# Patient Record
Sex: Female | Born: 1935 | Race: White | Hispanic: No | Marital: Married | Smoking: Never smoker
Health system: Southern US, Community
[De-identification: ages and names within clinical notes are randomized; demographics above are authoritative.]

## PROBLEM LIST (undated history)

## (undated) DIAGNOSIS — L439 Lichen planus, unspecified: Secondary | ICD-10-CM

## (undated) DIAGNOSIS — R0789 Other chest pain: Secondary | ICD-10-CM

## (undated) DIAGNOSIS — M810 Age-related osteoporosis without current pathological fracture: Secondary | ICD-10-CM

## (undated) DIAGNOSIS — C4492 Squamous cell carcinoma of skin, unspecified: Secondary | ICD-10-CM

## (undated) DIAGNOSIS — I341 Nonrheumatic mitral (valve) prolapse: Secondary | ICD-10-CM

## (undated) HISTORY — PX: NOSE SURGERY: SHX723

## (undated) HISTORY — DX: Age-related osteoporosis without current pathological fracture: M81.0

## (undated) HISTORY — DX: Nonrheumatic mitral (valve) prolapse: I34.1

## (undated) HISTORY — DX: Other chest pain: R07.89

## (undated) HISTORY — DX: Lichen planus, unspecified: L43.9

## (undated) HISTORY — DX: Squamous cell carcinoma of skin, unspecified: C44.92

---

## 1998-07-07 ENCOUNTER — Other Ambulatory Visit: Admission: RE | Admit: 1998-07-07 | Discharge: 1998-07-07 | Payer: Self-pay | Admitting: Internal Medicine

## 1999-08-10 ENCOUNTER — Other Ambulatory Visit: Admission: RE | Admit: 1999-08-10 | Discharge: 1999-08-10 | Payer: Self-pay | Admitting: Internal Medicine

## 2003-12-21 ENCOUNTER — Ambulatory Visit: Payer: Self-pay | Admitting: Internal Medicine

## 2004-06-20 ENCOUNTER — Ambulatory Visit: Payer: Self-pay | Admitting: Internal Medicine

## 2004-07-18 ENCOUNTER — Ambulatory Visit: Payer: Self-pay | Admitting: Internal Medicine

## 2004-08-29 ENCOUNTER — Ambulatory Visit: Payer: Self-pay | Admitting: Internal Medicine

## 2004-09-26 ENCOUNTER — Ambulatory Visit: Payer: Self-pay | Admitting: Internal Medicine

## 2004-10-11 ENCOUNTER — Ambulatory Visit: Payer: Self-pay | Admitting: Cardiology

## 2004-10-17 ENCOUNTER — Ambulatory Visit: Payer: Self-pay | Admitting: Cardiology

## 2004-11-22 ENCOUNTER — Ambulatory Visit: Payer: Self-pay | Admitting: Internal Medicine

## 2004-11-23 ENCOUNTER — Ambulatory Visit: Payer: Self-pay | Admitting: Internal Medicine

## 2004-11-27 ENCOUNTER — Ambulatory Visit: Payer: Self-pay | Admitting: Internal Medicine

## 2004-11-27 ENCOUNTER — Ambulatory Visit (HOSPITAL_COMMUNITY): Admission: RE | Admit: 2004-11-27 | Discharge: 2004-11-27 | Payer: Self-pay | Admitting: Internal Medicine

## 2004-12-16 ENCOUNTER — Ambulatory Visit: Payer: Self-pay | Admitting: Internal Medicine

## 2005-01-01 ENCOUNTER — Ambulatory Visit: Payer: Self-pay | Admitting: Internal Medicine

## 2005-02-14 ENCOUNTER — Ambulatory Visit: Payer: Self-pay | Admitting: Internal Medicine

## 2005-02-26 ENCOUNTER — Encounter: Admission: RE | Admit: 2005-02-26 | Discharge: 2005-02-26 | Payer: Self-pay | Admitting: Internal Medicine

## 2005-02-26 ENCOUNTER — Ambulatory Visit: Payer: Self-pay | Admitting: Internal Medicine

## 2005-03-09 ENCOUNTER — Ambulatory Visit: Payer: Self-pay | Admitting: Internal Medicine

## 2005-03-13 ENCOUNTER — Ambulatory Visit: Payer: Self-pay | Admitting: Internal Medicine

## 2005-03-16 ENCOUNTER — Ambulatory Visit: Payer: Self-pay | Admitting: Internal Medicine

## 2005-03-21 ENCOUNTER — Ambulatory Visit: Payer: Self-pay | Admitting: Internal Medicine

## 2005-04-25 ENCOUNTER — Ambulatory Visit: Payer: Self-pay | Admitting: Internal Medicine

## 2005-05-04 ENCOUNTER — Ambulatory Visit: Payer: Self-pay | Admitting: Internal Medicine

## 2005-07-04 ENCOUNTER — Ambulatory Visit: Payer: Self-pay | Admitting: Internal Medicine

## 2005-07-30 ENCOUNTER — Ambulatory Visit: Payer: Self-pay | Admitting: Emergency Medicine

## 2005-10-05 ENCOUNTER — Ambulatory Visit: Payer: Self-pay | Admitting: Internal Medicine

## 2006-06-20 ENCOUNTER — Encounter: Admission: RE | Admit: 2006-06-20 | Discharge: 2006-06-20 | Payer: Self-pay | Admitting: Internal Medicine

## 2006-10-29 DIAGNOSIS — I498 Other specified cardiac arrhythmias: Secondary | ICD-10-CM

## 2006-10-29 DIAGNOSIS — M899 Disorder of bone, unspecified: Secondary | ICD-10-CM | POA: Insufficient documentation

## 2006-10-29 DIAGNOSIS — M949 Disorder of cartilage, unspecified: Secondary | ICD-10-CM

## 2006-10-29 DIAGNOSIS — I10 Essential (primary) hypertension: Secondary | ICD-10-CM | POA: Insufficient documentation

## 2006-10-29 DIAGNOSIS — R350 Frequency of micturition: Secondary | ICD-10-CM | POA: Insufficient documentation

## 2006-10-29 DIAGNOSIS — G43909 Migraine, unspecified, not intractable, without status migrainosus: Secondary | ICD-10-CM | POA: Insufficient documentation

## 2006-10-29 DIAGNOSIS — G4700 Insomnia, unspecified: Secondary | ICD-10-CM

## 2006-11-05 ENCOUNTER — Ambulatory Visit: Payer: Self-pay

## 2007-11-20 ENCOUNTER — Ambulatory Visit: Payer: Self-pay | Admitting: Cardiovascular Disease

## 2007-12-01 ENCOUNTER — Ambulatory Visit: Payer: Self-pay

## 2007-12-01 ENCOUNTER — Encounter: Payer: Self-pay | Admitting: Cardiovascular Disease

## 2010-06-20 NOTE — Assessment & Plan Note (Signed)
[facility]                            CARDIOLOGY OFFICE NOTE   Alison Rivera, Alison Rivera                        MRN:          [id number]  DATE:11/20/2007                            DOB:          1931-12-19    HISTORY OF PRESENT ILLNESS:  Alison Rivera is a 77 year old patient  previous seen by Dr. Daleen Rivera in 2006.  She was referred for chest pain.  Pain is atypical.  She has had it for months to years.  She had a  previous workup by Dr. Daleen Rivera.  Her last Myoview in September 2008 was  normal without ischemia or infarction.  This was a direct referral from  Dr. Sherryll Rivera.  Her previous Myoview in 2006 was also normal as was one in  2004.   The patient has a history of mitral valve prolapse.  This was documented  by echo in 2005.  There was trace MR, and there was only mild anterior  leaflet prolapse.   Chest tightness, this central and it is nonexertional and it is a  squeezing-type pain.  There is no associated shortness of breath or  diaphoresis.  It really has not changed much in character over the  years.   The patient does seem a bit anxious.   She also complains in review of systems of a 6 to 8 pound weight loss.   However, reviewing my records, she weighed about the same at 114 and 117  pounds as far back as September 2006.  Her appetite has been good.  She  has had no nausea and vomiting.  There has been no history of reflux.   PAST MEDICAL HISTORY:  Otherwise, remarkable for tonsil and  adenoidectomy.  She is postmenopausal, borderline hypertension, history  of PACs, and mitral valve prolapse.  Previous history of chest pain with  negative Myoview x3.  History of anxiety and depression.   ALLERGIES:  The patient denies any allergies.   MEDICATIONS:  She is currently taking  1. Toprol 25 a day.  2. Ambien.  3. Nasacort.  4. Calcium.  5. Magnesium.  6. Aspirin.   FAMILY HISTORY:  Mother died at age 16 of Alzheimer's.  Father died at  age 16 of  heart failure.   PAST SURGICAL HISTORY:  Her only previous surgeries included  tonsillectomy in 1945 and nasal surgery in 1990.  She is retired.  She  is married.  Her husband lives with her.  She has 5 grandchildren and 2  older children.  She walks on a regular basis.   PHYSICAL EXAMINATION:  GENERAL:  Remarkable for a thin elderly, white  female, in no distress.  VITAL SIGNS:  Weight is 114, blood pressure 120/70, pulse 60 and  regular, respiratory rate 14, afebrile.  HEENT:  Unremarkable.  NECK:  Carotids are normal without bruit.  No lymphadenopathy,  thyromegaly, or JVP elevation.  LUNGS:  Clear.  Good diaphragmatic motion.  No wheezing.  S1 and S2  without a click.  PMI normal.  ABDOMEN:  Benign.  Bowel sounds positive.  No AAA, no tenderness, no  bruit, no hepatosplenomegaly, no hepatojugular reflux, no tenderness.  EXTREMITIES:  Distal pulses are intact.  No edema.  NEURO:  Nonfocal.  SKIN:  Warm and dry.  No muscular weakness.   EKG from Dr. Margaretmary Rivera office shows normal rhythm with a borderline LVH and  left axis deviation.   IMPRESSION:  1. Atypical chest pain.  Followup stress echocardiogram.  2. History of mitral valve prolapse.  No significant click.  Mitral      valve will be reassessed during stress echocardiogram.  3. History of palpitations, benign.  Continue low-dose beta-blocker  4. Anxiety and depression.  Continue p.r.n. Valium.  5. Overall, I think the patient's symptoms are atypical.  She has had      multiple normal stress tests in the past as long as her stress echo      is normal.  I will see her back in a year.     Alison Pick. Eden Emms, MD, [facility]  Electronically Signed    PCN/MedQ  DD: 11/20/2007  DT: 11/21/2007  Job #: [id number]

## 2011-05-09 ENCOUNTER — Other Ambulatory Visit: Payer: Self-pay | Admitting: Dermatology

## 2011-05-09 DIAGNOSIS — L570 Actinic keratosis: Secondary | ICD-10-CM | POA: Diagnosis not present

## 2011-05-09 DIAGNOSIS — C44721 Squamous cell carcinoma of skin of unspecified lower limb, including hip: Secondary | ICD-10-CM | POA: Diagnosis not present

## 2011-06-14 ENCOUNTER — Other Ambulatory Visit: Payer: Self-pay | Admitting: Dermatology

## 2011-06-14 DIAGNOSIS — L280 Lichen simplex chronicus: Secondary | ICD-10-CM | POA: Diagnosis not present

## 2011-06-14 DIAGNOSIS — C44721 Squamous cell carcinoma of skin of unspecified lower limb, including hip: Secondary | ICD-10-CM | POA: Diagnosis not present

## 2011-08-15 ENCOUNTER — Other Ambulatory Visit: Payer: Self-pay | Admitting: Dermatology

## 2011-08-15 DIAGNOSIS — C44721 Squamous cell carcinoma of skin of unspecified lower limb, including hip: Secondary | ICD-10-CM | POA: Diagnosis not present

## 2011-08-15 DIAGNOSIS — Z85828 Personal history of other malignant neoplasm of skin: Secondary | ICD-10-CM | POA: Diagnosis not present

## 2011-08-15 DIAGNOSIS — L280 Lichen simplex chronicus: Secondary | ICD-10-CM | POA: Diagnosis not present

## 2011-09-19 DIAGNOSIS — C4492 Squamous cell carcinoma of skin, unspecified: Secondary | ICD-10-CM | POA: Diagnosis not present

## 2012-03-03 DIAGNOSIS — I10 Essential (primary) hypertension: Secondary | ICD-10-CM | POA: Diagnosis not present

## 2012-03-03 DIAGNOSIS — M810 Age-related osteoporosis without current pathological fracture: Secondary | ICD-10-CM | POA: Diagnosis not present

## 2012-03-03 DIAGNOSIS — R82998 Other abnormal findings in urine: Secondary | ICD-10-CM | POA: Diagnosis not present

## 2012-03-10 DIAGNOSIS — Z23 Encounter for immunization: Secondary | ICD-10-CM | POA: Diagnosis not present

## 2012-03-10 DIAGNOSIS — I059 Rheumatic mitral valve disease, unspecified: Secondary | ICD-10-CM | POA: Diagnosis not present

## 2012-03-10 DIAGNOSIS — Z0000 Encounter for general adult medical examination without abnormal findings: Secondary | ICD-10-CM | POA: Diagnosis not present

## 2012-03-10 DIAGNOSIS — Z1331 Encounter for screening for depression: Secondary | ICD-10-CM | POA: Diagnosis not present

## 2012-03-10 DIAGNOSIS — I10 Essential (primary) hypertension: Secondary | ICD-10-CM | POA: Diagnosis not present

## 2012-04-02 DIAGNOSIS — L259 Unspecified contact dermatitis, unspecified cause: Secondary | ICD-10-CM | POA: Diagnosis not present

## 2012-04-02 DIAGNOSIS — Z85828 Personal history of other malignant neoplasm of skin: Secondary | ICD-10-CM | POA: Diagnosis not present

## 2012-04-23 ENCOUNTER — Other Ambulatory Visit: Payer: Self-pay | Admitting: Dermatology

## 2012-04-23 DIAGNOSIS — D485 Neoplasm of uncertain behavior of skin: Secondary | ICD-10-CM | POA: Diagnosis not present

## 2012-04-23 DIAGNOSIS — C44721 Squamous cell carcinoma of skin of unspecified lower limb, including hip: Secondary | ICD-10-CM | POA: Diagnosis not present

## 2012-04-23 DIAGNOSIS — L280 Lichen simplex chronicus: Secondary | ICD-10-CM | POA: Diagnosis not present

## 2012-06-09 DIAGNOSIS — H43819 Vitreous degeneration, unspecified eye: Secondary | ICD-10-CM | POA: Diagnosis not present

## 2012-06-09 DIAGNOSIS — H5200 Hypermetropia, unspecified eye: Secondary | ICD-10-CM | POA: Diagnosis not present

## 2012-06-09 DIAGNOSIS — H524 Presbyopia: Secondary | ICD-10-CM | POA: Diagnosis not present

## 2012-06-09 DIAGNOSIS — H259 Unspecified age-related cataract: Secondary | ICD-10-CM | POA: Diagnosis not present

## 2012-06-24 DIAGNOSIS — H6120 Impacted cerumen, unspecified ear: Secondary | ICD-10-CM | POA: Diagnosis not present

## 2012-06-26 DIAGNOSIS — Z85828 Personal history of other malignant neoplasm of skin: Secondary | ICD-10-CM | POA: Diagnosis not present

## 2012-06-26 DIAGNOSIS — L280 Lichen simplex chronicus: Secondary | ICD-10-CM | POA: Diagnosis not present

## 2012-07-01 DIAGNOSIS — H25019 Cortical age-related cataract, unspecified eye: Secondary | ICD-10-CM | POA: Diagnosis not present

## 2012-07-01 DIAGNOSIS — H2510 Age-related nuclear cataract, unspecified eye: Secondary | ICD-10-CM | POA: Diagnosis not present

## 2012-07-01 DIAGNOSIS — H269 Unspecified cataract: Secondary | ICD-10-CM | POA: Diagnosis not present

## 2012-07-22 DIAGNOSIS — H25049 Posterior subcapsular polar age-related cataract, unspecified eye: Secondary | ICD-10-CM | POA: Diagnosis not present

## 2012-07-22 DIAGNOSIS — H269 Unspecified cataract: Secondary | ICD-10-CM | POA: Diagnosis not present

## 2012-07-22 DIAGNOSIS — H2510 Age-related nuclear cataract, unspecified eye: Secondary | ICD-10-CM | POA: Diagnosis not present

## 2012-07-22 DIAGNOSIS — H25019 Cortical age-related cataract, unspecified eye: Secondary | ICD-10-CM | POA: Diagnosis not present

## 2013-01-07 DIAGNOSIS — Z85828 Personal history of other malignant neoplasm of skin: Secondary | ICD-10-CM | POA: Diagnosis not present

## 2013-01-07 DIAGNOSIS — L570 Actinic keratosis: Secondary | ICD-10-CM | POA: Diagnosis not present

## 2013-01-07 DIAGNOSIS — L280 Lichen simplex chronicus: Secondary | ICD-10-CM | POA: Diagnosis not present

## 2013-03-06 DIAGNOSIS — R809 Proteinuria, unspecified: Secondary | ICD-10-CM | POA: Diagnosis not present

## 2013-03-06 DIAGNOSIS — I10 Essential (primary) hypertension: Secondary | ICD-10-CM | POA: Diagnosis not present

## 2013-03-06 DIAGNOSIS — M810 Age-related osteoporosis without current pathological fracture: Secondary | ICD-10-CM | POA: Diagnosis not present

## 2013-03-13 DIAGNOSIS — M810 Age-related osteoporosis without current pathological fracture: Secondary | ICD-10-CM | POA: Diagnosis not present

## 2013-03-13 DIAGNOSIS — Z23 Encounter for immunization: Secondary | ICD-10-CM | POA: Diagnosis not present

## 2013-03-13 DIAGNOSIS — R0789 Other chest pain: Secondary | ICD-10-CM | POA: Diagnosis not present

## 2013-03-13 DIAGNOSIS — Z1331 Encounter for screening for depression: Secondary | ICD-10-CM | POA: Diagnosis not present

## 2013-03-13 DIAGNOSIS — I10 Essential (primary) hypertension: Secondary | ICD-10-CM | POA: Diagnosis not present

## 2013-03-13 DIAGNOSIS — F429 Obsessive-compulsive disorder, unspecified: Secondary | ICD-10-CM | POA: Diagnosis not present

## 2013-03-13 DIAGNOSIS — F411 Generalized anxiety disorder: Secondary | ICD-10-CM | POA: Diagnosis not present

## 2013-03-13 DIAGNOSIS — Z0000 Encounter for general adult medical examination without abnormal findings: Secondary | ICD-10-CM | POA: Diagnosis not present

## 2013-03-30 ENCOUNTER — Ambulatory Visit (INDEPENDENT_AMBULATORY_CARE_PROVIDER_SITE_OTHER): Payer: Medicare Other | Admitting: Cardiology

## 2013-03-30 ENCOUNTER — Encounter: Payer: Self-pay | Admitting: Cardiology

## 2013-03-30 VITALS — BP 140/66 | HR 63 | Ht 64.0 in | Wt 123.0 lb

## 2013-03-30 DIAGNOSIS — R0789 Other chest pain: Secondary | ICD-10-CM

## 2013-03-30 DIAGNOSIS — R011 Cardiac murmur, unspecified: Secondary | ICD-10-CM | POA: Diagnosis not present

## 2013-03-30 DIAGNOSIS — I059 Rheumatic mitral valve disease, unspecified: Secondary | ICD-10-CM | POA: Diagnosis not present

## 2013-03-30 DIAGNOSIS — M810 Age-related osteoporosis without current pathological fracture: Secondary | ICD-10-CM | POA: Insufficient documentation

## 2013-03-30 DIAGNOSIS — I10 Essential (primary) hypertension: Secondary | ICD-10-CM | POA: Diagnosis not present

## 2013-03-30 DIAGNOSIS — Z1231 Encounter for screening mammogram for malignant neoplasm of breast: Secondary | ICD-10-CM | POA: Diagnosis not present

## 2013-03-30 DIAGNOSIS — I341 Nonrheumatic mitral (valve) prolapse: Secondary | ICD-10-CM | POA: Insufficient documentation

## 2013-03-30 DIAGNOSIS — F429 Obsessive-compulsive disorder, unspecified: Secondary | ICD-10-CM | POA: Insufficient documentation

## 2013-03-30 DIAGNOSIS — F419 Anxiety disorder, unspecified: Secondary | ICD-10-CM | POA: Insufficient documentation

## 2013-03-30 NOTE — Progress Notes (Signed)
[place]. [address], [address] [place], [place]  [address] Phone: [phone] Fax:  [phone]  Date:  03/30/2013   ID:  Alison Rivera, DOB 24-Jan-1932, MRN [id number]  PCP:  No primary provider on file.   History of Present Illness: Alison Rivera is a 80 y.o. female Here for the evaluation of atypical chest pain.had prior stress echocardiogram on 12/01/07 where she exercised for over 7 minutes and had no echo changes.  Gets so exhausted she says. Sometimes feels like feeling you get when you play in the cold as a child-a burning-like sensation in her chest. Different activities makes these symptoms arise. Stairs bother her. Symptoms resolve after 20-38min. No radiation. Very rare palpitations. Since Toprol no longer feels palpations.    Wt Readings from Last 3 Encounters:  03/30/13 123 lb (55.792 kg)     Past Medical History  Diagnosis Date  . Chest pain, atypical   . Lichen planus   . Mitral valve prolapse   . Osteoporosis   . Squamous cell skin cancer     Past Surgical History  Procedure Laterality Date  . Nose surgery      Current Outpatient Prescriptions  Medication Sig Dispense Refill  . calcium carbonate (OS-CAL) 600 MG TABS tablet Take 600 mg by mouth 2 (two) times daily with a meal.      . Cholecalciferol (HM VITAMIN D3) 4000 UNITS CAPS Take 1 capsule by mouth daily.      . hydrOXYzine (ATARAX/VISTARIL) 25 MG tablet Take 25 mg by mouth every 8 (eight) hours as needed for itching.      . metoprolol succinate (TOPROL-XL) 25 MG 24 hr tablet Take 25 mg by mouth daily.      . Multiple Vitamins-Minerals (CENTRUM SILVER) tablet Take 1 tablet by mouth daily.      Marland Kitchen zolpidem (AMBIEN) 10 MG tablet Take 5 mg by mouth at bedtime as needed for sleep.       No current facility-administered medications for this visit.    Allergies:   Not on File  Social History:  The patient  reports that she has never smoked. She does not have any smokeless tobacco history on file. She  reports that she drinks about 3.0 ounces of alcohol per week. She reports that she does not use illicit drugs.   Family History  Problem Relation Age of Onset  . Other Mother     AD  . Congestive Heart Failure Father   . Syncope episode Father   . Parkinson's disease Brother   . Other Brother     Deep Brain Stimulator  She remembers her father having issues with syncope at times.  ROS:  Please see the history of present illness.   Denies any strokelike symptoms, fevers, chills, orthopnea, PND, syncope. No bleeding.   All other systems reviewed and negative.   PHYSICAL EXAM: VS:  BP 140/66  Pulse 63  Ht 5\' 4"  (1.626 m)  Wt 123 lb (55.792 kg)  BMI 21.10 kg/m2 Well nourished, well developed, in no acute distressthin. Looks younger than stated age. HEENT: normal, [place]/AT, EOMI Neck: no JVD, normal carotid upstroke, no bruit Cardiac:  normal S1, S2; RRR; no murmur Lungs:  clear to auscultation bilaterally, no wheezing, rhonchi or rales Abd: soft, nontender, no hepatomegaly, no bruits Ext: no edema, 2+ distal pulses Skin: warm and dry GU: deferred Neuro: no focal abnormalities noted, AAO x 3  EKG:  Sinus rhythm with first degree  AV block, PR interval 230 ms, borderline LVH pattern. Nonspecific ST-T wave changes. Borderline septal Q waves. Poor R wave progression. No significant change from prior EKG.    ASSESSMENT AND PLAN:  1. Chest discomfort-exertional, could be angina. I will check a nuclear stress test. 2. Heart murmur-I will check an echocardiogram. Previous diagnosis of mitral valve prolapse. I want to make sure that she does not have severe mitral regurgitation which could also be causing symptoms. 3. Hypertension currently well controlled. Metoprolol. 4. Abnormal EKG-possible septal Q waves, poor R wave progression. Checking echocardiogram, stress test. 5. If cardiac workup unremarkable, continue to monitor symptoms. Reassurance.  Signed, Candee Furbish, MD [facility]  03/30/2013  3:11 PM

## 2013-03-30 NOTE — Patient Instructions (Signed)
Your physician recommends that you continue on your current medications as directed. Please refer to the Current Medication list given to you today.  Your physician has requested that you have an echocardiogram. Echocardiography is a painless test that uses sound waves to create images of your heart. It provides your doctor with information about the size and shape of your heart and how well your heart's chambers and valves are working. This procedure takes approximately one hour. There are no restrictions for this procedure.  Your physician has requested that you have a lexiscan myoview. For further information please visit [email or web address]. Please follow instruction sheet, as given.  Your physician recommends that you schedule a follow-up appointment as needed 

## 2013-04-09 ENCOUNTER — Encounter: Payer: Self-pay | Admitting: Cardiology

## 2013-04-09 ENCOUNTER — Encounter: Payer: Self-pay | Admitting: Cardiovascular Disease

## 2013-04-15 ENCOUNTER — Ambulatory Visit (HOSPITAL_COMMUNITY): Payer: Medicare Other | Attending: Cardiovascular Disease | Admitting: Radiology

## 2013-04-15 ENCOUNTER — Ambulatory Visit (HOSPITAL_BASED_OUTPATIENT_CLINIC_OR_DEPARTMENT_OTHER): Payer: Medicare Other | Admitting: Radiology

## 2013-04-15 VITALS — BP 157/78 | HR 61 | Ht 64.0 in | Wt 121.0 lb

## 2013-04-15 DIAGNOSIS — R0789 Other chest pain: Secondary | ICD-10-CM | POA: Insufficient documentation

## 2013-04-15 DIAGNOSIS — I10 Essential (primary) hypertension: Secondary | ICD-10-CM | POA: Insufficient documentation

## 2013-04-15 DIAGNOSIS — R079 Chest pain, unspecified: Secondary | ICD-10-CM

## 2013-04-15 DIAGNOSIS — R072 Precordial pain: Secondary | ICD-10-CM | POA: Diagnosis not present

## 2013-04-15 MED ORDER — TECHNETIUM TC 99M SESTAMIBI GENERIC - CARDIOLITE
33.0000 | Freq: Once | INTRAVENOUS | Status: AC | PRN
  Administered 2013-04-15: 33 via INTRAVENOUS

## 2013-04-15 MED ORDER — TECHNETIUM TC 99M SESTAMIBI GENERIC - CARDIOLITE
10.8000 | Freq: Once | INTRAVENOUS | Status: AC | PRN
  Administered 2013-04-15: 11 via INTRAVENOUS

## 2013-04-15 MED ORDER — REGADENOSON 0.4 MG/5ML IV SOLN
0.4000 mg | Freq: Once | INTRAVENOUS | Status: AC
  Administered 2013-04-15: 0.4 mg via INTRAVENOUS

## 2013-04-15 NOTE — Progress Notes (Signed)
Echocardiogram performed.  

## 2013-04-15 NOTE — Progress Notes (Signed)
[facility] [address] NUCLEAR MED [address] [place], [place] [address] [phone]    Cardiology Nuclear Med Study  Alison Rivera is a 79 y.o. female     MRN : [id number]     DOB: 04/27/1931  Procedure Date: 04/15/2013  Nuclear Med Background Indication for Stress Test:  Evaluation for Ischemia and Abnormal EKG History:  No known CAD, Hx. MVP, MPI 2008 (normal) EF 77%, Stress Echo 2009 (normal) Cardiac Risk Factors: Hypertension  Symptoms:  Chest Pain (last date of chest discomfort was one week ago)   Nuclear Pre-Procedure Caffeine/Decaff Intake:  None > 12 hrs NPO After: 7:30am   Lungs:  clear O2 Sat: 98% on room air. IV 0.9% NS with Angio Cath:  22g  IV Site: R Antecubital x 1, tolerated well IV Started by:  Irven Baltimore, RN  Chest Size (in):  34 Cup Size: B  Height: 5\' 4"  (1.626 m)  Weight:  121 lb (54.885 kg)  BMI:  Body mass index is 20.76 kg/(m^2). Tech Comments:  Held Toprol x 24 hrs, and no medications today per patient    Nuclear Med Study 1 or 2 day study: 1 day  Stress Test Type:  Treadmill/Lexiscan  Reading MD: N/A  Order Authorizing Provider:  Candee Furbish, MD  Resting Radionuclide: Technetium 31m Sestamibi  Resting Radionuclide Dose: 11.0 mCi   Stress Radionuclide:  Technetium 85m Sestamibi  Stress Radionuclide Dose: 33.0 mCi           Stress Protocol Rest HR: 61 Stress HR: 111  Rest BP: 157/78 Stress BP: 162/73  Exercise Time (min): n/a METS: n/a           Dose of Adenosine (mg):  n/a Dose of Lexiscan: 0.4 mg  Dose of Atropine (mg): n/a Dose of Dobutamine: n/a mcg/kg/min (at max HR)  Stress Test Technologist: Glade Lloyd, BS-ES  Nuclear Technologist:  Charlton Amor, CNMT     Rest Procedure:  Myocardial perfusion imaging was performed at rest 45 minutes following the intravenous administration of Technetium 49m Sestamibi. Rest ECG: NSR - Normal EKG  Stress Procedure:  The patient received IV Lexiscan 0.4 mg over 15-seconds with  concurrent low level exercise and then Technetium 34m Sestamibi was injected at 30-seconds while the patient continued walking one more minute.  Quantitative spect images were obtained after a 45-minute delay.  During the infusion of Lexiscan, the patient complained of SOB and a weak feeling.  This began to resolve in recovery.  Stress ECG: No significant change from baseline ECG  QPS Raw Data Images:  Normal; no motion artifact; normal heart/lung ratio. Stress Images:  Normal homogeneous uptake in all areas of the myocardium. Rest Images:  Normal homogeneous uptake in all areas of the myocardium. Subtraction (SDS):  No evidence of ischemia. Transient Ischemic Dilatation (Normal <1.22):  1.03 Lung/Heart Ratio (Normal <0.45):  0.30  Quantitative Gated Spect Images QGS EDV:  76 ml QGS ESV:  23 ml  Impression Exercise Capacity:  Lexiscan with low level exercise. BP Response:  Normal blood pressure response. Clinical Symptoms:  No significant symptoms noted. ECG Impression:  No significant ST segment change suggestive of ischemia. Comparison with Prior Nuclear Study: No images to compare  Overall Impression:  Normal stress nuclear study.  LV Ejection Fraction: 70%.  LV Wall Motion:  NL LV Function; NL Wall Motion  Sanda Klein, MD, [facility] [phone] office [phone] pager

## 2013-04-21 ENCOUNTER — Telehealth: Payer: Self-pay | Admitting: Cardiology

## 2013-04-21 NOTE — Telephone Encounter (Signed)
Follow up  Pt called// Pt requesting a call back//SR

## 2013-04-24 NOTE — Telephone Encounter (Signed)
Patient was unable to get onto the [facility] mychart website, spouse advised that someone called to assit and they are now able to get on the site.

## 2013-05-05 ENCOUNTER — Telehealth: Payer: Self-pay | Admitting: Cardiology

## 2013-05-05 DIAGNOSIS — M810 Age-related osteoporosis without current pathological fracture: Secondary | ICD-10-CM | POA: Diagnosis not present

## 2013-05-05 NOTE — Telephone Encounter (Signed)
New message  ° ° ° °rtn call back to nurse.   °

## 2013-05-05 NOTE — Telephone Encounter (Signed)
Patient called for results, contacted ;atient and advised of results

## 2013-05-14 DIAGNOSIS — Z1212 Encounter for screening for malignant neoplasm of rectum: Secondary | ICD-10-CM | POA: Diagnosis not present

## 2013-08-28 DIAGNOSIS — H5210 Myopia, unspecified eye: Secondary | ICD-10-CM | POA: Diagnosis not present

## 2013-08-28 DIAGNOSIS — H2640 Unspecified secondary cataract: Secondary | ICD-10-CM | POA: Diagnosis not present

## 2013-08-28 DIAGNOSIS — H524 Presbyopia: Secondary | ICD-10-CM | POA: Diagnosis not present

## 2013-08-28 DIAGNOSIS — Z961 Presence of intraocular lens: Secondary | ICD-10-CM | POA: Diagnosis not present

## 2013-12-02 DIAGNOSIS — Z23 Encounter for immunization: Secondary | ICD-10-CM | POA: Diagnosis not present

## 2014-03-10 DIAGNOSIS — L281 Prurigo nodularis: Secondary | ICD-10-CM | POA: Diagnosis not present

## 2014-03-10 DIAGNOSIS — L821 Other seborrheic keratosis: Secondary | ICD-10-CM | POA: Diagnosis not present

## 2014-03-10 DIAGNOSIS — L814 Other melanin hyperpigmentation: Secondary | ICD-10-CM | POA: Diagnosis not present

## 2014-03-10 DIAGNOSIS — D225 Melanocytic nevi of trunk: Secondary | ICD-10-CM | POA: Diagnosis not present

## 2014-03-10 DIAGNOSIS — D1801 Hemangioma of skin and subcutaneous tissue: Secondary | ICD-10-CM | POA: Diagnosis not present

## 2014-03-10 DIAGNOSIS — Z85828 Personal history of other malignant neoplasm of skin: Secondary | ICD-10-CM | POA: Diagnosis not present

## 2014-03-19 DIAGNOSIS — I10 Essential (primary) hypertension: Secondary | ICD-10-CM | POA: Diagnosis not present

## 2014-03-19 DIAGNOSIS — R8299 Other abnormal findings in urine: Secondary | ICD-10-CM | POA: Diagnosis not present

## 2014-03-19 DIAGNOSIS — Z0000 Encounter for general adult medical examination without abnormal findings: Secondary | ICD-10-CM | POA: Diagnosis not present

## 2014-03-19 DIAGNOSIS — M810 Age-related osteoporosis without current pathological fracture: Secondary | ICD-10-CM | POA: Diagnosis not present

## 2014-03-26 DIAGNOSIS — I341 Nonrheumatic mitral (valve) prolapse: Secondary | ICD-10-CM | POA: Diagnosis not present

## 2014-03-26 DIAGNOSIS — F42 Obsessive-compulsive disorder: Secondary | ICD-10-CM | POA: Diagnosis not present

## 2014-03-26 DIAGNOSIS — Z0000 Encounter for general adult medical examination without abnormal findings: Secondary | ICD-10-CM | POA: Diagnosis not present

## 2014-03-26 DIAGNOSIS — Z1389 Encounter for screening for other disorder: Secondary | ICD-10-CM | POA: Diagnosis not present

## 2014-03-26 DIAGNOSIS — Z6822 Body mass index (BMI) 22.0-22.9, adult: Secondary | ICD-10-CM | POA: Diagnosis not present

## 2014-03-26 DIAGNOSIS — M810 Age-related osteoporosis without current pathological fracture: Secondary | ICD-10-CM | POA: Diagnosis not present

## 2014-03-26 DIAGNOSIS — I10 Essential (primary) hypertension: Secondary | ICD-10-CM | POA: Diagnosis not present

## 2014-03-26 DIAGNOSIS — F419 Anxiety disorder, unspecified: Secondary | ICD-10-CM | POA: Diagnosis not present

## 2014-03-26 DIAGNOSIS — R251 Tremor, unspecified: Secondary | ICD-10-CM | POA: Diagnosis not present

## 2014-08-02 ENCOUNTER — Other Ambulatory Visit: Payer: Self-pay

## 2015-03-28 DIAGNOSIS — N390 Urinary tract infection, site not specified: Secondary | ICD-10-CM | POA: Diagnosis not present

## 2015-03-28 DIAGNOSIS — I10 Essential (primary) hypertension: Secondary | ICD-10-CM | POA: Diagnosis not present

## 2015-03-28 DIAGNOSIS — R8290 Unspecified abnormal findings in urine: Secondary | ICD-10-CM | POA: Diagnosis not present

## 2015-03-28 DIAGNOSIS — M810 Age-related osteoporosis without current pathological fracture: Secondary | ICD-10-CM | POA: Diagnosis not present

## 2015-04-04 DIAGNOSIS — Z6822 Body mass index (BMI) 22.0-22.9, adult: Secondary | ICD-10-CM | POA: Diagnosis not present

## 2015-04-04 DIAGNOSIS — M810 Age-related osteoporosis without current pathological fracture: Secondary | ICD-10-CM | POA: Diagnosis not present

## 2015-04-04 DIAGNOSIS — R251 Tremor, unspecified: Secondary | ICD-10-CM | POA: Diagnosis not present

## 2015-04-04 DIAGNOSIS — Z1389 Encounter for screening for other disorder: Secondary | ICD-10-CM | POA: Diagnosis not present

## 2015-04-04 DIAGNOSIS — F428 Other obsessive-compulsive disorder: Secondary | ICD-10-CM | POA: Diagnosis not present

## 2015-04-04 DIAGNOSIS — Z0000 Encounter for general adult medical examination without abnormal findings: Secondary | ICD-10-CM | POA: Diagnosis not present

## 2015-04-04 DIAGNOSIS — F418 Other specified anxiety disorders: Secondary | ICD-10-CM | POA: Diagnosis not present

## 2015-04-04 DIAGNOSIS — M79646 Pain in unspecified finger(s): Secondary | ICD-10-CM | POA: Diagnosis not present

## 2015-04-04 DIAGNOSIS — L309 Dermatitis, unspecified: Secondary | ICD-10-CM | POA: Diagnosis not present

## 2015-04-04 DIAGNOSIS — I10 Essential (primary) hypertension: Secondary | ICD-10-CM | POA: Diagnosis not present

## 2015-05-11 DIAGNOSIS — L814 Other melanin hyperpigmentation: Secondary | ICD-10-CM | POA: Diagnosis not present

## 2015-05-11 DIAGNOSIS — L570 Actinic keratosis: Secondary | ICD-10-CM | POA: Diagnosis not present

## 2015-05-11 DIAGNOSIS — Z85828 Personal history of other malignant neoplasm of skin: Secondary | ICD-10-CM | POA: Diagnosis not present

## 2015-05-11 DIAGNOSIS — L281 Prurigo nodularis: Secondary | ICD-10-CM | POA: Diagnosis not present

## 2015-05-11 DIAGNOSIS — D692 Other nonthrombocytopenic purpura: Secondary | ICD-10-CM | POA: Diagnosis not present

## 2015-07-21 DIAGNOSIS — Z0100 Encounter for examination of eyes and vision without abnormal findings: Secondary | ICD-10-CM | POA: Diagnosis not present

## 2015-07-21 DIAGNOSIS — H26492 Other secondary cataract, left eye: Secondary | ICD-10-CM | POA: Diagnosis not present

## 2015-11-17 DIAGNOSIS — Z23 Encounter for immunization: Secondary | ICD-10-CM | POA: Diagnosis not present

## 2016-03-30 DIAGNOSIS — R8299 Other abnormal findings in urine: Secondary | ICD-10-CM | POA: Diagnosis not present

## 2016-03-30 DIAGNOSIS — Z0000 Encounter for general adult medical examination without abnormal findings: Secondary | ICD-10-CM | POA: Diagnosis not present

## 2016-03-30 DIAGNOSIS — I10 Essential (primary) hypertension: Secondary | ICD-10-CM | POA: Diagnosis not present

## 2016-03-30 DIAGNOSIS — M810 Age-related osteoporosis without current pathological fracture: Secondary | ICD-10-CM | POA: Diagnosis not present

## 2016-04-06 DIAGNOSIS — F419 Anxiety disorder, unspecified: Secondary | ICD-10-CM | POA: Diagnosis not present

## 2016-04-06 DIAGNOSIS — F428 Other obsessive-compulsive disorder: Secondary | ICD-10-CM | POA: Diagnosis not present

## 2016-04-06 DIAGNOSIS — I10 Essential (primary) hypertension: Secondary | ICD-10-CM | POA: Diagnosis not present

## 2016-04-06 DIAGNOSIS — M810 Age-related osteoporosis without current pathological fracture: Secondary | ICD-10-CM | POA: Diagnosis not present

## 2016-04-06 DIAGNOSIS — Z0000 Encounter for general adult medical examination without abnormal findings: Secondary | ICD-10-CM | POA: Diagnosis not present

## 2016-04-06 DIAGNOSIS — Z1389 Encounter for screening for other disorder: Secondary | ICD-10-CM | POA: Diagnosis not present

## 2016-04-06 DIAGNOSIS — Z6821 Body mass index (BMI) 21.0-21.9, adult: Secondary | ICD-10-CM | POA: Diagnosis not present

## 2016-05-08 DIAGNOSIS — Z6821 Body mass index (BMI) 21.0-21.9, adult: Secondary | ICD-10-CM | POA: Diagnosis not present

## 2016-05-08 DIAGNOSIS — M810 Age-related osteoporosis without current pathological fracture: Secondary | ICD-10-CM | POA: Diagnosis not present

## 2016-05-08 DIAGNOSIS — I10 Essential (primary) hypertension: Secondary | ICD-10-CM | POA: Diagnosis not present

## 2016-11-07 DIAGNOSIS — Z85828 Personal history of other malignant neoplasm of skin: Secondary | ICD-10-CM | POA: Diagnosis not present

## 2016-11-07 DIAGNOSIS — L281 Prurigo nodularis: Secondary | ICD-10-CM | POA: Diagnosis not present

## 2016-11-07 DIAGNOSIS — L814 Other melanin hyperpigmentation: Secondary | ICD-10-CM | POA: Diagnosis not present

## 2016-11-07 DIAGNOSIS — L570 Actinic keratosis: Secondary | ICD-10-CM | POA: Diagnosis not present

## 2016-11-07 DIAGNOSIS — D1801 Hemangioma of skin and subcutaneous tissue: Secondary | ICD-10-CM | POA: Diagnosis not present

## 2016-11-07 DIAGNOSIS — L821 Other seborrheic keratosis: Secondary | ICD-10-CM | POA: Diagnosis not present

## 2016-11-27 DIAGNOSIS — Z23 Encounter for immunization: Secondary | ICD-10-CM | POA: Diagnosis not present

## 2016-12-26 DIAGNOSIS — Z961 Presence of intraocular lens: Secondary | ICD-10-CM | POA: Diagnosis not present

## 2016-12-26 DIAGNOSIS — H524 Presbyopia: Secondary | ICD-10-CM | POA: Diagnosis not present

## 2016-12-26 DIAGNOSIS — H1859 Other hereditary corneal dystrophies: Secondary | ICD-10-CM | POA: Diagnosis not present

## 2017-04-01 DIAGNOSIS — M810 Age-related osteoporosis without current pathological fracture: Secondary | ICD-10-CM | POA: Diagnosis not present

## 2017-04-01 DIAGNOSIS — I10 Essential (primary) hypertension: Secondary | ICD-10-CM | POA: Diagnosis not present

## 2017-04-08 DIAGNOSIS — F418 Other specified anxiety disorders: Secondary | ICD-10-CM | POA: Diagnosis not present

## 2017-04-08 DIAGNOSIS — M810 Age-related osteoporosis without current pathological fracture: Secondary | ICD-10-CM | POA: Diagnosis not present

## 2017-04-08 DIAGNOSIS — F428 Other obsessive-compulsive disorder: Secondary | ICD-10-CM | POA: Diagnosis not present

## 2017-04-08 DIAGNOSIS — Z1389 Encounter for screening for other disorder: Secondary | ICD-10-CM | POA: Diagnosis not present

## 2017-04-08 DIAGNOSIS — I10 Essential (primary) hypertension: Secondary | ICD-10-CM | POA: Diagnosis not present

## 2017-04-08 DIAGNOSIS — Z0000 Encounter for general adult medical examination without abnormal findings: Secondary | ICD-10-CM | POA: Diagnosis not present

## 2017-04-08 DIAGNOSIS — Z6820 Body mass index (BMI) 20.0-20.9, adult: Secondary | ICD-10-CM | POA: Diagnosis not present

## 2017-04-24 DIAGNOSIS — I10 Essential (primary) hypertension: Secondary | ICD-10-CM | POA: Diagnosis not present

## 2017-05-03 DIAGNOSIS — I10 Essential (primary) hypertension: Secondary | ICD-10-CM | POA: Diagnosis not present

## 2017-11-28 DIAGNOSIS — Z23 Encounter for immunization: Secondary | ICD-10-CM | POA: Diagnosis not present

## 2017-12-25 DIAGNOSIS — L280 Lichen simplex chronicus: Secondary | ICD-10-CM | POA: Diagnosis not present

## 2017-12-25 DIAGNOSIS — D692 Other nonthrombocytopenic purpura: Secondary | ICD-10-CM | POA: Diagnosis not present

## 2017-12-25 DIAGNOSIS — L308 Other specified dermatitis: Secondary | ICD-10-CM | POA: Diagnosis not present

## 2017-12-25 DIAGNOSIS — Z85828 Personal history of other malignant neoplasm of skin: Secondary | ICD-10-CM | POA: Diagnosis not present

## 2017-12-25 DIAGNOSIS — L281 Prurigo nodularis: Secondary | ICD-10-CM | POA: Diagnosis not present

## 2017-12-25 DIAGNOSIS — L570 Actinic keratosis: Secondary | ICD-10-CM | POA: Diagnosis not present

## 2017-12-25 DIAGNOSIS — L814 Other melanin hyperpigmentation: Secondary | ICD-10-CM | POA: Diagnosis not present

## 2018-05-13 DIAGNOSIS — I10 Essential (primary) hypertension: Secondary | ICD-10-CM | POA: Diagnosis not present

## 2018-05-13 DIAGNOSIS — M810 Age-related osteoporosis without current pathological fracture: Secondary | ICD-10-CM | POA: Diagnosis not present

## 2018-05-28 DIAGNOSIS — R82998 Other abnormal findings in urine: Secondary | ICD-10-CM | POA: Diagnosis not present

## 2019-03-28 ENCOUNTER — Ambulatory Visit: Payer: Medicare Other | Attending: Internal Medicine

## 2019-03-28 DIAGNOSIS — Z23 Encounter for immunization: Secondary | ICD-10-CM | POA: Insufficient documentation

## 2019-03-28 NOTE — Progress Notes (Signed)
   Covid-19 Vaccination Clinic  Name:  Alison Rivera    MRN: [id number] DOB: 1931/04/16  03/28/2019  [name] was observed post Covid-19 immunization for 15 minutes without incidence. She was provided with Vaccine Information Sheet and instruction to access the V-Safe system.   [name] was instructed to call 911 with any severe reactions post vaccine: Marland Kitchen Difficulty breathing  . Swelling of your face and throat  . A fast heartbeat  . A bad rash all over your body  . Dizziness and weakness    Immunizations Administered    Name Date Dose VIS Date Route   Pfizer COVID-19 Vaccine 03/28/2019 10:23 AM 0.3 mL 01/16/2019 Intramuscular   Manufacturer: [place]   Lot: [id number]   [place]: [id number]

## 2019-04-21 ENCOUNTER — Ambulatory Visit: Payer: Medicare Other | Attending: Internal Medicine

## 2019-04-21 DIAGNOSIS — Z23 Encounter for immunization: Secondary | ICD-10-CM

## 2019-04-21 NOTE — Progress Notes (Signed)
   Covid-19 Vaccination Clinic  Name:  Alison Rivera    MRN: [id number] DOB: [birth year]  04/21/2019  Alison Rivera was observed post Covid-19 immunization for 15 minutes without incident. She was provided with Vaccine Information Sheet and instruction to access the V-Safe system.   Alison Rivera was instructed to call 911 with any severe reactions post vaccine: Marland Kitchen Difficulty breathing  . Swelling of face and throat  . A fast heartbeat  . A bad rash all over body  . Dizziness and weakness   Immunizations Administered    Name Date Dose VIS Date Route   Pfizer COVID-19 Vaccine 04/21/2019 10:39 AM 0.3 mL 01/16/2019 Intramuscular   Manufacturer: [place]   Lot: [id number]   [place]: [id number]

## 2019-05-07 DIAGNOSIS — I10 Essential (primary) hypertension: Secondary | ICD-10-CM | POA: Diagnosis not present

## 2019-05-07 DIAGNOSIS — M810 Age-related osteoporosis without current pathological fracture: Secondary | ICD-10-CM | POA: Diagnosis not present

## 2019-05-15 DIAGNOSIS — I10 Essential (primary) hypertension: Secondary | ICD-10-CM | POA: Diagnosis not present

## 2019-05-15 DIAGNOSIS — R82998 Other abnormal findings in urine: Secondary | ICD-10-CM | POA: Diagnosis not present

## 2019-06-04 DIAGNOSIS — M810 Age-related osteoporosis without current pathological fracture: Secondary | ICD-10-CM | POA: Diagnosis not present

## 2019-06-15 DIAGNOSIS — S0501XA Injury of conjunctiva and corneal abrasion without foreign body, right eye, initial encounter: Secondary | ICD-10-CM | POA: Diagnosis not present

## 2019-06-17 DIAGNOSIS — S0501XD Injury of conjunctiva and corneal abrasion without foreign body, right eye, subsequent encounter: Secondary | ICD-10-CM | POA: Diagnosis not present

## 2019-06-17 DIAGNOSIS — H18591 Other hereditary corneal dystrophies, right eye: Secondary | ICD-10-CM | POA: Diagnosis not present

## 2019-06-19 DIAGNOSIS — S0501XD Injury of conjunctiva and corneal abrasion without foreign body, right eye, subsequent encounter: Secondary | ICD-10-CM | POA: Diagnosis not present

## 2019-06-19 DIAGNOSIS — H18591 Other hereditary corneal dystrophies, right eye: Secondary | ICD-10-CM | POA: Diagnosis not present

## 2019-10-06 DIAGNOSIS — H18593 Other hereditary corneal dystrophies, bilateral: Secondary | ICD-10-CM | POA: Diagnosis not present

## 2019-10-06 DIAGNOSIS — H52203 Unspecified astigmatism, bilateral: Secondary | ICD-10-CM | POA: Diagnosis not present

## 2019-11-10 DIAGNOSIS — Z23 Encounter for immunization: Secondary | ICD-10-CM | POA: Diagnosis not present

## 2020-01-04 ENCOUNTER — Inpatient Hospital Stay: Admit: 2020-01-04 | Payer: Medicare Other | Admitting: Orthopaedic Surgery

## 2020-01-04 ENCOUNTER — Other Ambulatory Visit: Payer: Self-pay

## 2020-01-04 ENCOUNTER — Inpatient Hospital Stay (HOSPITAL_COMMUNITY)
Admission: EM | Admit: 2020-01-04 | Discharge: 2020-01-06 | DRG: 481 | Disposition: A | Discharge status: Rehabilitation Facility | Payer: Medicare Other | Attending: Internal Medicine | Admitting: Internal Medicine

## 2020-01-04 ENCOUNTER — Inpatient Hospital Stay (HOSPITAL_COMMUNITY): Payer: Medicare Other

## 2020-01-04 ENCOUNTER — Encounter (HOSPITAL_COMMUNITY): Payer: Self-pay

## 2020-01-04 ENCOUNTER — Inpatient Hospital Stay (HOSPITAL_COMMUNITY): Payer: Medicare Other | Admitting: Anesthesiology

## 2020-01-04 ENCOUNTER — Encounter (HOSPITAL_COMMUNITY)
Admission: EM | Disposition: A | Discharge status: Rehabilitation Facility | Payer: Self-pay | Source: Home / Self Care | Attending: Family Medicine

## 2020-01-04 ENCOUNTER — Emergency Department (HOSPITAL_COMMUNITY): Payer: Medicare Other

## 2020-01-04 DIAGNOSIS — R52 Pain, unspecified: Secondary | ICD-10-CM

## 2020-01-04 DIAGNOSIS — G4700 Insomnia, unspecified: Secondary | ICD-10-CM | POA: Diagnosis not present

## 2020-01-04 DIAGNOSIS — Z20822 Contact with and (suspected) exposure to covid-19: Secondary | ICD-10-CM | POA: Diagnosis present

## 2020-01-04 DIAGNOSIS — Z9889 Other specified postprocedural states: Secondary | ICD-10-CM | POA: Diagnosis not present

## 2020-01-04 DIAGNOSIS — G479 Sleep disorder, unspecified: Secondary | ICD-10-CM | POA: Diagnosis present

## 2020-01-04 DIAGNOSIS — M810 Age-related osteoporosis without current pathological fracture: Secondary | ICD-10-CM | POA: Diagnosis not present

## 2020-01-04 DIAGNOSIS — Z8249 Family history of ischemic heart disease and other diseases of the circulatory system: Secondary | ICD-10-CM

## 2020-01-04 DIAGNOSIS — F32A Depression, unspecified: Secondary | ICD-10-CM | POA: Diagnosis present

## 2020-01-04 DIAGNOSIS — Z96649 Presence of unspecified artificial hip joint: Secondary | ICD-10-CM

## 2020-01-04 DIAGNOSIS — T50905A Adverse effect of unspecified drugs, medicaments and biological substances, initial encounter: Secondary | ICD-10-CM | POA: Diagnosis not present

## 2020-01-04 DIAGNOSIS — M545 Low back pain, unspecified: Secondary | ICD-10-CM | POA: Diagnosis present

## 2020-01-04 DIAGNOSIS — E46 Unspecified protein-calorie malnutrition: Secondary | ICD-10-CM | POA: Diagnosis not present

## 2020-01-04 DIAGNOSIS — S72144A Nondisplaced intertrochanteric fracture of right femur, initial encounter for closed fracture: Secondary | ICD-10-CM

## 2020-01-04 DIAGNOSIS — I341 Nonrheumatic mitral (valve) prolapse: Secondary | ICD-10-CM | POA: Diagnosis not present

## 2020-01-04 DIAGNOSIS — R0902 Hypoxemia: Secondary | ICD-10-CM | POA: Diagnosis not present

## 2020-01-04 DIAGNOSIS — S72001A Fracture of unspecified part of neck of right femur, initial encounter for closed fracture: Secondary | ICD-10-CM | POA: Diagnosis not present

## 2020-01-04 DIAGNOSIS — E561 Deficiency of vitamin K: Secondary | ICD-10-CM | POA: Diagnosis not present

## 2020-01-04 DIAGNOSIS — E872 Acidosis, unspecified: Secondary | ICD-10-CM

## 2020-01-04 DIAGNOSIS — S72141S Displaced intertrochanteric fracture of right femur, sequela: Secondary | ICD-10-CM | POA: Diagnosis not present

## 2020-01-04 DIAGNOSIS — Z6821 Body mass index (BMI) 21.0-21.9, adult: Secondary | ICD-10-CM | POA: Diagnosis not present

## 2020-01-04 DIAGNOSIS — E871 Hypo-osmolality and hyponatremia: Secondary | ICD-10-CM | POA: Diagnosis not present

## 2020-01-04 DIAGNOSIS — S72144D Nondisplaced intertrochanteric fracture of right femur, subsequent encounter for closed fracture with routine healing: Secondary | ICD-10-CM | POA: Diagnosis not present

## 2020-01-04 DIAGNOSIS — R0989 Other specified symptoms and signs involving the circulatory and respiratory systems: Secondary | ICD-10-CM | POA: Diagnosis not present

## 2020-01-04 DIAGNOSIS — Y92230 Patient room in hospital as the place of occurrence of the external cause: Secondary | ICD-10-CM | POA: Diagnosis not present

## 2020-01-04 DIAGNOSIS — Y92002 Bathroom of unspecified non-institutional (private) residence single-family (private) house as the place of occurrence of the external cause: Secondary | ICD-10-CM

## 2020-01-04 DIAGNOSIS — K5903 Drug induced constipation: Secondary | ICD-10-CM | POA: Diagnosis not present

## 2020-01-04 DIAGNOSIS — I10 Essential (primary) hypertension: Secondary | ICD-10-CM | POA: Diagnosis not present

## 2020-01-04 DIAGNOSIS — S72141A Displaced intertrochanteric fracture of right femur, initial encounter for closed fracture: Principal | ICD-10-CM

## 2020-01-04 DIAGNOSIS — W1811XA Fall from or off toilet without subsequent striking against object, initial encounter: Secondary | ICD-10-CM | POA: Diagnosis present

## 2020-01-04 DIAGNOSIS — M25551 Pain in right hip: Secondary | ICD-10-CM | POA: Diagnosis not present

## 2020-01-04 DIAGNOSIS — R791 Abnormal coagulation profile: Secondary | ICD-10-CM

## 2020-01-04 DIAGNOSIS — M25572 Pain in left ankle and joints of left foot: Secondary | ICD-10-CM | POA: Diagnosis not present

## 2020-01-04 DIAGNOSIS — J449 Chronic obstructive pulmonary disease, unspecified: Secondary | ICD-10-CM | POA: Diagnosis not present

## 2020-01-04 DIAGNOSIS — G8918 Other acute postprocedural pain: Secondary | ICD-10-CM | POA: Diagnosis not present

## 2020-01-04 DIAGNOSIS — Z79899 Other long term (current) drug therapy: Secondary | ICD-10-CM

## 2020-01-04 DIAGNOSIS — R739 Hyperglycemia, unspecified: Secondary | ICD-10-CM | POA: Diagnosis present

## 2020-01-04 DIAGNOSIS — S72141D Displaced intertrochanteric fracture of right femur, subsequent encounter for closed fracture with routine healing: Secondary | ICD-10-CM | POA: Diagnosis not present

## 2020-01-04 DIAGNOSIS — Z85828 Personal history of other malignant neoplasm of skin: Secondary | ICD-10-CM

## 2020-01-04 DIAGNOSIS — D649 Anemia, unspecified: Secondary | ICD-10-CM | POA: Diagnosis not present

## 2020-01-04 DIAGNOSIS — W19XXXD Unspecified fall, subsequent encounter: Secondary | ICD-10-CM | POA: Diagnosis present

## 2020-01-04 DIAGNOSIS — Z419 Encounter for procedure for purposes other than remedying health state, unspecified: Secondary | ICD-10-CM

## 2020-01-04 DIAGNOSIS — D62 Acute posthemorrhagic anemia: Secondary | ICD-10-CM

## 2020-01-04 DIAGNOSIS — Y92009 Unspecified place in unspecified non-institutional (private) residence as the place of occurrence of the external cause: Secondary | ICD-10-CM

## 2020-01-04 DIAGNOSIS — D684 Acquired coagulation factor deficiency: Secondary | ICD-10-CM | POA: Diagnosis present

## 2020-01-04 DIAGNOSIS — M62838 Other muscle spasm: Secondary | ICD-10-CM | POA: Diagnosis not present

## 2020-01-04 DIAGNOSIS — S72009A Fracture of unspecified part of neck of unspecified femur, initial encounter for closed fracture: Secondary | ICD-10-CM | POA: Diagnosis not present

## 2020-01-04 DIAGNOSIS — E8809 Other disorders of plasma-protein metabolism, not elsewhere classified: Secondary | ICD-10-CM | POA: Diagnosis not present

## 2020-01-04 DIAGNOSIS — E43 Unspecified severe protein-calorie malnutrition: Secondary | ICD-10-CM | POA: Diagnosis not present

## 2020-01-04 HISTORY — PX: INTRAMEDULLARY (IM) NAIL INTERTROCHANTERIC: SHX5875

## 2020-01-04 HISTORY — PX: OTHER SURGICAL HISTORY: SHX169

## 2020-01-04 LAB — PROTIME-INR
INR: 2.5 — ABNORMAL HIGH (ref 0.8–1.2)
INR: 1.1 (ref 0.8–1.2)
INR: 1.1 (ref 0.8–1.2)
Prothrombin Time: 26.2 seconds — ABNORMAL HIGH (ref 11.4–15.2)
Prothrombin Time: 14.1 seconds (ref 11.4–15.2)
Prothrombin Time: 13.9 seconds (ref 11.4–15.2)

## 2020-01-04 LAB — IRON AND TIBC
Iron: 80 ug/dL (ref 28–170)
Saturation Ratios: 22 % (ref 10.4–31.8)
TIBC: 362 ug/dL (ref 250–450)
UIBC: 282 ug/dL

## 2020-01-04 LAB — RETICULOCYTES
Immature Retic Fract: 3.9 % (ref 2.3–15.9)
RBC.: 3.44 MIL/uL — ABNORMAL LOW (ref 3.87–5.11)
Retic Count, Absolute: 34.4 10*3/uL (ref 19.0–186.0)
Retic Ct Pct: 1 % (ref 0.4–3.1)

## 2020-01-04 LAB — APTT
aPTT: 52 seconds — ABNORMAL HIGH (ref 24–36)
aPTT: 28 seconds (ref 24–36)

## 2020-01-04 LAB — VITAMIN B12: Vitamin B-12: 302 pg/mL (ref 180–914)

## 2020-01-04 LAB — HEPATIC FUNCTION PANEL
ALT: 15 U/L (ref 0–44)
AST: 28 U/L (ref 15–41)
Albumin: 2.9 g/dL — ABNORMAL LOW (ref 3.5–5.0)
Alkaline Phosphatase: 42 U/L (ref 38–126)
Bilirubin, Direct: 0.3 mg/dL — ABNORMAL HIGH (ref 0.0–0.2)
Indirect Bilirubin: 0.2 mg/dL — ABNORMAL LOW (ref 0.3–0.9)
Total Bilirubin: 0.5 mg/dL (ref 0.3–1.2)
Total Protein: 5.7 g/dL — ABNORMAL LOW (ref 6.5–8.1)

## 2020-01-04 LAB — CBC WITH DIFFERENTIAL/PLATELET
Abs Immature Granulocytes: 0.02 10*3/uL (ref 0.00–0.07)
Basophils Absolute: 0 10*3/uL (ref 0.0–0.1)
Basophils Relative: 0 %
Eosinophils Absolute: 0.1 10*3/uL (ref 0.0–0.5)
Eosinophils Relative: 1 %
HCT: 33.2 % — ABNORMAL LOW (ref 36.0–46.0)
Hemoglobin: 10.7 g/dL — ABNORMAL LOW (ref 12.0–15.0)
Immature Granulocytes: 0 %
Lymphocytes Relative: 18 %
Lymphs Abs: 1.4 10*3/uL (ref 0.7–4.0)
MCH: 31.1 pg (ref 26.0–34.0)
MCHC: 32.2 g/dL (ref 30.0–36.0)
MCV: 96.5 fL (ref 80.0–100.0)
Monocytes Absolute: 0.5 10*3/uL (ref 0.1–1.0)
Monocytes Relative: 7 %
Neutro Abs: 5.8 10*3/uL (ref 1.7–7.7)
Neutrophils Relative %: 74 %
Platelets: 191 10*3/uL (ref 150–400)
RBC: 3.44 MIL/uL — ABNORMAL LOW (ref 3.87–5.11)
RDW: 14.1 % (ref 11.5–15.5)
WBC: 7.8 10*3/uL (ref 4.0–10.5)
nRBC: 0 % (ref 0.0–0.2)

## 2020-01-04 LAB — BASIC METABOLIC PANEL
Anion gap: 8 (ref 5–15)
BUN: 19 mg/dL (ref 8–23)
CO2: 19 mmol/L — ABNORMAL LOW (ref 22–32)
Calcium: 7.1 mg/dL — ABNORMAL LOW (ref 8.9–10.3)
Chloride: 111 mmol/L (ref 98–111)
Creatinine, Ser: 0.73 mg/dL (ref 0.44–1.00)
GFR, Estimated: >60 mL/min (ref >60)
Glucose, Bld: 96 mg/dL (ref 70–99)
Potassium: 4 mmol/L (ref 3.5–5.1)
Sodium: 138 mmol/L (ref 135–145)

## 2020-01-04 LAB — FOLATE: Folate: 36.4 ng/mL (ref >5.9)

## 2020-01-04 LAB — CBC
HCT: 29.6 % — ABNORMAL LOW (ref 36.0–46.0)
Hemoglobin: 9.5 g/dL — ABNORMAL LOW (ref 12.0–15.0)
MCH: 30.2 pg (ref 26.0–34.0)
MCHC: 32.1 g/dL (ref 30.0–36.0)
MCV: 94 fL (ref 80.0–100.0)
Platelets: 161 10*3/uL (ref 150–400)
RBC: 3.15 MIL/uL — ABNORMAL LOW (ref 3.87–5.11)
RDW: 14.2 % (ref 11.5–15.5)
WBC: 10.5 10*3/uL (ref 4.0–10.5)
nRBC: 0 % (ref 0.0–0.2)

## 2020-01-04 LAB — FERRITIN: Ferritin: 39 ng/mL (ref 11–307)

## 2020-01-04 LAB — RESP PANEL BY RT-PCR (FLU A&B, COVID) ARPGX2
Influenza A by PCR: NEGATIVE
Influenza B by PCR: NEGATIVE
SARS Coronavirus 2 by RT PCR: NEGATIVE

## 2020-01-04 LAB — TYPE AND SCREEN
ABO/RH(D): A POS
ABO/RH(D): A POS
Antibody Screen: NEGATIVE
Antibody Screen: NEGATIVE

## 2020-01-04 LAB — ABO/RH: ABO/RH(D): A POS

## 2020-01-04 LAB — CREATININE, SERUM
Creatinine, Ser: 0.83 mg/dL (ref 0.44–1.00)
GFR, Estimated: >60 mL/min (ref >60)

## 2020-01-04 SURGERY — FIXATION, FRACTURE, INTERTROCHANTERIC, WITH INTRAMEDULLARY ROD
Anesthesia: General | Site: Hip | Laterality: Right

## 2020-01-04 MED ORDER — LIDOCAINE 2% (20 MG/ML) 5 ML SYRINGE
INTRAMUSCULAR | Status: DC | PRN
  Administered 2020-01-04: 100 mg via INTRAVENOUS

## 2020-01-04 MED ORDER — HYDROCODONE-ACETAMINOPHEN 5-325 MG PO TABS
1.0000 | ORAL_TABLET | Freq: Four times a day (QID) | ORAL | Status: DC | PRN
  Administered 2020-01-05 – 2020-01-06 (×2): 1 via ORAL
  Filled 2020-01-04 (×3): qty 1

## 2020-01-04 MED ORDER — METOPROLOL SUCCINATE ER 25 MG PO TB24
25.0000 mg | ORAL_TABLET | Freq: Every day | ORAL | Status: DC
  Administered 2020-01-04 – 2020-01-06 (×3): 25 mg via ORAL
  Filled 2020-01-04 (×3): qty 1

## 2020-01-04 MED ORDER — CEFAZOLIN SODIUM-DEXTROSE 2-4 GM/100ML-% IV SOLN
INTRAVENOUS | Status: AC
  Filled 2020-01-04: qty 100

## 2020-01-04 MED ORDER — SERTRALINE HCL 50 MG PO TABS
50.0000 mg | ORAL_TABLET | Freq: Every day | ORAL | Status: DC
  Administered 2020-01-05 – 2020-01-06 (×2): 50 mg via ORAL
  Filled 2020-01-04 (×2): qty 1

## 2020-01-04 MED ORDER — MORPHINE SULFATE (PF) 2 MG/ML IV SOLN
0.5000 mg | INTRAVENOUS | Status: DC | PRN
  Administered 2020-01-04 (×3): 0.5 mg via INTRAVENOUS
  Filled 2020-01-04 (×4): qty 1

## 2020-01-04 MED ORDER — SODIUM CHLORIDE 0.9 % IV SOLN: INTRAVENOUS | Status: DC

## 2020-01-04 MED ORDER — SODIUM CHLORIDE 0.9% IV SOLUTION: Freq: Once | INTRAVENOUS | Status: AC

## 2020-01-04 MED ORDER — PROPOFOL 10 MG/ML IV BOLUS
INTRAVENOUS | Status: DC | PRN
  Administered 2020-01-04: 100 mg via INTRAVENOUS

## 2020-01-04 MED ORDER — VITAMIN K1 10 MG/ML IJ SOLN
10.0000 mg | Freq: Once | INTRAVENOUS | Status: AC
  Administered 2020-01-04: 10 mg via INTRAVENOUS
  Filled 2020-01-04: qty 1

## 2020-01-04 MED ORDER — ONDANSETRON HCL 4 MG/2ML IJ SOLN
4.0000 mg | Freq: Once | INTRAMUSCULAR | Status: DC
  Filled 2020-01-04: qty 2

## 2020-01-04 MED ORDER — ENOXAPARIN SODIUM 40 MG/0.4ML ~~LOC~~ SOLN
40.0000 mg | SUBCUTANEOUS | Status: DC
  Administered 2020-01-05 – 2020-01-06 (×2): 40 mg via SUBCUTANEOUS
  Filled 2020-01-04 (×2): qty 0.4

## 2020-01-04 MED ORDER — CEFAZOLIN SODIUM-DEXTROSE 2-4 GM/100ML-% IV SOLN
2.0000 g | Freq: Four times a day (QID) | INTRAVENOUS | Status: AC
  Administered 2020-01-04 – 2020-01-05 (×3): 2 g via INTRAVENOUS
  Filled 2020-01-04 (×3): qty 100

## 2020-01-04 MED ORDER — DEXAMETHASONE SODIUM PHOSPHATE 10 MG/ML IJ SOLN
INTRAMUSCULAR | Status: AC
  Filled 2020-01-04: qty 2

## 2020-01-04 MED ORDER — CHLORHEXIDINE GLUCONATE 0.12 % MT SOLN: 15.0000 mL | Freq: Once | OROMUCOSAL | Status: AC

## 2020-01-04 MED ORDER — LACTATED RINGERS IV SOLN: INTRAVENOUS | Status: DC | PRN

## 2020-01-04 MED ORDER — POLYETHYLENE GLYCOL 3350 17 G PO PACK: 17.0000 g | PACK | Freq: Every day | ORAL | Status: DC | PRN

## 2020-01-04 MED ORDER — ENOXAPARIN SODIUM 40 MG/0.4ML ~~LOC~~ SOLN: 40.0000 mg | Freq: Every day | SUBCUTANEOUS | 0 refills | Status: DC

## 2020-01-04 MED ORDER — CEFAZOLIN SODIUM-DEXTROSE 2-3 GM-%(50ML) IV SOLR
INTRAVENOUS | Status: DC | PRN
  Administered 2020-01-04: 2 g via INTRAVENOUS

## 2020-01-04 MED ORDER — FENTANYL CITRATE (PF) 100 MCG/2ML IJ SOLN
INTRAMUSCULAR | Status: AC
  Filled 2020-01-04: qty 2

## 2020-01-04 MED ORDER — ROCURONIUM BROMIDE 10 MG/ML (PF) SYRINGE
PREFILLED_SYRINGE | INTRAVENOUS | Status: AC
  Filled 2020-01-04: qty 10

## 2020-01-04 MED ORDER — ONDANSETRON HCL 4 MG PO TABS: 4.0000 mg | ORAL_TABLET | Freq: Four times a day (QID) | ORAL | Status: DC | PRN

## 2020-01-04 MED ORDER — ACETAMINOPHEN 160 MG/5ML PO SOLN: 1000.0000 mg | Freq: Once | ORAL | Status: DC | PRN

## 2020-01-04 MED ORDER — LIDOCAINE HCL (PF) 2 % IJ SOLN
INTRAMUSCULAR | Status: AC
  Filled 2020-01-04: qty 5

## 2020-01-04 MED ORDER — OXYCODONE HCL 5 MG/5ML PO SOLN: 5.0000 mg | Freq: Once | ORAL | Status: DC | PRN

## 2020-01-04 MED ORDER — ACETAMINOPHEN 500 MG PO TABS: 1000.0000 mg | ORAL_TABLET | Freq: Once | ORAL | Status: DC | PRN

## 2020-01-04 MED ORDER — FENTANYL CITRATE (PF) 100 MCG/2ML IJ SOLN
INTRAMUSCULAR | Status: DC | PRN
  Administered 2020-01-04: 100 ug via INTRAVENOUS

## 2020-01-04 MED ORDER — OXYCODONE HCL 5 MG PO TABS: 5.0000 mg | ORAL_TABLET | Freq: Once | ORAL | Status: DC | PRN

## 2020-01-04 MED ORDER — PHENOL 1.4 % MT LIQD: 1.0000 | OROMUCOSAL | Status: DC | PRN

## 2020-01-04 MED ORDER — ACETAMINOPHEN 10 MG/ML IV SOLN: 1000.0000 mg | Freq: Once | INTRAVENOUS | Status: DC | PRN

## 2020-01-04 MED ORDER — DOCUSATE SODIUM 100 MG PO CAPS
100.0000 mg | ORAL_CAPSULE | Freq: Two times a day (BID) | ORAL | Status: DC
  Administered 2020-01-04 – 2020-01-06 (×3): 100 mg via ORAL
  Filled 2020-01-04 (×3): qty 1

## 2020-01-04 MED ORDER — MENTHOL 3 MG MT LOZG: 1.0000 | LOZENGE | OROMUCOSAL | Status: DC | PRN

## 2020-01-04 MED ORDER — SORBITOL 70 % SOLN
30.0000 mL | Freq: Every day | Status: DC | PRN
  Administered 2020-01-06: 30 mL via ORAL
  Filled 2020-01-04 (×2): qty 30

## 2020-01-04 MED ORDER — HYDROCODONE-ACETAMINOPHEN 7.5-325 MG PO TABS
1.0000 | ORAL_TABLET | Freq: Three times a day (TID) | ORAL | 0 refills | Status: DC | PRN
Start: 2020-01-04 — End: 2020-01-21

## 2020-01-04 MED ORDER — PROPOFOL 10 MG/ML IV BOLUS
INTRAVENOUS | Status: AC
  Filled 2020-01-04: qty 20

## 2020-01-04 MED ORDER — DEXAMETHASONE SODIUM PHOSPHATE 10 MG/ML IJ SOLN
INTRAMUSCULAR | Status: DC | PRN
  Administered 2020-01-04: 4 mg via INTRAVENOUS

## 2020-01-04 MED ORDER — ALUM & MAG HYDROXIDE-SIMETH 200-200-20 MG/5ML PO SUSP: 30.0000 mL | ORAL | Status: DC | PRN

## 2020-01-04 MED ORDER — FENTANYL CITRATE (PF) 250 MCG/5ML IJ SOLN
INTRAMUSCULAR | Status: AC
  Filled 2020-01-04: qty 5

## 2020-01-04 MED ORDER — FENTANYL CITRATE (PF) 100 MCG/2ML IJ SOLN
25.0000 ug | INTRAMUSCULAR | Status: DC | PRN
  Administered 2020-01-04 (×2): 25 ug via INTRAVENOUS

## 2020-01-04 MED ORDER — METHOCARBAMOL 1000 MG/10ML IJ SOLN
500.0000 mg | Freq: Four times a day (QID) | INTRAVENOUS | Status: DC | PRN
  Filled 2020-01-04: qty 5

## 2020-01-04 MED ORDER — SUGAMMADEX SODIUM 200 MG/2ML IV SOLN
INTRAVENOUS | Status: DC | PRN
  Administered 2020-01-04: 200 mg via INTRAVENOUS

## 2020-01-04 MED ORDER — CHLORHEXIDINE GLUCONATE 0.12 % MT SOLN
OROMUCOSAL | Status: AC
  Administered 2020-01-04: 15 mL via OROMUCOSAL
  Filled 2020-01-04: qty 15

## 2020-01-04 MED ORDER — 0.9 % SODIUM CHLORIDE (POUR BTL) OPTIME
TOPICAL | Status: DC | PRN
  Administered 2020-01-04: 1000 mL

## 2020-01-04 MED ORDER — ONDANSETRON HCL 4 MG/2ML IJ SOLN
INTRAMUSCULAR | Status: DC | PRN
  Administered 2020-01-04: 4 mg via INTRAVENOUS

## 2020-01-04 MED ORDER — ROCURONIUM BROMIDE 10 MG/ML (PF) SYRINGE
PREFILLED_SYRINGE | INTRAVENOUS | Status: DC | PRN
  Administered 2020-01-04: 60 mg via INTRAVENOUS

## 2020-01-04 MED ORDER — METHOCARBAMOL 500 MG PO TABS
500.0000 mg | ORAL_TABLET | Freq: Four times a day (QID) | ORAL | Status: DC | PRN
  Administered 2020-01-04 – 2020-01-05 (×2): 500 mg via ORAL
  Filled 2020-01-04 (×2): qty 1

## 2020-01-04 MED ORDER — MAGNESIUM CITRATE PO SOLN: 1.0000 | Freq: Once | ORAL | Status: DC | PRN

## 2020-01-04 MED ORDER — ONDANSETRON HCL 4 MG/2ML IJ SOLN
INTRAMUSCULAR | Status: AC
  Filled 2020-01-04: qty 2

## 2020-01-04 MED ORDER — ONDANSETRON HCL 4 MG/2ML IJ SOLN: 4.0000 mg | Freq: Four times a day (QID) | INTRAMUSCULAR | Status: DC | PRN

## 2020-01-04 MED ORDER — FENTANYL CITRATE (PF) 100 MCG/2ML IJ SOLN
50.0000 ug | INTRAMUSCULAR | Status: AC | PRN
  Administered 2020-01-04 (×2): 50 ug via INTRAVENOUS
  Filled 2020-01-04 (×2): qty 2

## 2020-01-04 SURGICAL SUPPLY — 42 items
BIT DRILL INTERTAN LAG SCREW (BIT) ×2 IMPLANT
BNDG COHESIVE 4X5 TAN STRL (GAUZE/BANDAGES/DRESSINGS) ×1 IMPLANT
BNDG COHESIVE 6X5 TAN STRL LF (GAUZE/BANDAGES/DRESSINGS) ×4 IMPLANT
BNDG GAUZE ELAST 4 BULKY (GAUZE/BANDAGES/DRESSINGS) ×1 IMPLANT
COVER PERINEAL POST (MISCELLANEOUS) ×3 IMPLANT
COVER SURGICAL LIGHT HANDLE (MISCELLANEOUS) ×3 IMPLANT
DRAPE C-ARMOR (DRAPES) ×3 IMPLANT
DRAPE STERI IOBAN 125X83 (DRAPES) ×3 IMPLANT
DRSG MEPILEX BORDER 4X4 (GAUZE/BANDAGES/DRESSINGS) ×5 IMPLANT
DRSG PAD ABDOMINAL 8X10 ST (GAUZE/BANDAGES/DRESSINGS) ×2 IMPLANT
DURAPREP 26ML APPLICATOR (WOUND CARE) ×5 IMPLANT
ELECT REM PT RETURN 9FT ADLT (ELECTROSURGICAL) ×3
ELECTRODE REM PT RTRN 9FT ADLT (ELECTROSURGICAL) ×1 IMPLANT
GLOVE BIOGEL PI IND STRL 7.0 (GLOVE) ×1 IMPLANT
GLOVE BIOGEL PI INDICATOR 7.0 (GLOVE) ×2
GLOVE ECLIPSE 7.0 STRL STRAW (GLOVE) ×5 IMPLANT
GLOVE SKINSENSE NS SZ7.5 (GLOVE) ×6
GLOVE SKINSENSE STRL SZ7.5 (GLOVE) ×2 IMPLANT
GOWN STRL REIN XL XLG (GOWN DISPOSABLE) ×3 IMPLANT
GUIDE PIN 3.2X343 (PIN) ×2
GUIDE PIN 3.2X343MM (PIN) ×6
GUIDE ROD 3.0 (MISCELLANEOUS) ×3
KIT BASIN OR (CUSTOM PROCEDURE TRAY) ×3 IMPLANT
KIT TURNOVER KIT B (KITS) ×3 IMPLANT
MANIFOLD NEPTUNE II (INSTRUMENTS) ×3 IMPLANT
NAIL TRIGEN 10MMX36CM-125 RT (Nail) ×2 IMPLANT
NS IRRIG 1000ML POUR BTL (IV SOLUTION) ×3 IMPLANT
PACK GENERAL/GYN (CUSTOM PROCEDURE TRAY) ×3 IMPLANT
PAD ARMBOARD 7.5X6 YLW CONV (MISCELLANEOUS) ×6 IMPLANT
PAD CAST 4YDX4 CTTN HI CHSV (CAST SUPPLIES) ×2 IMPLANT
PADDING CAST COTTON 4X4 STRL (CAST SUPPLIES)
PIN GUIDE 3.2X343MM (PIN) IMPLANT
ROD GUIDE 3.0 (MISCELLANEOUS) IMPLANT
SCREW LAG COMPR KIT 85/80 (Screw) ×2 IMPLANT
STAPLER VISISTAT 35W (STAPLE) ×3 IMPLANT
SUT VIC AB 0 CT1 27 (SUTURE) ×3
SUT VIC AB 0 CT1 27XBRD ANBCTR (SUTURE) ×1 IMPLANT
SUT VIC AB 2-0 CT1 27 (SUTURE) ×6
SUT VIC AB 2-0 CT1 TAPERPNT 27 (SUTURE) ×1 IMPLANT
TOWEL GREEN STERILE (TOWEL DISPOSABLE) ×3 IMPLANT
TOWEL GREEN STERILE FF (TOWEL DISPOSABLE) ×3 IMPLANT
WATER STERILE IRR 1000ML POUR (IV SOLUTION) ×3 IMPLANT

## 2020-01-04 NOTE — Discharge Instructions (Signed)
° ° °  1. Change dressings as needed °2. May shower but keep incisions covered and dry °3. Take lovenox to prevent blood clots °4. Take stool softeners as needed °5. Take pain meds as needed ° °

## 2020-01-04 NOTE — ED Triage Notes (Signed)
Pt arrived via [facility] from home. Pt dozed off while on the toilet and fell. Pt sustained an injury to the right hip.   EMS gave 200 of fentanyl 20 gauge left forearm  Vitals from EMS:  BP: 150/84 HR: 70s

## 2020-01-04 NOTE — ED Notes (Signed)
Pt to [facility] OR at this time, [facility] at bedside, report given to [facility] with no further questions at this time.

## 2020-01-04 NOTE — H&P (Signed)
History and Physical    Alison Rivera ZWC:[id number] DOB: 12-20-1931 DOA: 01/04/2020  PCP: Marton Redwood, MD Patient coming from: Home  Chief Complaint: Fall, right hip pain  HPI: Alison Rivera is a 86 y.o. female with medical history significant of mitral valve prolapse, osteoporosis, depression, hypertension presented to the ED with complaints of right hip pain after a fall which occurred shortly prior to EMS arrival.  Patient states she takes Ambien for sleep and when to use the bathroom around 1:30 this morning.  She fell asleep on the commode which made her fall onto her right side.  Denies hitting her head or losing consciousness.  States her right hip is hurting since the fall but she does not have pain anywhere else.  Patient has no other complaints.  Denies dizziness, chest pain, shortness of breath, cough, nausea, vomiting, abdominal pain, diarrhea, or dysuria.  ED Course: Vital signs stable.  WBC 7.8, hemoglobin 10.7, hematocrit 33.2, platelet 191K.  Sodium 138, potassium 4.0, chloride 111, bicarb 19, anion gap 8, BUN 19, creatinine 0.7, glucose 96.  PT 26.2, INR 2.5, APTT 52.  LFTs normal.  Screening SARS-CoV-2 PCR test and influenza panel both negative.  Chest x-ray showing findings consistent with COPD and no active disease.  X-ray of right hip/pelvis showing intratrochanteric fracture of the right femur.  ED provider discussed the case with Dr. Erlinda Hong from orthopedics who requested keeping the patient n.p.o. and admission to [facility].  He is planning on taking the patient to the OR today.  Patient received fentanyl and IV fluid.  Review of Systems:  All systems reviewed and apart from history of presenting illness, are negative.  Past Medical History:  Diagnosis Date  . Chest pain, atypical   . Lichen planus   . Mitral valve prolapse   . Osteoporosis   . Squamous cell skin cancer     Past Surgical History:  Procedure Laterality Date  . NOSE SURGERY        reports that she has never smoked. She does not have any smokeless tobacco history on file. She reports current alcohol use of about 5.0 standard drinks of alcohol per week. She reports that she does not use drugs.  No Known Allergies  Family History  Problem Relation Age of Onset  . Other Mother        AD  . Congestive Heart Failure Father   . Syncope episode Father   . Parkinson's disease Brother   . Other Brother        Deep Brain Stimulator    Prior to Admission medications   Medication Sig Start Date End Date Taking? Authorizing Provider  Cholecalciferol (HM VITAMIN D3) 4000 UNITS CAPS Take 1 capsule by mouth daily.   Yes [provider]  hydrOXYzine (ATARAX/VISTARIL) 25 MG tablet Take 25 mg by mouth every 8 (eight) hours as needed for itching.   Yes [provider]  metoprolol succinate (TOPROL-XL) 25 MG 24 hr tablet Take 25 mg by mouth daily.   Yes [provider]  Multiple Vitamins-Minerals (CENTRUM SILVER) tablet Take 1 tablet by mouth daily.   Yes [provider]  sertraline (ZOLOFT) 50 MG tablet Take 50 mg by mouth daily. 10/20/19  Yes [provider]  zolpidem (AMBIEN) 10 MG tablet Take 5 mg by mouth at bedtime as needed for sleep.   Yes [provider]    Physical Exam: Vitals:   01/04/20 0250 01/04/20 0444  BP: (!) 149/73 Alison Rivera)  144/66  Pulse: 79 76  Resp: 18 16  Temp: 97.8 F (36.6 C)   TempSrc: Oral   SpO2: 94% 97%  Weight: 54.4 kg   Height: 5\' 4"  (1.626 m)     Physical Exam Constitutional:      General: She is not in acute distress. HENT:     Head: Normocephalic and atraumatic.  Eyes:     Extraocular Movements: Extraocular movements intact.     Conjunctiva/sclera: Conjunctivae normal.  Cardiovascular:     Rate and Rhythm: Normal rate and regular rhythm.     Pulses: Normal pulses.  Pulmonary:     Effort: Pulmonary effort is normal. No respiratory distress.     Breath sounds: Normal breath sounds. No  wheezing or rales.  Abdominal:     General: Bowel sounds are normal. There is no distension.     Palpations: Abdomen is soft.     Tenderness: There is no abdominal tenderness.  Musculoskeletal:        General: No swelling.     Cervical back: Normal range of motion and neck supple.     Comments: Right lower extremity shortened and externally rotated Dorsalis pedis pulse intact bilaterally  Skin:    General: Skin is warm and dry.  Neurological:     Mental Status: She is alert and oriented to person, place, and time.     Labs on Admission: I have personally reviewed following labs and imaging studies  CBC: Recent Labs  Lab 01/04/20 0248  WBC 7.8  NEUTROABS 5.8  HGB 10.7*  HCT 33.2*  MCV 96.5  PLT 413   Basic Metabolic Panel: Recent Labs  Lab 01/04/20 0248  NA 138  K 4.0  CL 111  CO2 19*  GLUCOSE 96  BUN 19  CREATININE 0.73  CALCIUM 7.1*   GFR: Estimated Creatinine Clearance: 43.4 mL/min (by C-G formula based on SCr of 0.73 mg/dL). Liver Function Tests: Recent Labs  Lab 01/04/20 0248  AST 28  ALT 15  ALKPHOS 42  BILITOT 0.5  PROT 5.7*  ALBUMIN 2.9*   No results for input(s): LIPASE, AMYLASE in the last 168 hours. No results for input(s): AMMONIA in the last 168 hours. Coagulation Profile: Recent Labs  Lab 01/04/20 0248  INR 2.5*   Cardiac Enzymes: No results for input(s): CKTOTAL, CKMB, CKMBINDEX, TROPONINI in the last 168 hours. BNP (last 3 results) No results for input(s): PROBNP in the last 8760 hours. HbA1C: No results for input(s): HGBA1C in the last 72 hours. CBG: No results for input(s): GLUCAP in the last 168 hours. Lipid Profile: No results for input(s): CHOL, HDL, LDLCALC, TRIG, CHOLHDL, LDLDIRECT in the last 72 hours. Thyroid Function Tests: No results for input(s): TSH, T4TOTAL, FREET4, T3FREE, THYROIDAB in the last 72 hours. Anemia Panel: No results for input(s): VITAMINB12, FOLATE, FERRITIN, TIBC, IRON, RETICCTPCT in the last 72  hours. Urine analysis: No results found for: COLORURINE, APPEARANCEUR, LABSPEC, [place], GLUCOSEU, HGBUR, BILIRUBINUR, KETONESUR, PROTEINUR, UROBILINOGEN, NITRITE, LEUKOCYTESUR  Radiological Exams on Admission: DG Chest 1 View  Result Date: 01/04/2020 CLINICAL DATA:  Fall EXAM: CHEST  1 VIEW COMPARISON:  02/26/2005 FINDINGS: The lungs are symmetrically hyperinflated in keeping with changes of COPD. No superimposed confluent pulmonary infiltrate. No pneumothorax or pleural effusion. Cardiac size is at the upper limits of normal, stable since prior examination. Pulmonary vascularity is normal. No acute bone abnormality. IMPRESSION: No active disease.  COPD. Electronically Signed   By: Fidela Salisbury MD   On: 01/04/2020 04:02  DG Hip Unilat With Pelvis 2-3 Views Right  Result Date: 01/04/2020 CLINICAL DATA:  Fall from toilet with right hip pain, initial encounter EXAM: DG HIP (WITH OR WITHOUT PELVIS) 3V RIGHT COMPARISON:  None. FINDINGS: There is an intratrochanteric fracture of the proximal right femur with impaction and angulation at the fracture site. Femoral head is well seated. Pelvic ring is intact. No soft tissue abnormality is noted. IMPRESSION: Intratrochanteric fracture of the right femur. Electronically Signed   By: Inez Catalina M.D.   On: 01/04/2020 04:02    EKG: Independently reviewed.  Sinus rhythm with first-degree AV block.  No significant change since prior tracing.  Assessment/Plan Principal Problem:   Hip fracture (HCC) Active Problems:   Essential hypertension, benign   Anemia   Metabolic acidosis   Coagulation test abnormality   Right hip fracture secondary to a fall X-ray of right hip/pelvis showing intratrochanteric fracture of the right femur. -Keep n.p.o., gentle IV fluid hydration.  Morphine as needed for pain, Norco as needed.  Ortho has been consulted and is planning on taking the patient to the OR today at [facility].  Abnormal coags PT 26.2, INR  2.5, APTT 52.  LFTs normal. -Check vitamin K level  Normocytic anemia Hemoglobin 10.7, hematocrit 33.2, MCV 96.5.  Patient is not endorsing any symptoms of GI bleed. -Anemia panel, FOBT  Normal anion gap metabolic acidosis Bicarb 19, anion gap 8.  Patient is not on a diuretic.  Not endorsing diarrhea. -IV fluid hydration and repeat metabolic panel  Hypertension Stable. -Continue home metoprolol  Depression -Continue home Zoloft  DVT prophylaxis: SCDs Code Status: Patient wishes to be full code. Family Communication: Husband at bedside. Disposition Plan: Status is: Inpatient  Remains inpatient appropriate because:Ongoing active pain requiring inpatient pain management, IV treatments appropriate due to intensity of illness or inability to take PO, Inpatient level of care appropriate due to severity of illness and Needs surgery for hip fracture   Dispo: The patient is from: Home              Anticipated d/c is to: SNF              Anticipated d/c date is: 3 days              Patient currently is not medically stable to d/c.  The medical decision making on this patient was of high complexity and the patient is at high risk for clinical deterioration, therefore this is a level 3 visit.  Shela Leff MD [facility]  If 7PM-7AM, please contact night-coverage [email or web address]  01/04/2020, 6:04 AM

## 2020-01-04 NOTE — Op Note (Signed)
   Date of Surgery: 01/04/2020  INDICATIONS: Ms. Furey is a 86 y.o.-year-old female who sustained a right hip fracture. The risks and benefits of the procedure discussed with the patient prior to the procedure and all questions were answered; consent was obtained.  PREOPERATIVE DIAGNOSIS: right intertroch hip fracture   POSTOPERATIVE DIAGNOSIS: Same   PROCEDURE: Open treatment of intertrochanteric fracture with intramedullary implant. CPT [id number]   SURGEON: N. Eduard Roux, M.D.   ASSIST: Ciro Backer [place], [place]; necessary for the timely completion of procedure and due to complexity of procedure.  ANESTHESIA: general   IV FLUIDS AND URINE: See anesthesia record   ESTIMATED BLOOD LOSS: 150 cc  IMPLANTS: [employer] InterTAN 10 x 36, 85/80 lag screws  DRAINS: None.   COMPLICATIONS: see description of procedure.   DESCRIPTION OF PROCEDURE: The patient was brought to the operating room and placed supine on the operating table. The patient's leg had been signed prior to the procedure. The patient had the anesthesia placed by the anesthesiologist. The prep verification and incision time-outs were performed to confirm that this was the correct patient, site, side and location. The patient had an SCD on the opposite lower extremity. The patient did receive antibiotics prior to the incision and was re-dosed during the procedure as needed at indicated intervals. The patient was positioned on the fracture table with the table in traction and internal rotation to reduce the hip. The well leg was placed in a scissor position and all bony prominences were well-padded. The patient had the lower extremity prepped and draped in the standard surgical fashion. The incision was made 4 finger breadths superior to the greater trochanter. A guide pin was inserted into the tip of the greater trochanter under fluoroscopic guidance. An opening reamer was used to gain access to the femoral canal.  Femoral  canal was prepared with a 11.5 mm reamer.  The nail length was measured and inserted down the femoral canal to its proper depth. The appropriate version of insertion for the lag screw was found under fluoroscopy. A pin was inserted up the femoral neck through the jig. Then, a second antirotation pin was inserted inferior to the first pin. The length of the lag screw was then measured. The lag screw was inserted as near to center-center in the head as possible. The antirotation pin was then taken out and an interdigitating compression screw was placed in its place. The leg was taken out of traction, then the interdigitating compression screw was used to compress across the fracture. Compression was visualized on serial xrays. The wounds were copiously irrigated with saline and the subcutaneous layer closed with 2.0 vicryl and the skin was reapproximated with staples. The wounds were cleaned and dried a final time and a sterile dressing was placed. The hip was taken through a range of motion at the end of the case under fluoroscopic imaging to visualize the approach-withdraw phenomenon and confirm implant length in the head. The patient was then awakened from anesthesia and taken to the recovery room in stable condition. All counts were correct at the end of the case.   POSTOPERATIVE PLAN: The patient will be weight bearing as tolerated and will return in 2 weeks for staple removal and the patient will receive DVT prophylaxis based on other medications, activity level, and risk ratio of bleeding to thrombosis.   Azucena Cecil, MD [facility] 4:08 PM

## 2020-01-04 NOTE — Progress Notes (Signed)
° °  Patient seen and examined at bedside, patient admitted after midnight, please see earlier detailed admission note by Shela Leff, MD. Briefly, patient presented secondary to a fall and subsequent right hip fracture. Orthopedic surgery consulted for management and will take patient to the OR today.  Subjective: Continued leg pain.  BP (!) 125/59    Pulse 72    Temp 98.4 F (36.9 C) (Oral)    Resp 18    Ht 5\' 4"  (1.626 m)    Wt 54.4 kg    SpO2 97%    BMI 20.60 kg/m   General exam: Appears calm and comfortable Respiratory system: Clear to auscultation. Respiratory effort normal. Cardiovascular system: S1 & S2 heard, RRR. No murmurs, rubs, gallops or clicks. Gastrointestinal system: Abdomen is nondistended, soft and nontender. No organomegaly or masses felt. Normal bowel sounds heard. Central nervous system: Alert and oriented. No focal neurological deficits. Musculoskeletal: No edema. No calf tenderness. Right leg is externally rotated and elevated Skin: No cyanosis. No rashes Psychiatry: Judgement and insight appear normal. Mood & affect appropriate.   Brief assessment/Plan:  Right hip fracture Plan for surgical repair today per orthopedic surgery -Continue morphine and Norco prn -Continue IV fluids since NPO  Coagulopathy Unknown etiology. Vitamin K ordered. Patient ordered 1 unit of FFP secondary to need for surgery -Recheck INR; if elevated, will need to re-confirm -Follow-up vitamin K  Family communication: DVT prophylaxis: Disposition: Discharge home vs SNF pending orthopedic surgery management in addition to PT/OT recs. Anticipate discharge in 2-4 days  Cordelia Poche, MD [facility] 01/04/2020, 11:27 AM

## 2020-01-04 NOTE — Anesthesia Preprocedure Evaluation (Addendum)
Anesthesia Evaluation  Patient identified by MRN, date of birth, ID band Patient awake    Reviewed: Allergy & Precautions, NPO status , Patient's Chart, lab work & pertinent test results  History of Anesthesia Complications Negative for: history of anesthetic complications  Airway Mallampati: II  TM Distance: >3 FB Neck ROM: Full    Dental  (+) Dental Advisory Given, Teeth Intact, Chipped,    Pulmonary neg pulmonary ROS, neg shortness of breath, neg sleep apnea, neg COPD, neg recent URI,  Covid-19 Nucleic Acid Test Results Lab Results      Component                Value               Date                      SARSCOV2NAA              NEGATIVE            01/04/2020              breath sounds clear to auscultation       Cardiovascular hypertension,  Rhythm:Regular  - Left ventricle: The cavity size was normal. Wall thickness  was normal. Systolic function was normal. The estimated  ejection fraction was in the range of 55% to 60%.  - Aortic valve: Mild regurgitation.  - Mitral valve: Mild anterior leaflet prolapse Mild  regurgitation.  - Left atrium: The atrium was mildly dilated.  - Atrial septum: No defect or patent foramen ovale was  identified.  - Pulmonary arteries: PA peak pressure: 14mm Hg (S).    Neg stress 2015   Neuro/Psych  Headaches, neg Seizures PSYCHIATRIC DISORDERS Anxiety    GI/Hepatic negative GI ROS, Neg liver ROS,   Endo/Other  negative endocrine ROS  Renal/GU negative Renal ROSLab Results      Component                Value               Date                      CREATININE               0.73                01/04/2020           Lab Results      Component                Value               Date                      K                        4.0                 01/04/2020                Musculoskeletal RIGHT HIP FRACTURE   Abdominal   Peds  Hematology  (+) Blood dyscrasia, anemia ,  Lab Results      Component                Value  Date                      WBC                      7.8                 01/04/2020                HGB                      10.7 (L)            01/04/2020                HCT                      33.2 (L)            01/04/2020                MCV                      96.5                01/04/2020                PLT                      191                 01/04/2020             Lab Results      Component                Value               Date                      INR                      1.1                 01/04/2020                INR                      2.5 (H)             01/04/2020              Anesthesia Other Findings   Reproductive/Obstetrics                            Anesthesia Physical Anesthesia Plan  ASA: II  Anesthesia Plan: General   Post-op Pain Management:    Induction: Intravenous  PONV Risk Score and Plan: 3 and Ondansetron and Dexamethasone  Airway Management Planned: Oral ETT  Additional Equipment: None  Intra-op Plan:   Post-operative Plan: Extubation in OR  Informed Consent: I have reviewed the patients History and Physical, chart, labs and discussed the procedure including the risks, benefits and alternatives for the proposed anesthesia with the patient or authorized representative who has indicated his/her understanding and acceptance.     Dental advisory given  Plan Discussed with: CRNA and Surgeon  Anesthesia Plan Comments:  Anesthesia Quick Evaluation  

## 2020-01-04 NOTE — Plan of Care (Signed)
Patient arrived to department from PACU at 17:55. No family at bedside. Alert and Oriented x4. VS Stable. Neuro checks WNL. Complaints of mild pain. Medicated accordingly. No concerns voiced at present. Patient educated on plan of care. Will continue to monitor.

## 2020-01-04 NOTE — ED Notes (Signed)
Pt spo2 down to 88%,  placed on 2L [place]. Changed out of clothing, and into hospital gown. 2nd IV established. Medicated for pain. Pending admission bed.

## 2020-01-04 NOTE — Transfer of Care (Signed)
Immediate Anesthesia Transfer of Care Note  Patient: Alison Rivera  Procedure(s) Performed: INTRAMEDULLARY (IM) NAIL INTERTROCHANTRIC (Right Hip)  Patient Location: PACU  Anesthesia Type:General  Level of Consciousness: drowsy and patient cooperative  Airway & Oxygen Therapy: Patient Spontanous Breathing and Patient connected to nasal cannula oxygen  Post-op Assessment: Report given to RN, Post -op Vital signs reviewed and stable and Patient moving all extremities  Post vital signs: Reviewed and stable  Last Vitals:  Vitals Value Taken Time  BP 120/63 01/04/20 1639  Temp    Pulse 80 01/04/20 1639  Resp 11 01/04/20 1639  SpO2 96 % 01/04/20 1639  Vitals shown include unvalidated device data.  Last Pain:  Vitals:   01/04/20 1407  TempSrc:   PainSc: 8       Patients Stated Pain Goal: 2 (78/58/85 0277)  Complications: No complications documented.

## 2020-01-04 NOTE — Anesthesia Postprocedure Evaluation (Signed)
Anesthesia Post Note  Patient: ASA FATH  Procedure(s) Performed: INTRAMEDULLARY (IM) NAIL INTERTROCHANTRIC (Right Hip)     Patient location during evaluation: PACU Anesthesia Type: General Level of consciousness: awake and alert Pain management: pain level controlled Vital Signs Assessment: post-procedure vital signs reviewed and stable Respiratory status: spontaneous breathing, nonlabored ventilation, respiratory function stable and patient connected to nasal cannula oxygen Cardiovascular status: blood pressure returned to baseline and stable Postop Assessment: no apparent nausea or vomiting Anesthetic complications: no   No complications documented.  Last Vitals:  Vitals:   01/04/20 1858 01/04/20 2012  BP: 129/61 (!) 120/52  Pulse: 68 63  Resp:  16  Temp: 36.9 C 37.4 C  SpO2: 100% 100%    Last Pain:  Vitals:   01/04/20 2012  TempSrc: Oral  PainSc:                  Chariah Bailey

## 2020-01-04 NOTE — Anesthesia Procedure Notes (Signed)
Procedure Name: Intubation Date/Time: 01/04/2020 3:26 PM Performed by: Moshe Salisbury, CRNA Pre-anesthesia Checklist: Patient identified, Emergency Drugs available, Suction available and Patient being monitored Patient Re-evaluated:Patient Re-evaluated prior to induction Oxygen Delivery Method: Circle System Utilized Preoxygenation: Pre-oxygenation with 100% oxygen Induction Type: IV induction Ventilation: Mask ventilation without difficulty Laryngoscope Size: Mac and 3 Grade View: Grade II Tube type: Oral Tube size: 7.0 mm Number of attempts: 1 Airway Equipment and Method: Stylet Placement Confirmation: ETT inserted through vocal cords under direct vision,  positive ETCO2 and breath sounds checked- equal and bilateral Secured at: 20 cm Tube secured with: Tape Dental Injury: Teeth and Oropharynx as per pre-operative assessment

## 2020-01-04 NOTE — ED Notes (Signed)
Patient to xray.

## 2020-01-04 NOTE — ED Provider Notes (Signed)
[facility] DEPT Provider Note   CSN: [id number] Arrival date & time: 01/04/20  0242     History Chief Complaint  Patient presents with  . Fall    Alison Rivera is a 86 y.o. female with a history of mitral valve prolapse, migraines, hypertension, and osteoporosis who presents to the emergency department via EMS status post fall shortly prior to arrival with complaints of right hip pain.  Per EMS patient reported to have dozed off while on the commode and fell to her right side onto her hip.  Patient confirms this, denies head injury or loss of consciousness.  States she is having pain only to the right hip, worse with movement, alleviated significantly with fentanyl given in route (200 mcg).  She also had an Ambien prior to bed.  She denies numbness, tingling, weakness, headache, neck pain, or back pain.  Last p.o. intake was 6 PM last night, she had waffles and milk.  HPI     Past Medical History:  Diagnosis Date  . Chest pain, atypical   . Lichen planus   . Mitral valve prolapse   . Osteoporosis   . Squamous cell skin cancer     Patient Active Problem List   Diagnosis Date Noted  . Anxiety 03/30/2013  . Obsessive-compulsive disorders 03/30/2013  . Essential hypertension, benign 03/30/2013  . Osteoporosis, unspecified 03/30/2013  . Chest pain, atypical   . Mitral valve prolapse   . MIGRAINE HEADACHE 10/29/2006  . HYPERTENSION 10/29/2006  . ATRIAL ARRHYTHMIAS 10/29/2006  . OSTEOPENIA 10/29/2006  . INSOMNIA 10/29/2006  . URINARY FREQUENCY 10/29/2006    Past Surgical History:  Procedure Laterality Date  . NOSE SURGERY       OB History   No obstetric history on file.     Family History  Problem Relation Age of Onset  . Other Mother        AD  . Congestive Heart Failure Father   . Syncope episode Father   . Parkinson's disease Brother   . Other Brother        Deep Brain Stimulator    Social History   Tobacco Use  .  Smoking status: Never Smoker  Substance Use Topics  . Alcohol use: Yes    Alcohol/week: 5.0 standard drinks    Types: 5 Glasses of wine per week  . Drug use: No    Home Medications Prior to Admission medications   Medication Sig Start Date End Date Taking? Authorizing Provider  calcium carbonate (OS-CAL) 600 MG TABS tablet Take 600 mg by mouth 2 (two) times daily with a meal.    [provider]  Cholecalciferol (HM VITAMIN D3) 4000 UNITS CAPS Take 1 capsule by mouth daily.    [provider]  hydrOXYzine (ATARAX/VISTARIL) 25 MG tablet Take 25 mg by mouth every 8 (eight) hours as needed for itching.    [provider]  metoprolol succinate (TOPROL-XL) 25 MG 24 hr tablet Take 25 mg by mouth daily.    [provider]  Multiple Vitamins-Minerals (CENTRUM SILVER) tablet Take 1 tablet by mouth daily.    [provider]  zolpidem (AMBIEN) 10 MG tablet Take 5 mg by mouth at bedtime as needed for sleep.    [provider]    Allergies    Patient has no known allergies.  Review of Systems   Review of Systems  Constitutional: Negative for chills and fever.  Eyes: Negative for visual disturbance.  Respiratory: Negative  for shortness of breath.   Cardiovascular: Negative for chest pain.  Gastrointestinal: Negative for abdominal pain and vomiting.  Musculoskeletal: Positive for arthralgias. Negative for back pain and neck pain.  Skin: Negative for wound.  Neurological: Negative for weakness, numbness and headaches.  All other systems reviewed and are negative.   Physical Exam Updated Vital Signs BP (!) 149/73 (BP Location: Left Arm)   Pulse 79   Temp 97.8 F (36.6 C) (Oral)   Resp 18   Ht 5\' 4"  (1.626 m)   Wt 54.4 kg   SpO2 94%   BMI 20.60 kg/m   Physical Exam Vitals and nursing note reviewed.  Constitutional:      General: She is not in acute distress.    Appearance: She is not toxic-appearing.  HENT:     Head:  Normocephalic and atraumatic.     Comments: No raccoon eyes or battle sign.  No palpable facial bony tenderness.    Nose: Nose normal.  Eyes:     Pupils: Pupils are equal, round, and reactive to light.  Cardiovascular:     Rate and Rhythm: Normal rate and regular rhythm.     Pulses: Normal pulses.     Comments: 2+ symmetric radial, DP and PT pulses bilaterally. Pulmonary:     Effort: Pulmonary effort is normal.     Breath sounds: Normal breath sounds.  Chest:     Chest wall: No tenderness.  Abdominal:     General: There is no distension.     Palpations: Abdomen is soft.     Tenderness: There is no abdominal tenderness. There is no guarding or rebound.  Musculoskeletal:     Cervical back: Normal range of motion and neck supple. No tenderness.     Comments: Upper extremities: No obvious deformities or significant open wounds.  Intact active range of motion throughout.  No focal bony tenderness. Back: No midline tenderness or palpable step-off. Lower extremities: Right lower extremity with obvious deformity, shortening.  No significant open wounds.  Patient has intact active range of motion throughout the major joints of the left lower extremity.  She is able to move the ankle and the toes of the right lower extremity.  Right hip and knee active range of motion limited secondary to pain, also has pain with attempts at passive ROM.  Patient is tender to palpation to the anterior and lateral right hip.  Lower extremities are otherwise nontender.  Neurological:     Mental Status: She is alert.     Comments: Alert.  Clear speech.  Sensation grossly intact bilateral upper and lower extremities.  5/5 symmetric grip strength.  Able to move toes bilaterally.     ED Results / Procedures / Treatments   Labs (all labs ordered are listed, but only abnormal results are displayed) Labs Reviewed  BASIC METABOLIC PANEL - Abnormal; Notable for the following components:      Result Value   CO2 19 (*)     Calcium 7.1 (*)    All other components within normal limits  CBC WITH DIFFERENTIAL/PLATELET - Abnormal; Notable for the following components:   RBC 3.44 (*)    Hemoglobin 10.7 (*)    HCT 33.2 (*)    All other components within normal limits  PROTIME-INR - Abnormal; Notable for the following components:   Prothrombin Time 26.2 (*)    INR 2.5 (*)    All other components within normal limits  HEPATIC FUNCTION PANEL - Abnormal; Notable for the following  components:   Total Protein 5.7 (*)    Albumin 2.9 (*)    Bilirubin, Direct 0.3 (*)    Indirect Bilirubin 0.2 (*)    All other components within normal limits  APTT - Abnormal; Notable for the following components:   aPTT 52 (*)    All other components within normal limits  RESP PANEL BY RT-PCR (FLU A&B, COVID) [id number]  TYPE AND SCREEN    EKG EKG Interpretation  Date/Time:  Monday January 04 2020 02:54:03 EST Ventricular Rate:  74 PR Interval:    QRS Duration: 92 QT Interval:  405 QTC Calculation: 450 R Axis:   -22 Text Interpretation: Sinus rhythm Prolonged PR interval Borderline left axis deviation Anteroseptal infarct, age indeterminate No previous ECGs available Confirmed by Shanon Rosser [phone]) on 01/04/2020 3:00:12 AM   Radiology No results found.  Procedures Procedures (including critical care time)  Medications Ordered in ED Medications  0.9 %  sodium chloride infusion (has no administration in time range)  fentaNYL (SUBLIMAZE) injection 50 mcg (has no administration in time range)  ondansetron (ZOFRAN) injection 4 mg (has no administration in time range)    ED Course  I have reviewed the triage vital signs and the nursing notes.  Pertinent labs & imaging results that were available during my care of the patient were reviewed by me and considered in my medical decision making (see chart for details).    MDM Rules/Calculators/A&P                          Patient presents to the ED for evaluation S/p  fall after falling asleep on commode with complaints of right hip pain. Patient is nontoxic, vitals WNL with the exception of elevated BP- doubt HTN emergency. RLE shortened, pain with PROM of right hip, tender to right anterolateral hip- concerning for hip fx- NVI distally. No signs of serious head/neck/back injury or chest/abdominal injury. No reported head injury, midline spinal tenderness, or focal neuro deficit. No chest/abdominal tenderness.   Additional history obtained:  Additional history obtained from EMS & nursing note review  EKG: Sinus rhythm Prolonged PR interval Borderline left axis deviation Anteroseptal infarct, age indeterminate No previous ECGs available  Lab Tests:  I Ordered, reviewed, and interpreted labs, which included:  CBC: mild anemia- no prior on record for comparison.  BMP: hypocalcemia & mildly low bicarb PT/INR: mildly elevated- unclear etiology, not on warfarin or other anticoagulation, hepatic function panel ordered.  COVID; Pending  Imaging Studies ordered:  I ordered imaging studies which included CXR & R hip x-ray, I independently visualized and interpreted imaging which showed right inter-trochanteric fracture of the femur.   04:15: CONSULT: Discussed with orthopedist Dr. Erlinda Hong- likely plan for OR today, keep NPO, requesting patient be admitted to [facility].   05:07: CONSULT: Discussed with hospitalist Dr. Marlowe Sax- accepts admission.   This is a shared visit with supervising physician Dr. Florina Ou who has independently evaluated patient & provided guidance in evaluation/management/disposition, in agreement with care   Portions of this note were generated with Dragon dictation software. Dictation errors may occur despite best attempts at proofreading.  Final Clinical Impression(s) / ED Diagnoses Final diagnoses:  Fall from toilet seat, initial encounter  Closed displaced intertrochanteric fracture of right femur, initial encounter [facility])    Rx / DC  Orders ED Discharge Orders    None       Amaryllis Dyke, PA-C 01/04/20 0518    Molpus, Jenny Reichmann,  MD 01/04/20 9444

## 2020-01-05 ENCOUNTER — Encounter (HOSPITAL_COMMUNITY): Payer: Self-pay | Admitting: Internal Medicine

## 2020-01-05 DIAGNOSIS — S72141A Displaced intertrochanteric fracture of right femur, initial encounter for closed fracture: Secondary | ICD-10-CM | POA: Diagnosis not present

## 2020-01-05 LAB — PREPARE FRESH FROZEN PLASMA: Unit division: 0

## 2020-01-05 LAB — CBC
HCT: 26.4 % — ABNORMAL LOW (ref 36.0–46.0)
Hemoglobin: 8.7 g/dL — ABNORMAL LOW (ref 12.0–15.0)
MCH: 31 pg (ref 26.0–34.0)
MCHC: 33 g/dL (ref 30.0–36.0)
MCV: 94 fL (ref 80.0–100.0)
Platelets: 137 10*3/uL — ABNORMAL LOW (ref 150–400)
RBC: 2.81 MIL/uL — ABNORMAL LOW (ref 3.87–5.11)
RDW: 14.2 % (ref 11.5–15.5)
WBC: 6.9 10*3/uL (ref 4.0–10.5)
nRBC: 0 % (ref 0.0–0.2)

## 2020-01-05 LAB — BASIC METABOLIC PANEL
Anion gap: 10 (ref 5–15)
BUN: 19 mg/dL (ref 8–23)
CO2: 25 mmol/L (ref 22–32)
Calcium: 8.7 mg/dL — ABNORMAL LOW (ref 8.9–10.3)
Chloride: 97 mmol/L — ABNORMAL LOW (ref 98–111)
Creatinine, Ser: 0.8 mg/dL (ref 0.44–1.00)
GFR, Estimated: >60 mL/min (ref >60)
Glucose, Bld: 139 mg/dL — ABNORMAL HIGH (ref 70–99)
Potassium: 4.1 mmol/L (ref 3.5–5.1)
Sodium: 132 mmol/L — ABNORMAL LOW (ref 135–145)

## 2020-01-05 LAB — PROTIME-INR
INR: 1.2 (ref 0.8–1.2)
Prothrombin Time: 14.8 seconds (ref 11.4–15.2)

## 2020-01-05 LAB — BPAM FFP
Blood Product Expiration Date: 202112042359
ISSUE DATE / TIME: 202111290944
Unit Type and Rh: 6200

## 2020-01-05 MED ORDER — VITAMIN B-12 100 MCG PO TABS
100.0000 ug | ORAL_TABLET | Freq: Every day | ORAL | Status: DC
  Administered 2020-01-05 – 2020-01-06 (×2): 100 ug via ORAL
  Filled 2020-01-05 (×2): qty 1

## 2020-01-05 MED ORDER — POLYETHYLENE GLYCOL 3350 17 G PO PACK
17.0000 g | PACK | Freq: Every day | ORAL | Status: DC
  Administered 2020-01-05 – 2020-01-06 (×2): 17 g via ORAL
  Filled 2020-01-05 (×2): qty 1

## 2020-01-05 MED ORDER — ACETAMINOPHEN 325 MG PO TABS
650.0000 mg | ORAL_TABLET | Freq: Four times a day (QID) | ORAL | Status: DC | PRN
  Administered 2020-01-05 (×2): 650 mg via ORAL
  Filled 2020-01-05: qty 2

## 2020-01-05 NOTE — Progress Notes (Signed)
Inpatient Rehabilitation Admissions Coordinator  Inpatient rehab consult received. I met with patient , daughter and spouse at bedside. We discussed goals and expectations of a possible [facility] admit. They prefer [facility] rather than SNF. I will discuss case with our Rehab MD and let acute team know if pt could be admitted pending Rehab MD approval. I will follow up tomorrow. Family aware that TOC will also be pursuing SNF options.  Barbara Boyette, RN, MSN Rehab Admissions Coordinator [phone] 01/05/2020 11:09 AM   . 

## 2020-01-05 NOTE — Plan of Care (Signed)
  Problem: Education: Goal: Verbalization of understanding the information provided (i.e., activity precautions, restrictions, etc) will improve Outcome: Progressing   Problem: Activity: Goal: Ability to ambulate and perform ADLs will improve Outcome: Progressing   Problem: Pain Management: Goal: Pain level will decrease Outcome: Progressing   Problem: Education: Goal: Knowledge of General Education information will improve Description: Including pain rating scale, medication(s)/side effects and non-pharmacologic comfort measures Outcome: Progressing   Problem: Elimination: Goal: Will not experience complications related to bowel motility Outcome: Progressing   Problem: Pain Managment: Goal: General experience of comfort will improve Outcome: Progressing   Problem: Safety: Goal: Ability to remain free from injury will improve Outcome: Progressing

## 2020-01-05 NOTE — Plan of Care (Signed)
  Problem: Education: Goal: Knowledge of General Education information will improve Description: Including pain rating scale, medication(s)/side effects and non-pharmacologic comfort measures Outcome: Progressing   Problem: Health Behavior/Discharge Planning: Goal: Ability to manage health-related needs will improve Outcome: Progressing   Problem: Clinical Measurements: Goal: Will remain free from infection Outcome: Progressing   Problem: Activity: Goal: Risk for activity intolerance will decrease Outcome: Progressing   Problem: Nutrition: Goal: Adequate nutrition will be maintained Outcome: Progressing   Problem: Coping: Goal: Level of anxiety will decrease Outcome: Progressing   Problem: Elimination: Goal: Will not experience complications related to bowel motility Outcome: Progressing   Problem: Pain Managment: Goal: General experience of comfort will improve Outcome: Progressing   Problem: Safety: Goal: Ability to remain free from injury will improve Outcome: Progressing

## 2020-01-05 NOTE — Evaluation (Signed)
Physical Therapy Evaluation Patient Details Name: Alison Rivera MRN: [id number] DOB: 1932/01/18 Today's Date: 01/05/2020   History of Present Illness  Pt is an 86 y.o. female admitted 01/04/20 after falling asleep on commode with fall onto R-side sustaining R hip pain. Imiaging showed R femur intertrochanteric fx. S/p R hip IM implant 11/29. PMH includes osteoporosis.    Clinical Impression  Pt presents with an overall decrease in functional mobility secondary to above. PTA, pt independent, active and lives with supportive husband; has other family nearby available to assist if needed. Educ on RLE WBAT precautions, positioning, therex, and importance of mobility. Today, pt able to initiate transfer training with RW and modA; difficulty taking steps, limited by post-op pain and weakness, as well as impaired balance strategies; at high risk for falls. Pt motivated to participate and regain PLOF. Pt would benefit from intensive CIR-level therapies to maximize functional mobility and independence prior to return home.    Follow Up Recommendations CIR;Supervision for mobility/OOB    Equipment Recommendations  Rolling walker with 5" wheels;3in1 (PT);Wheelchair (measurements PT);Wheelchair cushion (measurements PT)    Recommendations for Other Services       Precautions / Restrictions Precautions Precautions: Fall Restrictions Weight Bearing Restrictions: Yes RLE Weight Bearing: Weight bearing as tolerated      Mobility  Bed Mobility Overal bed mobility: Needs Assistance Bed Mobility: Supine to Sit     Supine to sit: Mod assist;HOB elevated     General bed mobility comments: ModA for RLE management, scooting hips to EOB and assisting trunk elevation, cued to use bed rail to assist    Transfers Overall transfer level: Needs assistance Equipment used: Rolling walker (2 wheeled) Transfers: Sit to/from Stand Sit to Stand: Mod assist;Max assist         General transfer comment:  Unable to stand on initial attempt, requiring return to sit and totalA to flex R knee in preparation to stand, cues for foot/hand placement and sequencing; able to stand on second trial with modA for trunk elevation and stability; maxA for eccentric control into sitting, limited by pain and weakness  Ambulation/Gait Ambulation/Gait assistance: Mod assist Gait Distance (Feet): 2 Feet Assistive device: Rolling walker (2 wheeled) Gait Pattern/deviations: Step-to pattern;Trunk flexed;Leaning posteriorly;Antalgic;Decreased dorsiflexion - right;Decreased weight shift to right Gait velocity: Decreased   General Gait Details: Significant increased time and effort initiating gait due to RLE post-op pain and weakness, requiring RW and modA to take a few steps to recliner; max cues for sequencing, unable to take complete step with RLE, reliant on sliding foot; difficulty accepting weight onto RLE in order to step with LLE  Stairs            Wheelchair Mobility    Modified Rankin (Stroke Patients Only)       Balance Overall balance assessment: Needs assistance   Sitting balance-Leahy Scale: Fair       Standing balance-Leahy Scale: Poor Standing balance comment: Reliant on UE support and external assist                             Pertinent Vitals/Pain Pain Assessment: Faces Faces Pain Scale: Hurts even more Pain Location: RLE Pain Descriptors / Indicators: Discomfort;Grimacing;Guarding;Moaning Pain Intervention(s): Limited activity within patient's tolerance;Ice applied;Repositioned;Patient requesting pain meds-RN notified;RN gave pain meds during session    [place] expects to be discharged to:: Private residence Living Arrangements: Spouse/significant other Available Help at Discharge: Family;Available 24 hours/day Type  of Home: House Home Access: Stairs to enter Entrance Stairs-Rails: None Entrance Stairs-Number of Steps: 2 Home Layout:  Multi-level;Able to live on main level with bedroom/bathroom Home Equipment: Shower seat - built in;Grab bars - tub/shower;Hand held shower head Additional Comments: Husband in fair physical help and able to assist. Daughter lives nearby and does not work, could help as well    Prior Function Level of Independence: Independent               Journalist, newspaper        Extremity/Trunk Assessment   Upper Extremity Assessment Upper Extremity Assessment: Generalized weakness    Lower Extremity Assessment Lower Extremity Assessment: RLE deficits/detail RLE Deficits / Details: s/p R hip nailing; able to wiggle toes, partial knee ext/flex, minimal ankle PF/DF and minimal hip flex - limited by post-op pain and weakness       Communication   Communication: No difficulties  Cognition Arousal/Alertness: Awake/alert Behavior During Therapy: WFL for tasks assessed/performed Overall Cognitive Status: Within Functional Limits for tasks assessed                                 General Comments: WFL for simple tasks, although internally distracted by pain requiring increased cues during mobility; not formally assessed      General Comments General comments (skin integrity, edema, etc.): Very motivated and appreciative of being out of bed despite pain; denies dizziness/lightheadedness wit mobility; unable to get reliable pulse ox reading, pt asymptomatic with no SOB noted    Exercises Other Exercises Other Exercises: Quad sets, partial RLE LAQ seated in recliner   Assessment/Plan    PT Assessment Patient needs continued PT services  PT Problem List Decreased strength;Decreased range of motion;Decreased activity tolerance;Decreased balance;Decreased mobility;Decreased knowledge of use of DME;Decreased knowledge of precautions;Pain       PT Treatment Interventions DME instruction;Gait training;Stair training;Functional mobility training;Therapeutic activities;Therapeutic  exercise;Balance training;Patient/family education    PT Goals (Current goals can be found in the Care Plan section)  Acute Rehab PT Goals Patient Stated Goal: Willing to consider post-acute rehab services if needed PT Goal Formulation: With patient Time For Goal Achievement: 01/19/20 Potential to Achieve Goals: Good    Frequency Min 5X/week   Barriers to discharge        Co-evaluation               AM-PAC PT "6 Clicks" Mobility  Outcome Measure Help needed turning from your back to your side while in a flat bed without using bedrails?: A Lot Help needed moving from lying on your back to sitting on the side of a flat bed without using bedrails?: A Lot Help needed moving to and from a bed to a chair (including a wheelchair)?: A Lot Help needed standing up from a chair using your arms (e.g., wheelchair or bedside chair)?: A Lot Help needed to walk in hospital room?: A Lot Help needed climbing 3-5 steps with a railing? : A Lot 6 Click Score: 12    End of Session Equipment Utilized During Treatment: Gait belt Activity Tolerance: Patient tolerated treatment well;Patient limited by pain Patient left: in chair;with call bell/phone within reach;with chair alarm set Nurse Communication: Mobility status PT Visit Diagnosis: Other abnormalities of gait and mobility (R26.89);Pain Pain - Right/Left: Right Pain - part of body: Leg;Hip    Time: 3154-0086 PT Time Calculation (min) (ACUTE ONLY): 28 min   Charges:   PT  Evaluation $PT Eval Moderate Complexity: 1 Mod PT Treatments $Therapeutic Activity: 8-22 mins      Mabeline Caras, PT, DPT Acute Rehabilitation Services  Pager [phone] Office [place] 01/05/2020, 9:17 AM

## 2020-01-05 NOTE — Evaluation (Signed)
Occupational Therapy Evaluation Patient Details Name: Alison Rivera MRN: [id number] DOB: Feb 10, 1931 Today's Date: 01/05/2020    History of Present Illness Pt is an 86 y.o. female admitted 01/04/20 after falling asleep on commode with fall onto R-side sustaining R hip pain. Imiaging showed R femur intertrochanteric fx. S/p R hip IM implant 11/29. PMH includes osteoporosis.   Clinical Impression   Pt is motivated to work with therapy to regain lost function.  Pt was I with ADLs and mobility prior to fx. Pt has a supportive husband who is able to assist at dc. Pt. Has a daughter that lives near her that does not work. They can provide 24/7 care. Pt. Is requiring extensive assist at this time and she would be difficult for family to care for. Pt. And family are in agreement that she needs either cir or snf for rehab. Pt to be followed by acute OT.   Follow Up Recommendations  CIR;SNF    Equipment Recommendations  None recommended by OT    Recommendations for Other Services Rehab consult (PT already put in consult)     Precautions / Restrictions Precautions Precautions: Fall Restrictions Weight Bearing Restrictions: Yes RLE Weight Bearing: Weight bearing as tolerated      Mobility Bed Mobility Overal bed mobility: Needs Assistance Bed Mobility: Supine to Sit     Supine to sit: Mod assist;HOB elevated     General bed mobility comments: ModA for RLE management, scooting hips to EOB and assisting trunk elevation, cued to use bed rail to assist    Transfers Overall transfer level: Needs assistance Equipment used: Rolling walker (2 wheeled) Transfers: Sit to/from Stand Sit to Stand: Max assist         General transfer comment: Unable to stand on initial attempt, requiring return to sit and totalA to flex R knee in preparation to stand, cues for foot/hand placement and sequencing; able to stand on second trial with modA for trunk elevation and stability; maxA for eccentric  control into sitting, limited by pain and weakness    Balance Overall balance assessment: Needs assistance   Sitting balance-Leahy Scale: Fair       Standing balance-Leahy Scale: Poor Standing balance comment: Reliant on UE support and external assist                           ADL either performed or assessed with clinical judgement   ADL Overall ADL's : Needs assistance/impaired Eating/Feeding: Independent   Grooming: Wash/dry hands;Wash/dry face;Set up;Sitting   Upper Body Bathing: Supervision/ safety;Set up;Sitting   Lower Body Bathing: Maximal assistance;Sit to/from stand   Upper Body Dressing : Minimal assistance;Sitting   Lower Body Dressing: Total assistance;Sit to/from stand   Toilet Transfer: Maximal assistance;Stand-pivot   Toileting- Clothing Manipulation and Hygiene: Total assistance;Sit to/from stand       Functional mobility during ADLs: Maximal assistance;Rolling walker General ADL Comments: Pt. needs further ed on use of AE. Pt. is limted with LE ADLs secondary to pain.      Vision Baseline Vision/History: Wears glasses Wears Glasses: Reading only Patient Visual Report: No change from baseline Vision Assessment?: No apparent visual deficits     Perception     Praxis      Pertinent Vitals/Pain Pain Assessment: 0-10 Pain Score: 6  Faces Pain Scale: Hurts even more Pain Location: back Pain Descriptors / Indicators: Aching Pain Intervention(s): Limited activity within patient's tolerance;Premedicated before session     Hand Dominance  Right   Extremity/Trunk Assessment Upper Extremity Assessment Upper Extremity Assessment: Generalized weakness   Lower Extremity Assessment Lower Extremity Assessment: Defer to PT evaluation RLE Deficits / Details: s/p R hip nailing; able to wiggle toes, partial knee ext/flex, minimal ankle PF/DF and minimal hip flex - limited by post-op pain and weakness       Communication  Communication Communication: No difficulties   Cognition Arousal/Alertness: Awake/alert Behavior During Therapy: WFL for tasks assessed/performed Overall Cognitive Status: Within Functional Limits for tasks assessed                                 General Comments: WFL for simple tasks, although internally distracted by pain requiring increased cues during mobility; not formally assessed   General Comments  Pt.  is motivated to work with OT    Exercises Other Exercises Other Exercises: Lobbyist, partial RLE LAQ seated in recliner   Shoulder Instructions      Home Living Family/patient expects to be discharged to:: Private residence Living Arrangements: Spouse/significant other Available Help at Discharge: Family;Available 24 hours/day Type of Home: House Home Access: Stairs to enter [employer] of Steps: 2 Entrance Stairs-Rails: None Home Layout: Multi-level;Able to live on main level with bedroom/bathroom     Bathroom Shower/Tub: Occupational psychologist: Standard     Home Equipment: Shower seat - built in;Grab bars - tub/shower;Hand held shower head   Additional Comments: Husband in fair physical help and able to assist. Daughter lives nearby and does not work, could help as well      Prior Functioning/Environment Level of Independence: Independent                 OT Problem List: Decreased strength;Decreased activity tolerance;Decreased knowledge of use of DME or AE;Pain      OT Treatment/Interventions: Self-care/ADL training;DME and/or AE instruction;Therapeutic activities;Patient/family education    OT Goals(Current goals can be found in the care plan section) Acute Rehab OT Goals Patient Stated Goal: Pt. and family are in agreement for CIR or SNF OT Goal Formulation: With patient Time For Goal Achievement: 01/19/20 Potential to Achieve Goals: Good ADL Goals Pt Will Perform Lower Body Bathing: with modified  independence;sit to/from stand Pt Will Perform Lower Body Dressing: with modified independence;with adaptive equipment;sit to/from stand Pt Will Transfer to Toilet: with modified independence;ambulating;bedside commode;regular height toilet Pt Will Perform Toileting - Clothing Manipulation and hygiene: with modified independence;sit to/from stand Pt Will Perform Tub/Shower Transfer: with modified independence;shower seat;ambulating  OT Frequency: Min 2X/week   Barriers to D/C: Decreased caregiver support (at pt. current level. she does have a supportive family )          Co-evaluation              AM-PAC OT "6 Clicks" Daily Activity     Outcome Measure Help from another person eating meals?: None Help from another person taking care of personal grooming?: A Little Help from another person toileting, which includes using toliet, bedpan, or urinal?: Total Help from another person bathing (including washing, rinsing, drying)?: A Lot Help from another person to put on and taking off regular upper body clothing?: A Little Help from another person to put on and taking off regular lower body clothing?: Total 6 Click Score: 14   End of Session Equipment Utilized During Treatment: Rolling walker Nurse Communication:  (ok therapy)  Activity Tolerance: Patient limited by pain Patient  left: in chair;with call bell/phone within reach;with family/visitor present  OT Visit Diagnosis: Unsteadiness on feet (R26.81);Pain                Time: 6681-5947 OT Time Calculation (min): 25 min Charges:  OT General Charges $OT Visit: 1 Visit OT Evaluation $OT Eval Moderate Complexity: 1 Mod  Reece Packer OT/L  Aarna Mihalko 01/05/2020, 10:44 AM

## 2020-01-05 NOTE — Progress Notes (Signed)
PROGRESS NOTE    Alison Rivera  IEP:[id number] DOB: 1931/08/18 DOA: 01/04/2020 PCP: Marton Redwood, MD   Brief Narrative: 86 year old with past medical history significant for mitral valve prolapse, osteoporosis, depression, hypertension who presented to the ED complaining of right hip pain after a  Fall.  Patient fell asleep on the commode which made her fall onto her right side.  Of note patient takes Ambien for sleep at night.  Patient was found to have elevated INR at 2.5 of unclear etiology.  She received Vitamin K and  1 unit of FFP prior to surgery.  She underwent open treatment of intertrochanteric fracture with intramedullary implant.  CPT on 11/29 by Dr. Erlinda Hong.    Assessment & Plan:   Principal Problem:   Closed fracture of femur, intertrochanteric, right, initial encounter [facility]) Active Problems:   Essential hypertension, benign   Anemia   Metabolic acidosis   Coagulation test abnormality  1-Right intertrochanteric hip fracture: -Patient received FFP prior to surgery for elevated INR. -Underwent open treatment of intertrochanteric fracture with intramedullary implant on 11/29 by Dr. Erlinda Hong. -PT per Ortho. -DVT prophylaxis per Ortho. -Patient will likely require skilled nursing facility -Bowel regimen order  2-Abnormal coags: Patient received vitamin K and FFP INR normalized.  3-normal anion gap metabolic acidosis: Treated with IV fluids Resolved  4-Hypertension: Continue with metoprolol  5-Depression: Continue with Zoloft 6-Mild hyponatremia: Continue with IV fluids 7-anemia acute blood loss anemia: Post Surgery, expected.  Continue to monitor hemoglobin  Estimated body mass index is 20.6 kg/m as calculated from the following:   Height as of this encounter: 5\' 4"  (1.626 m).   Weight as of this encounter: 54.4 kg.   DVT prophylaxis: Lovenox Code Status: Full code Family Communication: care discussed with patient  Disposition Plan:  Status is:  Inpatient  Remains inpatient appropriate because:Ongoing active pain requiring inpatient pain management   Dispo: The patient is from: Home              Anticipated d/c is to: SNF              Anticipated d/c date is: 2 days              Patient currently is not medically stable to d/c.        Consultants:   Dr Erlinda Hong   Procedures:  -Underwent open treatment of intertrochanteric fracture with intramedullary implant on 11/29 by Dr. Erlinda Hong.  Antimicrobials:    Subjective: Pain controlled. Report mild headaches.    Objective: Vitals:   01/04/20 2012 01/05/20 0100 01/05/20 0358 01/05/20 0809  BP: (!) 120/52 (!) 102/47 (!) 105/44 (!) 101/38  Pulse: 63 64 63 63  Resp: 16 15 16 18   Temp: 99.3 F (37.4 C) 98.6 F (37 C) 98.5 F (36.9 C) 99.2 F (37.3 C)  TempSrc: Oral Oral Oral Oral  SpO2: 100% 93% 94% 94%  Weight:      Height:        Intake/Output Summary (Last 24 hours) at 01/05/2020 0824 Last data filed at 01/05/2020 0400 Gross per 24 hour  Intake 2181.25 ml  Output 925 ml  Net 1256.25 ml   Filed Weights   01/04/20 0250  Weight: 54.4 kg    Examination:  General exam: Appears calm and comfortable  Respiratory system: Clear to auscultation. Respiratory effort normal. Cardiovascular system: S1 & S2 heard, RRR. No JVD, murmurs, rubs, gallops or clicks. No pedal edema. Gastrointestinal system: Abdomen is nondistended, soft and nontender. No organomegaly  or masses felt. Normal bowel sounds heard. Central nervous system: Alert and oriented. No focal neurological deficits. Extremities:  Right hip with clean dressing, ice bag  Data Reviewed: I have personally reviewed following labs and imaging studies  CBC: Recent Labs  Lab 01/04/20 0248 01/04/20 1818 01/05/20 0452  WBC 7.8 10.5 6.9  NEUTROABS 5.8  --   --   HGB 10.7* 9.5* 8.7*  HCT 33.2* 29.6* 26.4*  MCV 96.5 94.0 94.0  PLT 191 161 195*   Basic Metabolic Panel: Recent Labs  Lab 01/04/20 0248  01/04/20 1818 01/05/20 0452  NA 138  --  132*  K 4.0  --  4.1  CL 111  --  97*  CO2 19*  --  25  GLUCOSE 96  --  139*  BUN 19  --  19  CREATININE 0.73 0.83 0.80  CALCIUM 7.1*  --  8.7*   GFR: Estimated Creatinine Clearance: 43.4 mL/min (by C-G formula based on SCr of 0.8 mg/dL). Liver Function Tests: Recent Labs  Lab 01/04/20 0248  AST 28  ALT 15  ALKPHOS 42  BILITOT 0.5  PROT 5.7*  ALBUMIN 2.9*   No results for input(s): LIPASE, AMYLASE in the last 168 hours. No results for input(s): AMMONIA in the last 168 hours. Coagulation Profile: Recent Labs  Lab 01/04/20 0248 01/04/20 1118 01/04/20 1337 01/05/20 0452  INR 2.5* 1.1 1.1 1.2   Cardiac Enzymes: No results for input(s): CKTOTAL, CKMB, CKMBINDEX, TROPONINI in the last 168 hours. BNP (last 3 results) No results for input(s): PROBNP in the last 8760 hours. HbA1C: No results for input(s): HGBA1C in the last 72 hours. CBG: No results for input(s): GLUCAP in the last 168 hours. Lipid Profile: No results for input(s): CHOL, HDL, LDLCALC, TRIG, CHOLHDL, LDLDIRECT in the last 72 hours. Thyroid Function Tests: No results for input(s): TSH, T4TOTAL, FREET4, T3FREE, THYROIDAB in the last 72 hours. Anemia Panel: Recent Labs    01/04/20 0705  VITAMINB12 302  FOLATE 36.4  FERRITIN 39  TIBC 362  IRON 80  RETICCTPCT 1.0   Sepsis Labs: No results for input(s): PROCALCITON, LATICACIDVEN in the last 168 hours.  Recent Results (from the past 240 hour(s))  Resp Panel by RT-PCR (Flu A&B, Covid) Nasopharyngeal Swab     Status: None   Collection Time: 01/04/20  4:00 AM   Specimen: Nasopharyngeal Swab; Nasopharyngeal(NP) swabs in vial transport medium  Result Value Ref Range Status   SARS Coronavirus 2 by RT PCR NEGATIVE NEGATIVE Final    Comment: (NOTE) SARS-CoV-2 target nucleic acids are NOT DETECTED.  The SARS-CoV-2 RNA is generally detectable in upper respiratory specimens during the acute phase of infection. The  lowest concentration of SARS-CoV-2 viral copies this assay can detect is 138 copies/mL. A negative result does not preclude SARS-Cov-2 infection and should not be used as the sole basis for treatment or other patient management decisions. A negative result may occur with  improper specimen collection/handling, submission of specimen other than nasopharyngeal swab, presence of viral mutation(s) within the areas targeted by this assay, and inadequate number of viral copies(<138 copies/mL). A negative result must be combined with clinical observations, patient history, and epidemiological information. The expected result is Negative.  Fact Sheet for Patients:  EntrepreneurPulse.com.au  Fact Sheet for Healthcare Providers:  IncredibleEmployment.be  This test is no t yet approved or cleared by the [place] FDA and  has been authorized for detection and/or diagnosis of SARS-CoV-2 by FDA under an Emergency Use Authorization (  EUA). This EUA will remain  in effect (meaning this test can be used) for the duration of the COVID-19 declaration under Section 564(b)(1) of the Act, 21 U.S.C.section 360bbb-3(b)(1), unless the authorization is terminated  or revoked sooner.       Influenza A by PCR NEGATIVE NEGATIVE Final   Influenza B by PCR NEGATIVE NEGATIVE Final    Comment: (NOTE) The Xpert Xpress SARS-CoV-2/FLU/RSV plus assay is intended as an aid in the diagnosis of influenza from Nasopharyngeal swab specimens and should not be used as a sole basis for treatment. Nasal washings and aspirates are unacceptable for Xpert Xpress SARS-CoV-2/FLU/RSV testing.  Fact Sheet for Patients: EntrepreneurPulse.com.au  Fact Sheet for Healthcare Providers: IncredibleEmployment.be  This test is not yet approved or cleared by the [place] FDA and has been authorized for detection and/or diagnosis of SARS-CoV-2 by FDA under  an Emergency Use Authorization (EUA). This EUA will remain in effect (meaning this test can be used) for the duration of the COVID-19 declaration under Section 564(b)(1) of the Act, 21 U.S.C. section 360bbb-3(b)(1), unless the authorization is terminated or revoked.  Performed at [facility], [place] [address] [place], [place] [address]          Radiology Studies: DG Chest 1 View  Result Date: 01/04/2020 CLINICAL DATA:  Fall EXAM: CHEST  1 VIEW COMPARISON:  02/26/2005 FINDINGS: The lungs are symmetrically hyperinflated in keeping with changes of COPD. No superimposed confluent pulmonary infiltrate. No pneumothorax or pleural effusion. Cardiac size is at the upper limits of normal, stable since prior examination. Pulmonary vascularity is normal. No acute bone abnormality. IMPRESSION: No active disease.  COPD. Electronically Signed   By: Fidela Salisbury MD   On: 01/04/2020 04:02   Pelvis Portable  Result Date: 01/04/2020 CLINICAL DATA:  Intramedullary nail placement for fracture EXAM: PORTABLE PELVIS 1-2 VIEWS COMPARISON:  Intraoperative radiographs right hip January 04, 2020; preoperative pelvis and right hip images January 04, 2020 FINDINGS: Frontal view obtained. There is screw and nail fixation through an intertrochanteric femur fracture on the right with alignment essentially anatomic at the fracture site. Screw tips in proximal right femur. Nail remains within intramedullary portion of bone throughout its course. No new fracture. No dislocation. Narrowing right hip joint is stable. Bony overgrowth along the superolateral aspect of the right acetabulum present. IMPRESSION: Screw and nail fixation through fracture of the intertrochanteric proximal right femur with alignment essentially anatomic in the area of fracture on frontal view. Screw tips in proximal femur. Narrowing right hip joint. Bony overgrowth along the superolateral aspect of the right acetabulum potentially  places patient at increased risk for femoroacetabular impingement. Electronically Signed   By: Lowella Grip III M.D.   On: 01/04/2020 17:21   DG C-Arm 1-60 Min  Result Date: 01/04/2020 CLINICAL DATA:  Fixation right hip fracture. EXAM: OPERATIVE right HIP (WITH PELVIS IF PERFORMED) 2 VIEWS TECHNIQUE: Fluoroscopic spot image(s) were submitted for interpretation post-operatively. COMPARISON:  Earlier same day. FINDINGS: Examination demonstrates placement of right femoral intramedullary nail with 2 associated screws bridging the femoral neck into the femoral head fixating patient's intertrochanteric fracture. Hardware is intact with anatomic alignment over the fracture site. IMPRESSION: Expected changes post fixation of right intertrochanteric hip fracture. Electronically Signed   By: Marin Olp M.D.   On: 01/04/2020 16:34   DG HIP OPERATIVE UNILAT W OR W/O PELVIS RIGHT  Result Date: 01/04/2020 CLINICAL DATA:  Fixation right hip fracture. EXAM: OPERATIVE right HIP (WITH PELVIS IF PERFORMED)  2 VIEWS TECHNIQUE: Fluoroscopic spot image(s) were submitted for interpretation post-operatively. COMPARISON:  Earlier same day. FINDINGS: Examination demonstrates placement of right femoral intramedullary nail with 2 associated screws bridging the femoral neck into the femoral head fixating patient's intertrochanteric fracture. Hardware is intact with anatomic alignment over the fracture site. IMPRESSION: Expected changes post fixation of right intertrochanteric hip fracture. Electronically Signed   By: Marin Olp M.D.   On: 01/04/2020 16:34   DG Hip Unilat With Pelvis 2-3 Views Right  Result Date: 01/04/2020 CLINICAL DATA:  Fall from toilet with right hip pain, initial encounter EXAM: DG HIP (WITH OR WITHOUT PELVIS) 3V RIGHT COMPARISON:  None. FINDINGS: There is an intratrochanteric fracture of the proximal right femur with impaction and angulation at the fracture site. Femoral head is well seated. Pelvic  ring is intact. No soft tissue abnormality is noted. IMPRESSION: Intratrochanteric fracture of the right femur. Electronically Signed   By: Inez Catalina M.D.   On: 01/04/2020 04:02        Scheduled Meds: . docusate sodium  100 mg Oral BID  . enoxaparin (LOVENOX) injection  40 mg Subcutaneous Q24H  . metoprolol succinate  25 mg Oral Daily  . sertraline  50 mg Oral Daily   Continuous Infusions: . sodium chloride 75 mL/hr at 01/04/20 1515  . sodium chloride 75 mL/hr at 01/04/20 1829  .  ceFAZolin (ANCEF) IV 2 g (01/05/20 0356)  . methocarbamol (ROBAXIN) IV       LOS: 1 day    Time spent: 35 minutes    Charan Prieto A Roverto Bodmer, MD [facility]   If 7PM-7AM, please contact night-coverage [email or web address]  01/05/2020, 8:24 AM

## 2020-01-05 NOTE — Progress Notes (Signed)
Subjective: 1 Day Post-Op Procedure(s) (LRB): INTRAMEDULLARY (IM) NAIL INTERTROCHANTRIC (Right) Patient reports pain as mild.    Objective: Vital signs in last 24 hours: Temp:  [98.1 F (36.7 C)-99.3 F (37.4 C)] 98.5 F (36.9 C) (11/30 0358) Pulse Rate:  [61-77] 63 (11/30 0358) Resp:  [14-20] 16 (11/30 0358) BP: (102-144)/(44-93) 105/44 (11/30 0358) SpO2:  [92 %-100 %] 94 % (11/30 0358)  Intake/Output from previous day: 11/29 0701 - 11/30 0700 In: 2181.3 [I.V.:2031.3; IV Piggyback:150] Out: 925 [Urine:725; Blood:200] Intake/Output this shift: No intake/output data recorded.  Recent Labs    01/04/20 0248 01/04/20 1818 01/05/20 0452  HGB 10.7* 9.5* 8.7*   Recent Labs    01/04/20 1818 01/05/20 0452  WBC 10.5 6.9  RBC 3.15* 2.81*  HCT 29.6* 26.4*  PLT 161 137*   Recent Labs    01/04/20 0248 01/04/20 0248 01/04/20 1818 01/05/20 0452  NA 138  --   --  132*  K 4.0  --   --  4.1  CL 111  --   --  97*  CO2 19*  --   --  25  BUN 19  --   --  19  CREATININE 0.73   < > 0.83 0.80  GLUCOSE 96  --   --  139*  CALCIUM 7.1*  --   --  8.7*   < > = values in this interval not displayed.   Recent Labs    01/04/20 1337 01/05/20 0452  INR 1.1 1.2    Neurologically intact Neurovascular intact Sensation intact distally Intact pulses distally Dorsiflexion/Plantar flexion intact Incision: dressing C/D/I No cellulitis present Compartment soft   Assessment/Plan: 1 Day Post-Op Procedure(s) (LRB): INTRAMEDULLARY (IM) NAIL INTERTROCHANTRIC (Right) Up with therapy WBAT RLE ABLA- mild and stable Lovenox/scds for dvt ppx F/u with Dr. Erlinda Hong 2 weeks post-op for staple removal     Aundra Dubin 01/05/2020, 8:02 AM

## 2020-01-05 NOTE — Plan of Care (Signed)
  Problem: Education: Goal: Verbalization of understanding the information provided (i.e., activity precautions, restrictions, etc) will improve Outcome: Progressing   Problem: Activity: Goal: Ability to ambulate and perform ADLs will improve Outcome: Progressing   Problem: Pain Management: Goal: Pain level will decrease Outcome: Progressing

## 2020-01-05 NOTE — Progress Notes (Signed)
Initial Nutrition Assessment  DOCUMENTATION CODES:   Not applicable  INTERVENTION:   -Ensure Enlive po BID, each supplement provides 350 kcal and 20 grams of protein -MVI with minerals daily  NUTRITION DIAGNOSIS:   Increased nutrient needs related to post-op healing as evidenced by estimated needs.  GOAL:   Patient will meet greater than or equal to 90% of their needs  MONITOR:   PO intake, Supplement acceptance, Weight trends, Skin, Labs, I & O's  REASON FOR ASSESSMENT:   Consult Assessment of nutrition requirement/status, Hip fracture protocol  ASSESSMENT:   Alison Rivera is a 86 y.o. female with medical history significant of mitral valve prolapse, osteoporosis, depression, hypertension presented to the ED with complaints of right hip pain after a fall which occurred shortly prior to EMS arrival  Pt admitted with rt hip fracture s/p fall.   11/29- s/p PROCEDURE: Open treatment of intertrochanteric fracture with intramedullary implant.  Reviewed I/O's: +1.3 L x 24 hours  UOP: 725 ml x 24 hours   Attempted to speak with pt via call to hospital room phone, however, unable to reach.   No complete data available to assess at this time.  Reviewed wt hx; wt has been stable over the psst several years.  Pt with increased nutritional needs due to post-operative healing and would greatly benefit from addition of oral nutrition supplements.   Medications reviewed and include colace, miralax, and 0.9% sodium chloride infusion @ 75 ml/hr.   Per chart review, plan for CIR vs SNF admission once medically stable.  Labs reviewed: Na: 132.   Diet Order:   Diet Order            Diet Heart Room service appropriate? Yes; Fluid consistency: Thin  Diet effective now                 EDUCATION NEEDS:   No education needs have been identified at this time  Skin:  Skin Assessment: Skin Integrity Issues: Skin Integrity Issues:: Incisions Incisions: closed rt hip  Last  BM:  Unknown  Height:   Ht Readings from Last 1 Encounters:  01/04/20 5\' 4"  (1.626 m)    Weight:   Wt Readings from Last 1 Encounters:  01/04/20 54.4 kg    Ideal Body Weight:  54.5 kg  BMI:  Body mass index is 20.6 kg/m.  Estimated Nutritional Needs:   Kcal:  1450-1650  Protein:  60-75 grams  Fluid:  > 1.4 L    Loistine Chance, RD, LDN, [place] Registered Dietitian II Certified Diabetes Care and Education Specialist Please refer to [facility] for RD and/or RD on-call/weekend/after hours pager

## 2020-01-05 NOTE — H&P (Signed)
Physical Medicine and Rehabilitation Admission H&P    Chief Complaint  Patient presents with  . Fall  : HPI: Alison Rivera is an 86 year old right-handed female with history of mitral valve prolapse, osteoporosis, hypertension.  History taken from chart review, daughter, and patient.  Patient lives with spouse.  Two-level home bed and bath main level 2 steps to entry.  Independent prior to admission.  Husband can assist as well as daughter who lives nearby.  She presented on 11/39/21 after falling asleep on the toilet and falling landing on her right side.  Denies LOC.  Sustained right intertrochanteric hip fracture.  Admission chemistries unremarkable except calcium 7.1, hemoglobin 10.7.  Underwent open treatment of intertrochanteric fracture with intramedullary implant on 01/04/2020 per Dr. Erlinda Hong.  Weightbearing as tolerated right lower extremity.  Lovenox for DVT prophylaxis.  Hospital course further complicated by acute blood loss anemia, 8.0 and monitored.  Patient noted a "pop" on day of admission with therapies, discussed with attending MD and stat hip x-ray ordered.  Personally reviewed, postsurgical changes.  Therapy evaluations completed the patient was admitted for a comprehensive rehab program.  Please see preadmission assessment for later today as well.  Review of Systems  Constitutional: Positive for malaise/fatigue. Negative for chills and fever.  HENT: Negative for hearing loss.   Eyes: Negative for blurred vision and double vision.  Respiratory: Negative for cough and shortness of breath.   Cardiovascular: Positive for leg swelling. Negative for chest pain and palpitations.  Gastrointestinal: Positive for constipation. Negative for heartburn, nausea and vomiting.  Genitourinary: Negative for dysuria, flank pain and hematuria.  Musculoskeletal: Positive for joint pain and myalgias.  Skin: Negative for rash.  Neurological: Positive for focal weakness and weakness. Negative for  speech change.  Psychiatric/Behavioral: Positive for depression. The patient has insomnia.   All other systems reviewed and are negative.  Past Medical History:  Diagnosis Date  . Chest pain, atypical   . Lichen planus   . Mitral valve prolapse   . Osteoporosis   . Squamous cell skin cancer    Past Surgical History:  Procedure Laterality Date  . im nail right hip Right 01/04/2020  . INTRAMEDULLARY (IM) NAIL INTERTROCHANTERIC Right 01/04/2020   Procedure: INTRAMEDULLARY (IM) NAIL INTERTROCHANTRIC;  Surgeon: Leandrew Koyanagi, MD;  Location: [facility];  Service: Orthopedics;  Laterality: Right;  . NOSE SURGERY     Family History  Problem Relation Age of Onset  . Other Mother        AD  . Congestive Heart Failure Father   . Syncope episode Father   . Parkinson's disease Brother   . Other Brother        Deep Brain Stimulator   Social History:  reports that she has never smoked. She has never used smokeless tobacco. She reports current alcohol use of about 5.0 standard drinks of alcohol per week. She reports that she does not use drugs. Allergies: No Known Allergies Medications Prior to Admission  Medication Sig Dispense Refill  . Cholecalciferol (HM VITAMIN D3) 4000 UNITS CAPS Take 1 capsule by mouth daily.    . hydrOXYzine (ATARAX/VISTARIL) 25 MG tablet Take 25 mg by mouth every 8 (eight) hours as needed for itching.    . metoprolol succinate (TOPROL-XL) 25 MG 24 hr tablet Take 25 mg by mouth daily.    . Multiple Vitamins-Minerals (CENTRUM SILVER) tablet Take 1 tablet by mouth daily.    . sertraline (ZOLOFT) 50 MG tablet Take 50 mg by  mouth daily.    Marland Kitchen zolpidem (AMBIEN) 10 MG tablet Take 5 mg by mouth at bedtime as needed for sleep.      Drug Regimen Review Drug regimen was reviewed and remains appropriate with no significant issues identified  Home: Home Living Family/patient expects to be discharged to:: Private residence Living Arrangements: Spouse/significant other Available  Help at Discharge:  (daughter, [name] , main caregiver) Type of Home: House Home Access: Stairs to enter [employer] of Steps: 2 Entrance Stairs-Rails: None Home Layout: Multi-level, Able to live on main level with bedroom/bathroom Bathroom Shower/Tub: Multimedia programmer: Standard Bathroom Accessibility: Yes Home Equipment: Shower seat - built in, [employer] - tub/shower, Hand held shower head Additional Comments: Spouse with emotional lability and some cognitive deficits per [name], daughter  Lives With: Spouse   Functional History: Prior Function Level of Independence: Independent  Functional Status:  Mobility: Bed Mobility Overal bed mobility: Needs Assistance Bed Mobility: Supine to Sit Supine to sit: Mod assist, HOB elevated General bed mobility comments: Received sitting in recliner Transfers Overall transfer level: Needs assistance Equipment used: Rolling walker (2 wheeled) Transfers: Sit to/from Stand Sit to Stand: Mod assist General transfer comment: Performed multiple sit<>stands from recliner to RW, requiring consistent modA for trunk elevation and stability; repeated cues for sequencing and hand placement Ambulation/Gait Ambulation/Gait assistance: Min assist, Mod assist Gait Distance (Feet): 12 Feet Assistive device: Rolling walker (2 wheeled) Gait Pattern/deviations: Step-to pattern, Trunk flexed, Antalgic, Decreased dorsiflexion - right, Decreased weight shift to right General Gait Details: Slow, unsteady, antalgic gait with RW; pt requiring repeated cues for step-by-step sequencing with RW; intermittent min-modA for stability and RW management; significant increased time and effort due to pain with RLE WB; improving RLE strength/motion and able to take complete step with R foot Gait velocity: Decreased Gait velocity interpretation: <1.31 ft/sec, indicative of household ambulator    ADL: ADL Overall ADL's : Needs  assistance/impaired Eating/Feeding: Independent Grooming: Wash/dry hands, Wash/dry face, Set up, Sitting Upper Body Bathing: Supervision/ safety, Set up, Sitting Lower Body Bathing: Maximal assistance, Sit to/from stand Upper Body Dressing : Minimal assistance, Sitting Lower Body Dressing: Total assistance, Sit to/from stand Toilet Transfer: Maximal assistance, Stand-pivot Toileting- Clothing Manipulation and Hygiene: Total assistance, Sit to/from stand Functional mobility during ADLs: Maximal assistance, Rolling walker General ADL Comments: Pt. needs further ed on use of AE. Pt. is limted with LE ADLs secondary to pain.   Cognition: Cognition Overall Cognitive Status: Impaired/Different from baseline Orientation Level: Oriented X4 Cognition Arousal/Alertness: Awake/alert Behavior During Therapy: WFL for tasks assessed/performed, Anxious Overall Cognitive Status: Impaired/Different from baseline Area of Impairment: Attention, Problem solving Current Attention Level: Selective Problem Solving: Requires verbal cues General Comments: WFL for simple tasks, although internally distracted by pain requiring increased cues during mobility; some anxiety with mobility to requiring max cues for redirection; not formally assessed  Physical Exam: Blood pressure (!) 119/47, pulse 72, temperature 98.2 F (36.8 C), temperature source Oral, resp. rate 16, height 5\' 4"  (1.626 m), weight 54.4 kg, SpO2 100 %. Physical Exam Vitals reviewed.  Constitutional:      General: She is not in acute distress.    Appearance: She is not ill-appearing.  HENT:     Head: Normocephalic and atraumatic.     Right Ear: External ear normal.     Left Ear: External ear normal.     Nose: Nose normal.  Eyes:     General:        Right eye: No  discharge.        Left eye: No discharge.     Extraocular Movements: Extraocular movements intact.  Cardiovascular:     Rate and Rhythm: Normal rate and regular rhythm.   Pulmonary:     Effort: Pulmonary effort is normal. No respiratory distress.     Breath sounds: No stridor.  Abdominal:     General: Abdomen is flat. There is no distension.     Comments: Bowel sounds slowed.  Musculoskeletal:     Cervical back: Normal range of motion and neck supple.     Comments: Right hip with edema and tenderness.  Skin:    Comments: Right hip with dressings CDI.  Neurological:     Mental Status: She is alert.     Comments: Alert and oriented x3 Motor: Bilateral upper extremities: 5/5 proximal distal Right lower extremity: Hip flexion, knee extension 1+/5 (pain inhibition), ankle dorsiflexion 4+/5 Left lower extremity: Hip flexion, knee extension 4 -/5 (pain inhibition), ankle dorsiflexion 4/5 Sensation intact light touch  Psychiatric:        Mood and Affect: Mood normal.        Behavior: Behavior normal.        Thought Content: Thought content normal.     Results for orders placed or performed during the hospital encounter of 01/04/20 (from the past 48 hour(s))  CBC     Status: Abnormal   Collection Time: 01/04/20  6:18 PM  Result Value Ref Range   WBC 10.5 4.0 - 10.5 K/uL   RBC 3.15 (L) 3.87 - 5.11 MIL/uL   Hemoglobin 9.5 (L) 12.0 - 15.0 g/dL   HCT 29.6 (L) 36 - 46 %   MCV 94.0 80.0 - 100.0 fL   MCH 30.2 26.0 - 34.0 pg   MCHC 32.1 30.0 - 36.0 g/dL   RDW 14.2 11.5 - 15.5 %   Platelets 161 150 - 400 K/uL   nRBC 0.0 0.0 - 0.2 %    Comment: Performed at [facility], [place] [address] [place], [place] [address]  Creatinine, serum     Status: None   Collection Time: 01/04/20  6:18 PM  Result Value Ref Range   Creatinine, Ser 0.83 0.44 - 1.00 mg/dL   GFR, Estimated >60 >60 mL/min    Comment: (NOTE) Calculated using the CKD-EPI Creatinine Equation (2021) Performed at [facility] [address] [place], [place] [address]   Basic metabolic panel     Status: Abnormal   Collection Time: 01/05/20  4:52 AM  Result Value Ref Range   Sodium  132 (L) 135 - 145 mmol/L   Potassium 4.1 3.5 - 5.1 mmol/L   Chloride 97 (L) 98 - 111 mmol/L   CO2 25 22 - 32 mmol/L   Glucose, Bld 139 (H) 70 - 99 mg/dL    Comment: Glucose reference range applies only to samples taken after fasting for at least 8 hours.   BUN 19 8 - 23 mg/dL   Creatinine, Ser 0.80 0.44 - 1.00 mg/dL   Calcium 8.7 (L) 8.9 - 10.3 mg/dL   GFR, Estimated >60 >60 mL/min    Comment: (NOTE) Calculated using the CKD-EPI Creatinine Equation (2021)    Anion gap 10 5 - 15    Comment: Performed at [facility] [address], [place], [place] [address]  CBC     Status: Abnormal   Collection Time: 01/05/20  4:52 AM  Result Value Ref Range   WBC 6.9  4.0 - 10.5 K/uL   RBC 2.81 (L) 3.87 - 5.11 MIL/uL   Hemoglobin 8.7 (L) 12.0 - 15.0 g/dL   HCT 26.4 (L) 36 - 46 %   MCV 94.0 80.0 - 100.0 fL   MCH 31.0 26.0 - 34.0 pg   MCHC 33.0 30.0 - 36.0 g/dL   RDW 14.2 11.5 - 15.5 %   Platelets 137 (L) 150 - 400 K/uL   nRBC 0.0 0.0 - 0.2 %    Comment: Performed at [facility] [address] [place], [place] [address]  Protime-INR     Status: None   Collection Time: 01/05/20  4:52 AM  Result Value Ref Range   Prothrombin Time 14.8 11.4 - 15.2 seconds   INR 1.2 0.8 - 1.2    Comment: (NOTE) INR goal varies based on device and disease states. Performed at [facility], [place] [address] [place], [place] [address]   CBC     Status: Abnormal   Collection Time: 01/06/20  1:44 AM  Result Value Ref Range   WBC 7.5 4.0 - 10.5 K/uL   RBC 2.61 (L) 3.87 - 5.11 MIL/uL   Hemoglobin 8.0 (L) 12.0 - 15.0 g/dL   HCT 24.0 (L) 36 - 46 %   MCV 92.0 80.0 - 100.0 fL   MCH 30.7 26.0 - 34.0 pg   MCHC 33.3 30.0 - 36.0 g/dL   RDW 14.1 11.5 - 15.5 %   Platelets 137 (L) 150 - 400 K/uL   nRBC 0.0 0.0 - 0.2 %    Comment: Performed at [facility], [place] [address], [place], [place] [address]  Basic metabolic panel     Status: Abnormal   Collection Time: 01/06/20  1:44 AM  Result Value  Ref Range   Sodium 132 (L) 135 - 145 mmol/L   Potassium 4.0 3.5 - 5.1 mmol/L   Chloride 99 98 - 111 mmol/L   CO2 24 22 - 32 mmol/L   Glucose, Bld 119 (H) 70 - 99 mg/dL    Comment: Glucose reference range applies only to samples taken after fasting for at least 8 hours.   BUN 20 8 - 23 mg/dL   Creatinine, Ser 0.91 0.44 - 1.00 mg/dL   Calcium 8.6 (L) 8.9 - 10.3 mg/dL   GFR, Estimated >60 >60 mL/min    Comment: (NOTE) Calculated using the CKD-EPI Creatinine Equation (2021)    Anion gap 9 5 - 15    Comment: Performed at [facility] [address], [place],  [address]   Pelvis Portable  Result Date: 01/04/2020 CLINICAL DATA:  Intramedullary nail placement for fracture EXAM: PORTABLE PELVIS 1-2 VIEWS COMPARISON:  Intraoperative radiographs right hip January 04, 2020; preoperative pelvis and right hip images January 04, 2020 FINDINGS: Frontal view obtained. There is screw and nail fixation through an intertrochanteric femur fracture on the right with alignment essentially anatomic at the fracture site. Screw tips in proximal right femur. Nail remains within intramedullary portion of bone throughout its course. No new fracture. No dislocation. Narrowing right hip joint is stable. Bony overgrowth along the superolateral aspect of the right acetabulum present. IMPRESSION: Screw and nail fixation through fracture of the intertrochanteric proximal right femur with alignment essentially anatomic in the area of fracture on frontal view. Screw tips in proximal femur. Narrowing right hip joint. Bony overgrowth along the superolateral aspect of the right acetabulum potentially places patient at increased risk for femoroacetabular impingement. Electronically Signed  By: Lowella Grip III M.D.   On: 01/04/2020 17:21   DG HIP UNILAT WITH PELVIS 2-3 VIEWS RIGHT  Result Date: 01/06/2020 CLINICAL DATA:  Onset pain after the patient felt a pop today. The patient suffered a right  intertrochanteric fracture in a fall 01/04/2020 and underwent internal fixation that same day. EXAM: DG HIP (WITH OR WITHOUT PELVIS) 2-3V RIGHT COMPARISON:  Plain films of the pelvis and right hip 01/04/2020. FINDINGS: There is no acute bony or joint abnormality. Fixation hardware for the patient's intertrochanteric fracture is intact and normal in appearance. Position and alignment of the fracture are unchanged. Attenuated appearance of the inferior pubic rami bilaterally is also unchanged. IMPRESSION: No acute abnormality. Status post fixation of a right intertrochanteric fracture. The appearance is unchanged. Electronically Signed   By: Inge Rise M.D.   On: 01/06/2020 14:35       Medical Problem List and Plan: 1.  Decreased functional ability secondary to right intertrochanteric hip fracture.  Status post open treatment intramedullary implant 01/04/2020.  Weightbearing as tolerated  -patient may shower with incision covered  -ELOS/Goals: 10-14 days/supervision/min a  Admit to CIR 2.  Antithrombotics: -DVT/anticoagulation: Lovenox.    Venous Dopplers ordered  -antiplatelet therapy: N/A 3. Pain Management: Hydrocodone and Robaxin as needed  Monitor with increased exertion 4. Mood: Zoloft 50 mg daily  -antipsychotic agents: N/A 5. Neuropsych: This patient is capable of making decisions on her own behalf. 6. Skin/Wound Care: Routine skin checks 7. Fluids/Electrolytes/Nutrition: Routine in and outs  CMP ordered for tomorrow. 8.  Acute blood loss anemia.    CBC ordered for tomorrow. 9.  Hypertension.  Toprolol 25 daily  Monitor with increased activity. 10.  Drug-induced constipation.  Colace 100 mg twice daily, MiraLAX daily  Increase bowel meds as necessary   Cathlyn Parsons, PA-C 01/06/2020  I have personally performed a face to face diagnostic evaluation, including, but not limited to relevant history and physical exam findings, of this patient and developed relevant  assessment and plan.  Additionally, I have reviewed and concur with the physician assistant's documentation above.  Delice Lesch, MD, ABPMR

## 2020-01-05 NOTE — Progress Notes (Signed)
Inpatient Rehab Admissions Coordinator Note:   Per therapy recommendations, pt was screened for CIR candidacy by Shamyia Grandpre, MS CCC-SLP. At this time, Pt. Appears to have functional decline and is a good candidate for CIR. Will place order for rehab consult per protocol.  Please contact me with questions.   Cosby Proby, MS, CCC-SLP Rehab Admissions Coordinator  [phone] (celll) [phone] (office)  

## 2020-01-06 ENCOUNTER — Encounter (HOSPITAL_COMMUNITY): Payer: Self-pay | Admitting: Physical Medicine & Rehabilitation

## 2020-01-06 ENCOUNTER — Inpatient Hospital Stay (HOSPITAL_COMMUNITY)
Admission: RE | Admit: 2020-01-06 | Discharge: 2020-01-21 | DRG: 559 | Disposition: A | Discharge status: Home Health | Payer: Medicare Other | Source: Intra-hospital | Attending: Physical Medicine & Rehabilitation | Admitting: Physical Medicine & Rehabilitation

## 2020-01-06 ENCOUNTER — Inpatient Hospital Stay (HOSPITAL_COMMUNITY): Payer: Medicare Other

## 2020-01-06 ENCOUNTER — Other Ambulatory Visit: Payer: Self-pay

## 2020-01-06 DIAGNOSIS — M62838 Other muscle spasm: Secondary | ICD-10-CM | POA: Diagnosis not present

## 2020-01-06 DIAGNOSIS — I341 Nonrheumatic mitral (valve) prolapse: Secondary | ICD-10-CM | POA: Diagnosis present

## 2020-01-06 DIAGNOSIS — K5903 Drug induced constipation: Secondary | ICD-10-CM | POA: Diagnosis present

## 2020-01-06 DIAGNOSIS — I10 Essential (primary) hypertension: Secondary | ICD-10-CM | POA: Diagnosis present

## 2020-01-06 DIAGNOSIS — E8809 Other disorders of plasma-protein metabolism, not elsewhere classified: Secondary | ICD-10-CM | POA: Diagnosis not present

## 2020-01-06 DIAGNOSIS — E43 Unspecified severe protein-calorie malnutrition: Secondary | ICD-10-CM | POA: Diagnosis present

## 2020-01-06 DIAGNOSIS — E871 Hypo-osmolality and hyponatremia: Secondary | ICD-10-CM | POA: Diagnosis present

## 2020-01-06 DIAGNOSIS — W19XXXD Unspecified fall, subsequent encounter: Secondary | ICD-10-CM | POA: Diagnosis present

## 2020-01-06 DIAGNOSIS — S72141S Displaced intertrochanteric fracture of right femur, sequela: Secondary | ICD-10-CM | POA: Diagnosis not present

## 2020-01-06 DIAGNOSIS — S72141A Displaced intertrochanteric fracture of right femur, initial encounter for closed fracture: Secondary | ICD-10-CM | POA: Diagnosis not present

## 2020-01-06 DIAGNOSIS — G8918 Other acute postprocedural pain: Secondary | ICD-10-CM | POA: Diagnosis not present

## 2020-01-06 DIAGNOSIS — Z6821 Body mass index (BMI) 21.0-21.9, adult: Secondary | ICD-10-CM

## 2020-01-06 DIAGNOSIS — M810 Age-related osteoporosis without current pathological fracture: Secondary | ICD-10-CM | POA: Diagnosis present

## 2020-01-06 DIAGNOSIS — M7989 Other specified soft tissue disorders: Secondary | ICD-10-CM | POA: Diagnosis not present

## 2020-01-06 DIAGNOSIS — R0989 Other specified symptoms and signs involving the circulatory and respiratory systems: Secondary | ICD-10-CM | POA: Diagnosis not present

## 2020-01-06 DIAGNOSIS — E872 Acidosis: Secondary | ICD-10-CM

## 2020-01-06 DIAGNOSIS — G479 Sleep disorder, unspecified: Secondary | ICD-10-CM | POA: Diagnosis not present

## 2020-01-06 DIAGNOSIS — D649 Anemia, unspecified: Secondary | ICD-10-CM

## 2020-01-06 DIAGNOSIS — Z9889 Other specified postprocedural states: Secondary | ICD-10-CM

## 2020-01-06 DIAGNOSIS — R739 Hyperglycemia, unspecified: Secondary | ICD-10-CM | POA: Diagnosis present

## 2020-01-06 DIAGNOSIS — Z79899 Other long term (current) drug therapy: Secondary | ICD-10-CM | POA: Diagnosis not present

## 2020-01-06 DIAGNOSIS — Z85828 Personal history of other malignant neoplasm of skin: Secondary | ICD-10-CM

## 2020-01-06 DIAGNOSIS — E46 Unspecified protein-calorie malnutrition: Secondary | ICD-10-CM

## 2020-01-06 DIAGNOSIS — S72141D Displaced intertrochanteric fracture of right femur, subsequent encounter for closed fracture with routine healing: Principal | ICD-10-CM

## 2020-01-06 DIAGNOSIS — M545 Low back pain, unspecified: Secondary | ICD-10-CM | POA: Diagnosis present

## 2020-01-06 DIAGNOSIS — S72144D Nondisplaced intertrochanteric fracture of right femur, subsequent encounter for closed fracture with routine healing: Secondary | ICD-10-CM

## 2020-01-06 DIAGNOSIS — E561 Deficiency of vitamin K: Secondary | ICD-10-CM

## 2020-01-06 DIAGNOSIS — D62 Acute posthemorrhagic anemia: Secondary | ICD-10-CM | POA: Diagnosis not present

## 2020-01-06 DIAGNOSIS — S72144A Nondisplaced intertrochanteric fracture of right femur, initial encounter for closed fracture: Secondary | ICD-10-CM

## 2020-01-06 DIAGNOSIS — S72001A Fracture of unspecified part of neck of right femur, initial encounter for closed fracture: Secondary | ICD-10-CM | POA: Diagnosis not present

## 2020-01-06 LAB — BASIC METABOLIC PANEL
Anion gap: 9 (ref 5–15)
BUN: 20 mg/dL (ref 8–23)
CO2: 24 mmol/L (ref 22–32)
Calcium: 8.6 mg/dL — ABNORMAL LOW (ref 8.9–10.3)
Chloride: 99 mmol/L (ref 98–111)
Creatinine, Ser: 0.91 mg/dL (ref 0.44–1.00)
GFR, Estimated: >60 mL/min (ref >60)
Glucose, Bld: 119 mg/dL — ABNORMAL HIGH (ref 70–99)
Potassium: 4 mmol/L (ref 3.5–5.1)
Sodium: 132 mmol/L — ABNORMAL LOW (ref 135–145)

## 2020-01-06 LAB — CBC
HCT: 24 % — ABNORMAL LOW (ref 36.0–46.0)
Hemoglobin: 8 g/dL — ABNORMAL LOW (ref 12.0–15.0)
MCH: 30.7 pg (ref 26.0–34.0)
MCHC: 33.3 g/dL (ref 30.0–36.0)
MCV: 92 fL (ref 80.0–100.0)
Platelets: 137 10*3/uL — ABNORMAL LOW (ref 150–400)
RBC: 2.61 MIL/uL — ABNORMAL LOW (ref 3.87–5.11)
RDW: 14.1 % (ref 11.5–15.5)
WBC: 7.5 10*3/uL (ref 4.0–10.5)
nRBC: 0 % (ref 0.0–0.2)

## 2020-01-06 MED ORDER — METHOCARBAMOL 1000 MG/10ML IJ SOLN
500.0000 mg | Freq: Four times a day (QID) | INTRAVENOUS | Status: DC | PRN
  Filled 2020-01-06: qty 5

## 2020-01-06 MED ORDER — POLYETHYLENE GLYCOL 3350 17 G PO PACK
17.0000 g | PACK | Freq: Every day | ORAL | 0 refills | Status: DC
Start: 2020-01-07 — End: 2021-01-03

## 2020-01-06 MED ORDER — SERTRALINE HCL 50 MG PO TABS
50.0000 mg | ORAL_TABLET | Freq: Every day | ORAL | Status: DC
  Administered 2020-01-07 – 2020-01-21 (×15): 50 mg via ORAL
  Filled 2020-01-06 (×15): qty 1

## 2020-01-06 MED ORDER — DOCUSATE SODIUM 100 MG PO CAPS
100.0000 mg | ORAL_CAPSULE | Freq: Two times a day (BID) | ORAL | Status: DC
  Administered 2020-01-06 – 2020-01-21 (×28): 100 mg via ORAL
  Filled 2020-01-06 (×30): qty 1

## 2020-01-06 MED ORDER — TRAZODONE HCL 50 MG PO TABS: 50.0000 mg | ORAL_TABLET | Freq: Every day | ORAL | Status: DC

## 2020-01-06 MED ORDER — POLYETHYLENE GLYCOL 3350 17 G PO PACK
17.0000 g | PACK | Freq: Every day | ORAL | Status: DC
  Administered 2020-01-07 – 2020-01-20 (×7): 17 g via ORAL
  Filled 2020-01-06 (×12): qty 1

## 2020-01-06 MED ORDER — ACETAMINOPHEN 325 MG PO TABS: 650.0000 mg | ORAL_TABLET | Freq: Four times a day (QID) | ORAL | Status: DC | PRN

## 2020-01-06 MED ORDER — ENOXAPARIN SODIUM 40 MG/0.4ML ~~LOC~~ SOLN
40.0000 mg | SUBCUTANEOUS | Status: DC
  Administered 2020-01-07 – 2020-01-20 (×14): 40 mg via SUBCUTANEOUS
  Filled 2020-01-06 (×14): qty 0.4

## 2020-01-06 MED ORDER — METOPROLOL SUCCINATE ER 25 MG PO TB24
25.0000 mg | ORAL_TABLET | Freq: Every day | ORAL | Status: DC
  Administered 2020-01-07 – 2020-01-21 (×15): 25 mg via ORAL
  Filled 2020-01-06 (×15): qty 1

## 2020-01-06 MED ORDER — HYDROCODONE-ACETAMINOPHEN 5-325 MG PO TABS
1.0000 | ORAL_TABLET | Freq: Four times a day (QID) | ORAL | Status: DC | PRN
  Administered 2020-01-06 – 2020-01-09 (×4): 1 via ORAL
  Administered 2020-01-09: 2 via ORAL
  Administered 2020-01-09: 1 via ORAL
  Administered 2020-01-10 – 2020-01-12 (×4): 2 via ORAL
  Administered 2020-01-13: 1 via ORAL
  Administered 2020-01-14: 2 via ORAL
  Administered 2020-01-14 – 2020-01-19 (×3): 1 via ORAL
  Filled 2020-01-06: qty 1
  Filled 2020-01-06: qty 2
  Filled 2020-01-06 (×2): qty 1
  Filled 2020-01-06 (×3): qty 2
  Filled 2020-01-06 (×3): qty 1
  Filled 2020-01-06 (×2): qty 2
  Filled 2020-01-06: qty 1
  Filled 2020-01-06 (×2): qty 2

## 2020-01-06 MED ORDER — ONDANSETRON HCL 4 MG PO TABS: 4.0000 mg | ORAL_TABLET | Freq: Four times a day (QID) | ORAL | Status: DC | PRN

## 2020-01-06 MED ORDER — TRAZODONE HCL 50 MG PO TABS: 50.0000 mg | ORAL_TABLET | Freq: Every day | ORAL | 0 refills | Status: DC

## 2020-01-06 MED ORDER — DOCUSATE SODIUM 100 MG PO CAPS
100.0000 mg | ORAL_CAPSULE | Freq: Two times a day (BID) | ORAL | 0 refills | Status: DC
Start: 2020-01-06 — End: 2021-01-03

## 2020-01-06 MED ORDER — SORBITOL 70 % SOLN: 30.0000 mL | Freq: Every day | Status: DC | PRN

## 2020-01-06 MED ORDER — METHOCARBAMOL 500 MG PO TABS
500.0000 mg | ORAL_TABLET | Freq: Four times a day (QID) | ORAL | Status: DC | PRN
  Administered 2020-01-06 – 2020-01-13 (×8): 500 mg via ORAL
  Filled 2020-01-06 (×8): qty 1

## 2020-01-06 MED ORDER — ONDANSETRON HCL 4 MG/2ML IJ SOLN: 4.0000 mg | Freq: Four times a day (QID) | INTRAMUSCULAR | Status: DC | PRN

## 2020-01-06 MED ORDER — ACETAMINOPHEN 325 MG PO TABS
650.0000 mg | ORAL_TABLET | Freq: Four times a day (QID) | ORAL | Status: DC | PRN
  Administered 2020-01-07 – 2020-01-08 (×2): 650 mg via ORAL
  Filled 2020-01-06 (×3): qty 2

## 2020-01-06 MED ORDER — TRAZODONE HCL 50 MG PO TABS
25.0000 mg | ORAL_TABLET | Freq: Every day | ORAL | Status: DC
  Administered 2020-01-06 – 2020-01-07 (×2): 25 mg via ORAL
  Filled 2020-01-06 (×2): qty 1

## 2020-01-06 MED ORDER — ENOXAPARIN SODIUM 40 MG/0.4ML ~~LOC~~ SOLN: 40.0000 mg | SUBCUTANEOUS | Status: DC

## 2020-01-06 MED ORDER — VITAMIN B-12 100 MCG PO TABS
100.0000 ug | ORAL_TABLET | Freq: Every day | ORAL | Status: DC
  Administered 2020-01-07 – 2020-01-21 (×15): 100 ug via ORAL
  Filled 2020-01-06 (×16): qty 1

## 2020-01-06 NOTE — PMR Pre-admission (Signed)
PMR Admission Coordinator Pre-Admission Assessment  Patient: Alison Rivera is an 86 y.o., female MRN: [id number] DOB: [birth year] Height: $RemoveBeforeDE'5\' 4"'CqoinnjPMaiuByx$  (162.6 cm) Weight: 54.4 kg  Insurance Information HMO:     PPO:      PCP:      IPA:      80/20:      OTHER:  PRIMARY: Medicare a and b      Policy#: [id number]      Subscriber: pt Benefits:  Phone #: online     Name: 11/30 Eff. Date: 02/05/1998     Deduct: $1484      Out of Pocket Max: none      Life Max: none CIR: 100%      SNF: 20 full days Outpatient: 80%     Co-Pay: 20% Home Health: 100%      Co-Pay: none DME: 80%     Co-Pay: 20% Providers: pt choice  SECONDARY: [employer]      Policy#: [id number]  Financial Counselor:       Phone#:   The "Data Collection Information Summary" for patients in Inpatient Rehabilitation Facilities with attached "Privacy Act [place] Records" was provided and verbally reviewed with: Patient  Emergency Contact Information Contact Information    Name Relation Home Work Mobile   Alison Rivera [phone]        Current Medical History  Patient Admitting Diagnosis: hip fracture  History of Present Illness: 86 year old right-handed female with history of mitral valve prolapse, osteoporosis, hypertension.   Presented 01/04/2020 after falling asleep on the toilet and falling landing on her right side.  Denied loss of consciousness.  Sustained right intertrochanteric hip fracture.  Admission chemistries unremarkable except calcium 7.1, hemoglobin 10.7.  Underwent open treatment of intertrochanteric fracture with intramedullary implant 01/04/2020 per Dr. Erlinda Hong.  Weightbearing as tolerated right lower extremity.  Lovenox for DVT prophylaxis.  Acute blood loss anemia 8.0 and monitored.    Patient's medical record from [facility] has been reviewed by the rehabilitation admission coordinator and physician.  Past Medical History  Past Medical History:  Diagnosis Date  . Chest pain,  atypical   . Lichen planus   . Mitral valve prolapse   . Osteoporosis   . Squamous cell skin cancer     Family History   family history includes Congestive Heart Failure in her father; Other in her brother and mother; Parkinson's disease in her brother; Syncope episode in her father.  Prior Rehab/Hospitalizations Has the patient had prior rehab or hospitalizations prior to admission? Yes  Has the patient had major surgery during 100 days prior to admission? Yes   Current Medications  Current Facility-Administered Medications:  .  0.9 %  sodium chloride infusion, , Intravenous, Continuous, Leandrew Koyanagi, MD, Last Rate: 75 mL/hr at 01/04/20 1829, New Bag at 01/04/20 1829 .  acetaminophen (TYLENOL) tablet 650 mg, 650 mg, Oral, Q6H PRN, Regalado, Belkys A, MD, 650 mg at 01/05/20 2333 .  alum & mag hydroxide-simeth (MAALOX/MYLANTA) 200-200-20 MG/5ML suspension 30 mL, 30 mL, Oral, Q4H PRN, Leandrew Koyanagi, MD .  docusate sodium (COLACE) capsule 100 mg, 100 mg, Oral, BID, Leandrew Koyanagi, MD, 100 mg at 01/06/20 0901 .  enoxaparin (LOVENOX) injection 40 mg, 40 mg, Subcutaneous, Q24H, Leandrew Koyanagi, MD, 40 mg at 01/05/20 1646 .  HYDROcodone-acetaminophen (NORCO/VICODIN) 5-325 MG per tablet 1-2 tablet, 1-2 tablet, Oral, Q6H PRN, Leandrew Koyanagi, MD, 1 tablet at 01/06/20 0630 .  magnesium citrate solution 1  Bottle, 1 Bottle, Oral, Once PRN, Leandrew Koyanagi, MD .  menthol-cetylpyridinium (CEPACOL) lozenge 3 mg, 1 lozenge, Oral, PRN **OR** phenol (CHLORASEPTIC) mouth spray 1 spray, 1 spray, Mouth/Throat, PRN, Leandrew Koyanagi, MD .  methocarbamol (ROBAXIN) tablet 500 mg, 500 mg, Oral, Q6H PRN, 500 mg at 01/05/20 2334 **OR** methocarbamol (ROBAXIN) 500 mg in dextrose 5 % 50 mL IVPB, 500 mg, Intravenous, Q6H PRN, Leandrew Koyanagi, MD .  metoprolol succinate (TOPROL-XL) 24 hr tablet 25 mg, 25 mg, Oral, Daily, Leandrew Koyanagi, MD, 25 mg at 01/06/20 0900 .  morphine 2 MG/ML injection 0.5 mg, 0.5 mg, Intravenous, Q2H PRN,  Leandrew Koyanagi, MD, 0.5 mg at 01/04/20 1825 .  ondansetron (ZOFRAN) tablet 4 mg, 4 mg, Oral, Q6H PRN **OR** ondansetron (ZOFRAN) injection 4 mg, 4 mg, Intravenous, Q6H PRN, Leandrew Koyanagi, MD .  polyethylene glycol (MIRALAX / GLYCOLAX) packet 17 g, 17 g, Oral, Daily, Regalado, Belkys A, MD, 17 g at 01/06/20 0902 .  sertraline (ZOLOFT) tablet 50 mg, 50 mg, Oral, Daily, Leandrew Koyanagi, MD, 50 mg at 01/06/20 0901 .  sorbitol 70 % solution 30 mL, 30 mL, Oral, Daily PRN, Leandrew Koyanagi, MD, 30 mL at 01/06/20 0630 .  vitamin B-12 (CYANOCOBALAMIN) tablet 100 mcg, 100 mcg, Oral, Daily, Regalado, Belkys A, MD, 100 mcg at 01/06/20 0900  Patients Current Diet:  Diet Order            Diet Heart Room service appropriate? Yes; Fluid consistency: Thin  Diet effective now                 Precautions / Restrictions Precautions Precautions: Fall Restrictions Weight Bearing Restrictions: Yes RLE Weight Bearing: Weight bearing as tolerated   Has the patient had 2 or more falls or a fall with injury in the past year? Yes  Prior Activity Level Community (5-7x/wk): Independent and driving  Prior Functional Level Self Care: Did the patient need help bathing, dressing, using the toilet or eating? Independent  Indoor Mobility: Did the patient need assistance with walking from room to room (with or without device)? Independent  Stairs: Did the patient need assistance with internal or external stairs (with or without device)? Independent  Functional Cognition: Did the patient need help planning regular tasks such as shopping or remembering to take medications? Independent  Home Assistive Devices / Equipment Home Assistive Devices/Equipment: Eyeglasses Home Equipment: Shower seat - built in, Grab bars - tub/shower, Hand held shower head  Prior Device Use: Indicate devices/aids used by the patient prior to current illness, exacerbation or injury? None of the above  Current Functional Level Cognition   Overall Cognitive Status: Within Functional Limits for tasks assessed Orientation Level: Oriented X4 General Comments: WFL for simple tasks, although internally distracted by pain requiring increased cues during mobility; not formally assessed    Extremity Assessment (includes Sensation/Coordination)  Upper Extremity Assessment: Generalized weakness  Lower Extremity Assessment: Defer to PT evaluation RLE Deficits / Details: s/p R hip nailing; able to wiggle toes, partial knee ext/flex, minimal ankle PF/DF and minimal hip flex - limited by post-op pain and weakness    ADLs  Overall ADL's : Needs assistance/impaired Eating/Feeding: Independent Grooming: Wash/dry hands, Wash/dry face, Set up, Sitting Upper Body Bathing: Supervision/ safety, Set up, Sitting Lower Body Bathing: Maximal assistance, Sit to/from stand Upper Body Dressing : Minimal assistance, Sitting Lower Body Dressing: Total assistance, Sit to/from stand Toilet Transfer: Maximal assistance, Stand-pivot Toileting- Clothing Manipulation and Hygiene: Total assistance,  Sit to/from stand Functional mobility during ADLs: Maximal assistance, Rolling walker General ADL Comments: Pt. needs further ed on use of AE. Pt. is limted with LE ADLs secondary to pain.     Mobility  Overal bed mobility: Needs Assistance Bed Mobility: Supine to Sit Supine to sit: Mod assist, HOB elevated General bed mobility comments: ModA for RLE management, scooting hips to EOB and assisting trunk elevation, cued to use bed rail to assist    Transfers  Overall transfer level: Needs assistance Equipment used: Rolling walker (2 wheeled) Transfers: Sit to/from Stand Sit to Stand: Max assist General transfer comment: Unable to stand on initial attempt, requiring return to sit and totalA to flex R knee in preparation to stand, cues for foot/hand placement and sequencing; able to stand on second trial with modA for trunk elevation and stability; maxA for  eccentric control into sitting, limited by pain and weakness    Ambulation / Gait / Stairs / Wheelchair Mobility  Ambulation/Gait Ambulation/Gait assistance: Mod assist Gait Distance (Feet): 2 Feet Assistive device: Rolling walker (2 wheeled) Gait Pattern/deviations: Step-to pattern, Trunk flexed, Leaning posteriorly, Antalgic, Decreased dorsiflexion - right, Decreased weight shift to right General Gait Details: Significant increased time and effort initiating gait due to RLE post-op pain and weakness, requiring RW and modA to take a few steps to recliner; max cues for sequencing, unable to take complete step with RLE, reliant on sliding foot; difficulty accepting weight onto RLE in order to step with LLE Gait velocity: Decreased    Posture / Balance Balance Overall balance assessment: Needs assistance Sitting balance-Leahy Scale: Fair Standing balance-Leahy Scale: Poor Standing balance comment: Reliant on UE support and external assist    Special needs/care consideration Spouse with some cognitive deficits so daughters will be visiting along with spouse   Previous Home Environment  Living Arrangements: Spouse/significant other  Lives With: Spouse Available Help at Discharge:  (daughter, [name] , main caregiver) Type of Home: House Home Layout: Multi-level, Able to live on main level with bedroom/bathroom Home Access: Stairs to enter Entrance Stairs-Rails: None Entrance Stairs-Number of Steps: 2 Bathroom Shower/Tub: Multimedia programmer: Standard Bathroom Accessibility: Yes How Accessible: Accessible via walker [place]: No Additional Comments: Spouse with emotional lability and some cognitive deficits per [name], daughter  Discharge Living Setting Plans for Discharge Living Setting: Patient's home, Lives with (comment) (spouse) Type of Home at Discharge: House Discharge Home Layout: Multi-level, Able to live on main level with  bedroom/bathroom Discharge Home Access: Stairs to enter Entrance Stairs-Rails: None Entrance Stairs-Number of Steps: 2 Discharge Bathroom Shower/Tub: Walk-in shower Discharge Bathroom Toilet: Standard Discharge Bathroom Accessibility: Yes How Accessible: Accessible via walker Does the patient have any problems obtaining your medications?: No  Social/Family/Support Systems Patient Roles: Spouse, Caregiver Contact Information: [name], daughter , main contact; spouse with some cognitive deficits Anticipated Caregiver: 2 daughters and spouse Anticipated Caregiver's Contact Information: [name] [phone] Ability/Limitations of Caregiver: spouse with some ciognitive deficits Caregiver Availability: 24/7 Discharge Plan Discussed with Primary Caregiver: Yes Is Caregiver In Agreement with Plan?: Yes Does Caregiver/Family have Issues with Lodging/Transportation while Pt is in Rehab?: No  Goals Patient/Family Goal for Rehab: Mod I to supervision with PT and OT Expected length of stay: ELOS 10 to 14 days Pt/Family Agrees to Admission and willing to participate: Yes Program Orientation Provided & Reviewed with Pt/Caregiver Including Roles  & Responsibilities: Yes  Decrease burden of Care through IP rehab admission: n/a  Possible need for SNF placement upon discharge: not  anticipated  Patient Condition: I have reviewed medical records from [facility], spoken with CM, and patient, spouse and daughter. I met with patient at the bedside for inpatient rehabilitation assessment.  Patient will benefit from ongoing PT and OT, can actively participate in 3 hours of therapy a day 5 days of the week, and can make measurable gains during the admission.  Patient will also benefit from the coordinated team approach during an Inpatient Acute Rehabilitation admission.  The patient will receive intensive therapy as well as Rehabilitation physician, nursing, social worker, and care management  interventions.  Due to bladder management, bowel management, safety, skin/wound care, disease management, medication administration, pain management and patient education the patient requires 24 hour a day rehabilitation nursing.  The patient is currently mod assist  with mobility and basic ADLs.  Discharge setting and therapy post discharge at home with home health is anticipated.  Patient has agreed to participate in the Acute Inpatient Rehabilitation Program and will admit today.  Preadmission Screen Completed By:  Cleatrice Burke, 01/06/2020 12:03 PM ______________________________________________________________________   Discussed status with Dr. Posey Pronto  on  01/06/2020  at  52 and received approval for admission today.  Admission Coordinator:  Cleatrice Burke, RN, time  1210 Date  01/06/2020   Assessment/Plan: Diagnosis: Right intertrochanteric hip fracture  1. Does the need for close, 24 hr/day Medical supervision in concert with the patient's rehab needs make it unreasonable for this patient to be served in a less intensive setting? Yes  2. Co-Morbidities requiring supervision/potential complications: mitral valve prolapse, osteoporosis, HTN (monitor and provide prns in accordance with increased physical exertion and pain), 3. Due to bowel management, safety, skin/wound care, disease management, pain management and patient education, does the patient require 24 hr/day rehab nursing? Yes 4. Does the patient require coordinated care of a physician, rehab nurse, PT, OT to address physical and functional deficits in the context of the above medical diagnosis(es)? Yes Addressing deficits in the following areas: balance, endurance, locomotion, strength, transferring, bowel/bladder control, bathing, dressing, toileting and psychosocial support 5. Can the patient actively participate in an intensive therapy program of at least 3 hrs of therapy 5 days a week? Yes 6. The potential for  patient to make measurable gains while on inpatient rehab is excellent 7. Anticipated functional outcomes upon discharge from inpatient rehab: supervision and min assist PT, supervision and min assist OT, n/a SLP 8. Estimated rehab length of stay to reach the above functional goals is: 10-14 days. 9. Anticipated discharge destination: Home 10. Overall Rehab/Functional Prognosis: good   MD Signature: Delice Lesch, MD, ABPMR

## 2020-01-06 NOTE — Progress Notes (Signed)
Physical Therapy Treatment Patient Details Name: Alison Rivera MRN: [id number] DOB: 1931-10-21 Today's Date: 01/06/2020    History of Present Illness Pt is an 86 y.o. female admitted 01/04/20 after falling asleep on commode with fall onto R-side sustaining R hip pain. Imaging showed R femur intertrochanteric fx. S/p R hip IM implant 11/29. PMH includes osteoporosis.   PT Comments    Pt progressing with mobility. Today's session focused on transfer and gait training with RW, pt requiring up to [facility] for mobility; remains limited by pain, generalized weakness, decreased activity tolerance and impaired balance; at high risk for falls. Pt c/o dizziness with ambulation (see BP values below); RN notified. Continue to recommend intensive CIR-level therapies to maximize functional mobility and independence.  Orthostatic Bps (*Unsure accuracy of values -- cuff size too large and smaller size not in stock on unit) Sitting 108/68  Standing 79/37  Post-transfer 95/56      Follow Up Recommendations  CIR;Supervision for mobility/OOB     Equipment Recommendations  Rolling walker with 5" wheels;3in1 (PT);Wheelchair (measurements PT);Wheelchair cushion (measurements PT)    Recommendations for Other Services       Precautions / Restrictions Precautions Precautions: Fall Restrictions Weight Bearing Restrictions: Yes RLE Weight Bearing: Weight bearing as tolerated    Mobility  Bed Mobility               General bed mobility comments: Received sitting in recliner  Transfers Overall transfer level: Needs assistance Equipment used: Rolling walker (2 wheeled) Transfers: Sit to/from Stand Sit to Stand: Mod assist         General transfer comment: Performed multiple sit<>stands from recliner to RW, requiring consistent modA for trunk elevation and stability; repeated cues for sequencing and hand placement  Ambulation/Gait Ambulation/Gait assistance: Min assist;Mod assist Gait Distance  (Feet): 12 Feet Assistive device: Rolling walker (2 wheeled) Gait Pattern/deviations: Step-to pattern;Trunk flexed;Antalgic;Decreased dorsiflexion - right;Decreased weight shift to right Gait velocity: Decreased Gait velocity interpretation: <1.31 ft/sec, indicative of household ambulator General Gait Details: Slow, unsteady, antalgic gait with RW; pt requiring repeated cues for step-by-step sequencing with RW; intermittent min-modA for stability and RW management; significant increased time and effort due to pain with RLE WB; improving RLE strength/motion and able to take complete step with R foot   Stairs             Wheelchair Mobility    Modified Rankin (Stroke Patients Only)       Balance Overall balance assessment: Needs assistance   Sitting balance-Leahy Scale: Fair       Standing balance-Leahy Scale: Poor Standing balance comment: Reliant on UE support and external assist                            Cognition Arousal/Alertness: Awake/alert Behavior During Therapy: WFL for tasks assessed/performed;Anxious Overall Cognitive Status: Impaired/Different from baseline Area of Impairment: Attention;Problem solving                   Current Attention Level: Selective         Problem Solving: Requires verbal cues General Comments: WFL for simple tasks, although internally distracted by pain requiring increased cues during mobility; some anxiety with mobility to requiring max cues for redirection; not formally assessed      Exercises General Exercises - Lower Extremity Long Arc Quad: AROM;Right;Seated    General Comments General comments (skin integrity, edema, etc.): Daughter present and supportive. Pt c/o lightheadedness with  ambulation (BP down to 79/37; unsure if reliable secondary to too large cuff size and smaller size not in stock on unit; RN aware)      Pertinent Vitals/Pain Pain Assessment: Faces Faces Pain Scale: Hurts even  more Pain Location: RLE Pain Descriptors / Indicators: Grimacing;Guarding;Discomfort;Moaning Pain Intervention(s): Limited activity within patient's tolerance;Monitored during session;Premedicated before session    Home Living     Available Help at Discharge:  (daughter, [name] , main caregiver)           Additional Comments: Spouse with emotional lability and some cognitive deficits per [name], daughter    Prior Function            PT Goals (current goals can now be found in the care plan section) Progress towards PT goals: Progressing toward goals    Frequency    Min 5X/week      PT Plan Current plan remains appropriate    Co-evaluation              AM-PAC PT "6 Clicks" Mobility   Outcome Measure  Help needed turning from your back to your side while in a flat bed without using bedrails?: A Lot Help needed moving from lying on your back to sitting on the side of a flat bed without using bedrails?: A Lot Help needed moving to and from a bed to a chair (including a wheelchair)?: A Lot Help needed standing up from a chair using your arms (e.g., wheelchair or bedside chair)?: A Lot Help needed to walk in hospital room?: A Lot Help needed climbing 3-5 steps with a railing? : A Lot 6 Click Score: 12    End of Session Equipment Utilized During Treatment: Gait belt Activity Tolerance: Patient tolerated treatment well;Patient limited by pain Patient left: with call bell/phone within reach;with chair alarm set;with family/visitor present (sitting on BSC to void; pt reports will need a long time; RN/NT aware and family present) Nurse Communication: Mobility status PT Visit Diagnosis: Other abnormalities of gait and mobility (R26.89);Pain Pain - Right/Left: Right Pain - part of body: Leg;Hip     Time: 5643-3295 PT Time Calculation (min) (ACUTE ONLY): 30 min  Charges:  $Gait Training: 8-22 mins $Therapeutic Activity: 8-22 mins                    Mabeline Caras, PT, DPT Acute Rehabilitation Services  Pager [phone] Office [place] 01/06/2020, 12:27 PM

## 2020-01-06 NOTE — Progress Notes (Signed)
Subjective: 2 Days Post-Op Procedure(s) (LRB): INTRAMEDULLARY (IM) NAIL INTERTROCHANTRIC (Right) Patient reports pain as mild.    Objective: Vital signs in last 24 hours: Temp:  [97.5 F (36.4 C)-99.2 F (37.3 C)] 99.1 F (37.3 C) (12/01 0420) Pulse Rate:  [57-70] 67 (12/01 0420) Resp:  [15-18] 16 (12/01 0420) BP: (101-117)/(38-52) 117/42 (12/01 0420) SpO2:  [93 %-97 %] 96 % (12/01 0420)  Intake/Output from previous day: 11/30 0701 - 12/01 0700 In: 720 [P.O.:720] Out: -  Intake/Output this shift: No intake/output data recorded.  Recent Labs    01/04/20 0248 01/04/20 1818 01/05/20 0452 01/06/20 0144  HGB 10.7* 9.5* 8.7* 8.0*   Recent Labs    01/05/20 0452 01/06/20 0144  WBC 6.9 7.5  RBC 2.81* 2.61*  HCT 26.4* 24.0*  PLT 137* 137*   Recent Labs    01/05/20 0452 01/06/20 0144  NA 132* 132*  K 4.1 4.0  CL 97* 99  CO2 25 24  BUN 19 20  CREATININE 0.80 0.91  GLUCOSE 139* 119*  CALCIUM 8.7* 8.6*   Recent Labs    01/04/20 1337 01/05/20 0452  INR 1.1 1.2    Neurologically intact Neurovascular intact Sensation intact distally Intact pulses distally Dorsiflexion/Plantar flexion intact Incision: dressing C/D/I No cellulitis present Compartment soft   Assessment/Plan: 2 Days Post-Op Procedure(s) (LRB): INTRAMEDULLARY (IM) NAIL INTERTROCHANTRIC (Right) Up with therapy WBAT RLE ABLA- mild and stable Lovenox/scds for dvt ppx F/u with Dr. Erlinda Hong 2 weeks post-op for staple removal     Aundra Dubin 01/06/2020, 8:06 AM

## 2020-01-06 NOTE — H&P (Signed)
Physical Medicine and Rehabilitation Admission H&P    Chief Complaint  Patient presents with  . Fall  : HPI: Alison Rivera is an 86 year old right-handed female with history of mitral valve prolapse, osteoporosis, hypertension.  History taken from chart review, daughter, and patient.  Patient lives with spouse.  Two-level home bed and bath main level 2 steps to entry.  Independent prior to admission.  Husband can assist as well as daughter who lives nearby.  She presented on 11/39/21 after falling asleep on the toilet and falling landing on her right side.  Denies LOC.  Sustained right intertrochanteric hip fracture.  Admission chemistries unremarkable except calcium 7.1, hemoglobin 10.7.  Underwent open treatment of intertrochanteric fracture with intramedullary implant on 01/04/2020 per Dr. Erlinda Hong.  Weightbearing as tolerated right lower extremity.  Lovenox for DVT prophylaxis.  Hospital course further complicated by acute blood loss anemia, 8.0 and monitored.  Patient noted a "pop" on day of admission with therapies, discussed with attending MD and stat hip x-ray ordered.  Personally reviewed, postsurgical changes.  Therapy evaluations completed the patient was admitted for a comprehensive rehab program.  Please see preadmission assessment for later today as well.  Review of Systems  Constitutional: Positive for malaise/fatigue. Negative for chills and fever.  HENT: Negative for hearing loss.   Eyes: Negative for blurred vision and double vision.  Respiratory: Negative for cough and shortness of breath.   Cardiovascular: Positive for leg swelling. Negative for chest pain and palpitations.  Gastrointestinal: Positive for constipation. Negative for heartburn, nausea and vomiting.  Genitourinary: Negative for dysuria, flank pain and hematuria.  Musculoskeletal: Positive for joint pain and myalgias.  Skin: Negative for rash.  Neurological: Positive for focal weakness and weakness. Negative for  speech change.  Psychiatric/Behavioral: Positive for depression. The patient has insomnia.   All other systems reviewed and are negative.  Past Medical History:  Diagnosis Date  . Chest pain, atypical   . Lichen planus   . Mitral valve prolapse   . Osteoporosis   . Squamous cell skin cancer    Past Surgical History:  Procedure Laterality Date  . im nail right hip Right 01/04/2020  . INTRAMEDULLARY (IM) NAIL INTERTROCHANTERIC Right 01/04/2020   Procedure: INTRAMEDULLARY (IM) NAIL INTERTROCHANTRIC;  Surgeon: Leandrew Koyanagi, MD;  Location: [facility];  Service: Orthopedics;  Laterality: Right;  . NOSE SURGERY     Family History  Problem Relation Age of Onset  . Other Mother        AD  . Congestive Heart Failure Father   . Syncope episode Father   . Parkinson's disease Brother   . Other Brother        Deep Brain Stimulator   Social History:  reports that she has never smoked. She has never used smokeless tobacco. She reports current alcohol use of about 5.0 standard drinks of alcohol per week. She reports that she does not use drugs. Allergies: No Known Allergies Medications Prior to Admission  Medication Sig Dispense Refill  . Cholecalciferol (HM VITAMIN D3) 4000 UNITS CAPS Take 1 capsule by mouth daily.    . hydrOXYzine (ATARAX/VISTARIL) 25 MG tablet Take 25 mg by mouth every 8 (eight) hours as needed for itching.    . metoprolol succinate (TOPROL-XL) 25 MG 24 hr tablet Take 25 mg by mouth daily.    . Multiple Vitamins-Minerals (CENTRUM SILVER) tablet Take 1 tablet by mouth daily.    . sertraline (ZOLOFT) 50 MG tablet Take 50 mg by  mouth daily.    Marland Kitchen zolpidem (AMBIEN) 10 MG tablet Take 5 mg by mouth at bedtime as needed for sleep.      Drug Regimen Review Drug regimen was reviewed and remains appropriate with no significant issues identified  Home: Home Living Family/patient expects to be discharged to:: Private residence Living Arrangements: Spouse/significant other Available  Help at Discharge:  (daughter, [name] , main caregiver) Type of Home: House Home Access: Stairs to enter [employer] of Steps: 2 Entrance Stairs-Rails: None Home Layout: Multi-level, Able to live on main level with bedroom/bathroom Bathroom Shower/Tub: Multimedia programmer: Standard Bathroom Accessibility: Yes Home Equipment: Shower seat - built in, [employer] - tub/shower, Hand held shower head Additional Comments: Spouse with emotional lability and some cognitive deficits per [name], daughter  Lives With: Spouse   Functional History: Prior Function Level of Independence: Independent  Functional Status:  Mobility: Bed Mobility Overal bed mobility: Needs Assistance Bed Mobility: Supine to Sit Supine to sit: Mod assist, HOB elevated General bed mobility comments: Received sitting in recliner Transfers Overall transfer level: Needs assistance Equipment used: Rolling walker (2 wheeled) Transfers: Sit to/from Stand Sit to Stand: Mod assist General transfer comment: Performed multiple sit<>stands from recliner to RW, requiring consistent modA for trunk elevation and stability; repeated cues for sequencing and hand placement Ambulation/Gait Ambulation/Gait assistance: Min assist, Mod assist Gait Distance (Feet): 12 Feet Assistive device: Rolling walker (2 wheeled) Gait Pattern/deviations: Step-to pattern, Trunk flexed, Antalgic, Decreased dorsiflexion - right, Decreased weight shift to right General Gait Details: Slow, unsteady, antalgic gait with RW; pt requiring repeated cues for step-by-step sequencing with RW; intermittent min-modA for stability and RW management; significant increased time and effort due to pain with RLE WB; improving RLE strength/motion and able to take complete step with R foot Gait velocity: Decreased Gait velocity interpretation: <1.31 ft/sec, indicative of household ambulator    ADL: ADL Overall ADL's : Needs  assistance/impaired Eating/Feeding: Independent Grooming: Wash/dry hands, Wash/dry face, Set up, Sitting Upper Body Bathing: Supervision/ safety, Set up, Sitting Lower Body Bathing: Maximal assistance, Sit to/from stand Upper Body Dressing : Minimal assistance, Sitting Lower Body Dressing: Total assistance, Sit to/from stand Toilet Transfer: Maximal assistance, Stand-pivot Toileting- Clothing Manipulation and Hygiene: Total assistance, Sit to/from stand Functional mobility during ADLs: Maximal assistance, Rolling walker General ADL Comments: Pt. needs further ed on use of AE. Pt. is limted with LE ADLs secondary to pain.   Cognition: Cognition Overall Cognitive Status: Impaired/Different from baseline Orientation Level: Oriented X4 Cognition Arousal/Alertness: Awake/alert Behavior During Therapy: WFL for tasks assessed/performed, Anxious Overall Cognitive Status: Impaired/Different from baseline Area of Impairment: Attention, Problem solving Current Attention Level: Selective Problem Solving: Requires verbal cues General Comments: WFL for simple tasks, although internally distracted by pain requiring increased cues during mobility; some anxiety with mobility to requiring max cues for redirection; not formally assessed  Physical Exam: Blood pressure (!) 119/47, pulse 72, temperature 98.2 F (36.8 C), temperature source Oral, resp. rate 16, height 5\' 4"  (1.626 m), weight 54.4 kg, SpO2 100 %. Physical Exam Vitals reviewed.  Constitutional:      General: She is not in acute distress.    Appearance: She is not ill-appearing.  HENT:     Head: Normocephalic and atraumatic.     Right Ear: External ear normal.     Left Ear: External ear normal.     Nose: Nose normal.  Eyes:     General:        Right eye: No  discharge.        Left eye: No discharge.     Extraocular Movements: Extraocular movements intact.  Cardiovascular:     Rate and Rhythm: Normal rate and regular rhythm.   Pulmonary:     Effort: Pulmonary effort is normal. No respiratory distress.     Breath sounds: No stridor.  Abdominal:     General: Abdomen is flat. There is no distension.     Comments: Bowel sounds slowed.  Musculoskeletal:     Cervical back: Normal range of motion and neck supple.     Comments: Right hip with edema and tenderness.  Skin:    Comments: Right hip with dressings CDI.  Neurological:     Mental Status: She is alert.     Comments: Alert and oriented x3 Motor: Bilateral upper extremities: 5/5 proximal distal Right lower extremity: Hip flexion, knee extension 1+/5 (pain inhibition), ankle dorsiflexion 4+/5 Left lower extremity: Hip flexion, knee extension 4 -/5 (pain inhibition), ankle dorsiflexion 4/5 Sensation intact light touch  Psychiatric:        Mood and Affect: Mood normal.        Behavior: Behavior normal.        Thought Content: Thought content normal.     Results for orders placed or performed during the hospital encounter of 01/04/20 (from the past 48 hour(s))  CBC     Status: Abnormal   Collection Time: 01/04/20  6:18 PM  Result Value Ref Range   WBC 10.5 4.0 - 10.5 K/uL   RBC 3.15 (L) 3.87 - 5.11 MIL/uL   Hemoglobin 9.5 (L) 12.0 - 15.0 g/dL   HCT 29.6 (L) 36 - 46 %   MCV 94.0 80.0 - 100.0 fL   MCH 30.2 26.0 - 34.0 pg   MCHC 32.1 30.0 - 36.0 g/dL   RDW 14.2 11.5 - 15.5 %   Platelets 161 150 - 400 K/uL   nRBC 0.0 0.0 - 0.2 %    Comment: Performed at [facility], [place] [address], [place], [place] [address]  Creatinine, serum     Status: None   Collection Time: 01/04/20  6:18 PM  Result Value Ref Range   Creatinine, Ser 0.83 0.44 - 1.00 mg/dL   GFR, Estimated >60 >60 mL/min    Comment: (NOTE) Calculated using the CKD-EPI Creatinine Equation (2021) Performed at [facility] [address], [place], [place] [address]   Basic metabolic panel     Status: Abnormal   Collection Time: 01/05/20  4:52 AM  Result Value Ref Range   Sodium  132 (L) 135 - 145 mmol/L   Potassium 4.1 3.5 - 5.1 mmol/L   Chloride 97 (L) 98 - 111 mmol/L   CO2 25 22 - 32 mmol/L   Glucose, Bld 139 (H) 70 - 99 mg/dL    Comment: Glucose reference range applies only to samples taken after fasting for at least 8 hours.   BUN 19 8 - 23 mg/dL   Creatinine, Ser 0.80 0.44 - 1.00 mg/dL   Calcium 8.7 (L) 8.9 - 10.3 mg/dL   GFR, Estimated >60 >60 mL/min    Comment: (NOTE) Calculated using the CKD-EPI Creatinine Equation (2021)    Anion gap 10 5 - 15    Comment: Performed at [facility] [address], [place], [place] [address]  CBC     Status: Abnormal   Collection Time: 01/05/20  4:52 AM  Result Value Ref Range   WBC 6.9  4.0 - 10.5 K/uL   RBC 2.81 (L) 3.87 - 5.11 MIL/uL   Hemoglobin 8.7 (L) 12.0 - 15.0 g/dL   HCT 26.4 (L) 36 - 46 %   MCV 94.0 80.0 - 100.0 fL   MCH 31.0 26.0 - 34.0 pg   MCHC 33.0 30.0 - 36.0 g/dL   RDW 14.2 11.5 - 15.5 %   Platelets 137 (L) 150 - 400 K/uL   nRBC 0.0 0.0 - 0.2 %    Comment: Performed at [facility] [address] [place], [place] [address]  Protime-INR     Status: None   Collection Time: 01/05/20  4:52 AM  Result Value Ref Range   Prothrombin Time 14.8 11.4 - 15.2 seconds   INR 1.2 0.8 - 1.2    Comment: (NOTE) INR goal varies based on device and disease states. Performed at [facility], [place] [address] [place], [place] [address]   CBC     Status: Abnormal   Collection Time: 01/06/20  1:44 AM  Result Value Ref Range   WBC 7.5 4.0 - 10.5 K/uL   RBC 2.61 (L) 3.87 - 5.11 MIL/uL   Hemoglobin 8.0 (L) 12.0 - 15.0 g/dL   HCT 24.0 (L) 36 - 46 %   MCV 92.0 80.0 - 100.0 fL   MCH 30.7 26.0 - 34.0 pg   MCHC 33.3 30.0 - 36.0 g/dL   RDW 14.1 11.5 - 15.5 %   Platelets 137 (L) 150 - 400 K/uL   nRBC 0.0 0.0 - 0.2 %    Comment: Performed at [facility], [place] [address], [place], [place] [address]  Basic metabolic panel     Status: Abnormal   Collection Time: 01/06/20  1:44 AM  Result Value  Ref Range   Sodium 132 (L) 135 - 145 mmol/L   Potassium 4.0 3.5 - 5.1 mmol/L   Chloride 99 98 - 111 mmol/L   CO2 24 22 - 32 mmol/L   Glucose, Bld 119 (H) 70 - 99 mg/dL    Comment: Glucose reference range applies only to samples taken after fasting for at least 8 hours.   BUN 20 8 - 23 mg/dL   Creatinine, Ser 0.91 0.44 - 1.00 mg/dL   Calcium 8.6 (L) 8.9 - 10.3 mg/dL   GFR, Estimated >60 >60 mL/min    Comment: (NOTE) Calculated using the CKD-EPI Creatinine Equation (2021)    Anion gap 9 5 - 15    Comment: Performed at [facility] [address], [place], [place] [address]   Pelvis Portable  Result Date: 01/04/2020 CLINICAL DATA:  Intramedullary nail placement for fracture EXAM: PORTABLE PELVIS 1-2 VIEWS COMPARISON:  Intraoperative radiographs right hip January 04, 2020; preoperative pelvis and right hip images January 04, 2020 FINDINGS: Frontal view obtained. There is screw and nail fixation through an intertrochanteric femur fracture on the right with alignment essentially anatomic at the fracture site. Screw tips in proximal right femur. Nail remains within intramedullary portion of bone throughout its course. No new fracture. No dislocation. Narrowing right hip joint is stable. Bony overgrowth along the superolateral aspect of the right acetabulum present. IMPRESSION: Screw and nail fixation through fracture of the intertrochanteric proximal right femur with alignment essentially anatomic in the area of fracture on frontal view. Screw tips in proximal femur. Narrowing right hip joint. Bony overgrowth along the superolateral aspect of the right acetabulum potentially places patient at increased risk for femoroacetabular impingement. Electronically Signed  By: Lowella Grip III M.D.   On: 01/04/2020 17:21   DG HIP UNILAT WITH PELVIS 2-3 VIEWS RIGHT  Result Date: 01/06/2020 CLINICAL DATA:  Onset pain after the patient felt a pop today. The patient suffered a right  intertrochanteric fracture in a fall 01/04/2020 and underwent internal fixation that same day. EXAM: DG HIP (WITH OR WITHOUT PELVIS) 2-3V RIGHT COMPARISON:  Plain films of the pelvis and right hip 01/04/2020. FINDINGS: There is no acute bony or joint abnormality. Fixation hardware for the patient's intertrochanteric fracture is intact and normal in appearance. Position and alignment of the fracture are unchanged. Attenuated appearance of the inferior pubic rami bilaterally is also unchanged. IMPRESSION: No acute abnormality. Status post fixation of a right intertrochanteric fracture. The appearance is unchanged. Electronically Signed   By: Inge Rise M.D.   On: 01/06/2020 14:35       Medical Problem List and Plan: 1.  Decreased functional ability secondary to right intertrochanteric hip fracture.  Status post open treatment intramedullary implant 01/04/2020.  Weightbearing as tolerated  -patient may shower with incision covered  -ELOS/Goals: 10-14 days/supervision/min a  Admit to CIR 2.  Antithrombotics: -DVT/anticoagulation: Lovenox.    Venous Dopplers ordered  -antiplatelet therapy: N/A 3. Pain Management: Hydrocodone and Robaxin as needed  Monitor with increased exertion 4. Mood: Zoloft 50 mg daily  -antipsychotic agents: N/A 5. Neuropsych: This patient is capable of making decisions on her own behalf. 6. Skin/Wound Care: Routine skin checks 7. Fluids/Electrolytes/Nutrition: Routine in and outs  CMP ordered for tomorrow. 8.  Acute blood loss anemia.    CBC ordered for tomorrow. 9.  Hypertension.  Toprolol 25 daily  Monitor with increased activity. 10.  Drug-induced constipation.  Colace 100 mg twice daily, MiraLAX daily  Increase bowel meds as necessary   Cathlyn Parsons, PA-C 01/06/2020  I have personally performed a face to face diagnostic evaluation, including, but not limited to relevant history and physical exam findings, of this patient and developed relevant  assessment and plan.  Additionally, I have reviewed and concur with the physician assistant's documentation above.  Delice Lesch, MD, ABPMR  The patient's status has not changed. Any changes from the pre-admission screening or documentation from the acute chart are noted above.   Delice Lesch, MD, ABPMR

## 2020-01-06 NOTE — TOC CAGE-AID Note (Signed)
Transition of Care [facility]) - CAGE-AID Screening   Patient Details  Name: Alison Rivera MRN: [id number] Date of Birth: 1932-01-10  Transition of Care [facility]) CM/SW Contact:    Emeterio Reeve, [place] Phone Number: 01/06/2020, 4:30 PM   Clinical Narrative:  CSW met with pt at bedside. CSW introduced self and explained role at the hospital.  Pt denies alcohol use. Pt denies substance use. Pt did not need any resources at this time.    CAGE-AID Screening:    Have You Ever Felt You Ought to Cut Down on Your Drinking or Drug Use?: No Have People Annoyed You By Critizing Your Drinking Or Drug Use?: No Have You Felt Bad Or Guilty About Your Drinking Or Drug Use?: No Have You Ever Had a Drink or Used Drugs First Thing In The Morning to Steady Your Nerves or to Get Rid of a Hangover?: No CAGE-AID Score: 0  Substance Abuse Education Offered: Yes    Blima Ledger, [place] Social Worker [phone]

## 2020-01-06 NOTE — Plan of Care (Signed)
°  Problem: Education: Goal: Knowledge of General Education information will improve Description: Including pain rating scale, medication(s)/side effects and non-pharmacologic comfort measures Outcome: Progressing   Problem: Clinical Measurements: Goal: Will remain free from infection Outcome: Progressing   Problem: Activity: Goal: Risk for activity intolerance will decrease Outcome: Progressing   Problem: Elimination: Goal: Will not experience complications related to bowel motility Outcome: Progressing   Problem: Pain Managment: Goal: General experience of comfort will improve Outcome: Progressing   Problem: Safety: Goal: Ability to remain free from injury will improve Outcome: Progressing

## 2020-01-06 NOTE — Progress Notes (Signed)
Report given to [facility] at [phone]. All questions were fully answered , still awaiting the results of DG hip d/t c/o "pop" while pt was on physical therapy. Attending MD aware of above. Will transfer pt to 4M08 once DG hip results ok.

## 2020-01-06 NOTE — Progress Notes (Addendum)
Inpatient Rehabilitation Admissions Coordinator  I met at bedside with patient and daughter. I Have Cir bed that I can admit patient to today. I have alerted Dr. Arrien, acute team and TOC. I await clearance from medical team to admit to CIR today.  Barbara Boyette, RN, MSN Rehab Admissions Coordinator [phone] 01/06/2020 11:10 AM   I have received approval from Dr. Arrien and will make the arrangements to admit to CIR today.  Barbara Boyette, RN, MSN Rehab Admissions Coordinator [phone] 01/06/2020 11:37 AM   

## 2020-01-06 NOTE — Progress Notes (Signed)
Inpatient Rehabilitation Medication Review by a Pharmacist  A complete drug regimen review was completed for this patient to identify any potential clinically significant medication issues.  Clinically significant medication issues were identified:  yes   Type of Medication Issue Identified Description of Issue Urgent (address now) Non-Urgent (address on AM team rounds) Plan Plan Accepted by Provider? (Yes / No / Pending AM Rounds)                                      Other  Checking to see if MVI, vit D, hydroxyzine, trazodone need to be ordered Non-urgent Messaged PA tonight     Name of provider notified for urgent issues identified: Silvestre Mesi, PA  Provider Method of Notification: Secure chat   For non-urgent medication issues to be resolved on team rounds tomorrow morning a [facility] Secure Chat Handoff was sent to:    Pharmacist comments:   Time spent performing this drug regimen review (minutes):  10   [place] 01/06/2020 4:50 PM

## 2020-01-06 NOTE — Progress Notes (Signed)
Jamse Arn, MD  Physician  Physical Medicine and Rehabilitation  PMR Pre-admission     Signed  Date of Service:  01/06/2020 12:03 PM      Related encounter: ED to Hosp-Admission (Current) from 01/04/2020 in [place]       Show:Clear all $RemoveBefore'[x]'qaneKzybxjXdl$ Manual$R'[x]'EJ$ Templa'[x]'$ Copied  Added by: $RemoveB'[x]'OIKXVagN$ Cristina Gong, RN$RemoveBeforeDE'[x]'lQIMiUlMPMIPUjK$ Jamse Arn, MD  $R'[]'jq$ Hover for details PMR Admission Coordinator Pre-Admission Assessment   Patient: Alison Rivera is an 86 y.o., female MRN: [id number] DOB: [birth year] Height: $RemoveBeforeDE'5\' 4"'EepPCZjSlqaqnlV$  (162.6 cm) Weight: 54.4 kg   Insurance Information HMO:     PPO:      PCP:      IPA:      80/20:      OTHER:  PRIMARY: Medicare a and b      Policy#: [id number]      Subscriber: pt Benefits:  Phone #: online     Name: 11/30 Eff. Date: 02/05/1998     Deduct: $1484      Out of Pocket Max: none      Life Max: none CIR: 100%      SNF: 20 full days Outpatient: 80%     Co-Pay: 20% Home Health: 100%      Co-Pay: none DME: 80%     Co-Pay: 20% Providers: pt choice  SECONDARY: [employer]      Policy#: [id number]   Financial Counselor:       Phone#:    The "Data Collection Information Summary" for patients in Inpatient Rehabilitation Facilities with attached "Privacy Act [place] Records" was provided and verbally reviewed with: Patient   Emergency Contact Information         Contact Information     Name Relation Home Work Mobile    ROYANNE, WARSHAW [phone]             Current Medical History  Patient Admitting Diagnosis: hip fracture   History of Present Illness: 86 year old right-handed female with history of mitral valve prolapse, osteoporosis, hypertension.   Presented 01/04/2020 after falling asleep on the toilet and falling landing on her right side.  Denied loss of consciousness.  Sustained right intertrochanteric hip fracture.  Admission chemistries unremarkable except calcium 7.1, hemoglobin  10.7.  Underwent open treatment of intertrochanteric fracture with intramedullary implant 01/04/2020 per Dr. Erlinda Hong.  Weightbearing as tolerated right lower extremity.  Lovenox for DVT prophylaxis.  Acute blood loss anemia 8.0 and monitored.     Patient's medical record from [facility] has been reviewed by the rehabilitation admission coordinator and physician.   Past Medical History      Past Medical History:  Diagnosis Date  . Chest pain, atypical    . Lichen planus    . Mitral valve prolapse    . Osteoporosis    . Squamous cell skin cancer        Family History   family history includes Congestive Heart Failure in her father; Other in her brother and mother; Parkinson's disease in her brother; Syncope episode in her father.   Prior Rehab/Hospitalizations Has the patient had prior rehab or hospitalizations prior to admission? Yes   Has the patient had major surgery during 100 days prior to admission? Yes              Current Medications   Current Facility-Administered Medications:  .  0.9 %  sodium chloride infusion, , Intravenous, Continuous, Erlinda Hong, Marylynn Pearson, MD,  Last Rate: 75 mL/hr at 01/04/20 1829, New Bag at 01/04/20 1829 .  acetaminophen (TYLENOL) tablet 650 mg, 650 mg, Oral, Q6H PRN, Regalado, Belkys A, MD, 650 mg at 01/05/20 2333 .  alum & mag hydroxide-simeth (MAALOX/MYLANTA) 200-200-20 MG/5ML suspension 30 mL, 30 mL, Oral, Q4H PRN, Leandrew Koyanagi, MD .  docusate sodium (COLACE) capsule 100 mg, 100 mg, Oral, BID, Leandrew Koyanagi, MD, 100 mg at 01/06/20 0901 .  enoxaparin (LOVENOX) injection 40 mg, 40 mg, Subcutaneous, Q24H, Leandrew Koyanagi, MD, 40 mg at 01/05/20 1646 .  HYDROcodone-acetaminophen (NORCO/VICODIN) 5-325 MG per tablet 1-2 tablet, 1-2 tablet, Oral, Q6H PRN, Leandrew Koyanagi, MD, 1 tablet at 01/06/20 0630 .  magnesium citrate solution 1 Bottle, 1 Bottle, Oral, Once PRN, Leandrew Koyanagi, MD .  menthol-cetylpyridinium (CEPACOL) lozenge 3 mg, 1 lozenge, Oral, PRN **OR**  phenol (CHLORASEPTIC) mouth spray 1 spray, 1 spray, Mouth/Throat, PRN, Leandrew Koyanagi, MD .  methocarbamol (ROBAXIN) tablet 500 mg, 500 mg, Oral, Q6H PRN, 500 mg at 01/05/20 2334 **OR** methocarbamol (ROBAXIN) 500 mg in dextrose 5 % 50 mL IVPB, 500 mg, Intravenous, Q6H PRN, Leandrew Koyanagi, MD .  metoprolol succinate (TOPROL-XL) 24 hr tablet 25 mg, 25 mg, Oral, Daily, Leandrew Koyanagi, MD, 25 mg at 01/06/20 0900 .  morphine 2 MG/ML injection 0.5 mg, 0.5 mg, Intravenous, Q2H PRN, Leandrew Koyanagi, MD, 0.5 mg at 01/04/20 1825 .  ondansetron (ZOFRAN) tablet 4 mg, 4 mg, Oral, Q6H PRN **OR** ondansetron (ZOFRAN) injection 4 mg, 4 mg, Intravenous, Q6H PRN, Leandrew Koyanagi, MD .  polyethylene glycol (MIRALAX / GLYCOLAX) packet 17 g, 17 g, Oral, Daily, Regalado, Belkys A, MD, 17 g at 01/06/20 0902 .  sertraline (ZOLOFT) tablet 50 mg, 50 mg, Oral, Daily, Leandrew Koyanagi, MD, 50 mg at 01/06/20 0901 .  sorbitol 70 % solution 30 mL, 30 mL, Oral, Daily PRN, Leandrew Koyanagi, MD, 30 mL at 01/06/20 0630 .  vitamin B-12 (CYANOCOBALAMIN) tablet 100 mcg, 100 mcg, Oral, Daily, Regalado, Belkys A, MD, 100 mcg at 01/06/20 0900   Patients Current Diet:     Diet Order                      Diet Heart Room service appropriate? Yes; Fluid consistency: Thin  Diet effective now                      Precautions / Restrictions Precautions Precautions: Fall Restrictions Weight Bearing Restrictions: Yes RLE Weight Bearing: Weight bearing as tolerated    Has the patient had 2 or more falls or a fall with injury in the past year? Yes   Prior Activity Level Community (5-7x/wk): Independent and driving   Prior Functional Level Self Care: Did the patient need help bathing, dressing, using the toilet or eating? Independent   Indoor Mobility: Did the patient need assistance with walking from room to room (with or without device)? Independent   Stairs: Did the patient need assistance with internal or external stairs (with or without  device)? Independent   Functional Cognition: Did the patient need help planning regular tasks such as shopping or remembering to take medications? Independent   Home Assistive Devices / Equipment Home Assistive Devices/Equipment: Eyeglasses Home Equipment: Shower seat - built in, Grab bars - tub/shower, Hand held shower head   Prior Device Use: Indicate devices/aids used by the patient prior to current illness, exacerbation or injury? None of the  above   Current Functional Level Cognition   Overall Cognitive Status: Within Functional Limits for tasks assessed Orientation Level: Oriented X4 General Comments: WFL for simple tasks, although internally distracted by pain requiring increased cues during mobility; not formally assessed    Extremity Assessment (includes Sensation/Coordination)   Upper Extremity Assessment: Generalized weakness  Lower Extremity Assessment: Defer to PT evaluation RLE Deficits / Details: s/p R hip nailing; able to wiggle toes, partial knee ext/flex, minimal ankle PF/DF and minimal hip flex - limited by post-op pain and weakness     ADLs   Overall ADL's : Needs assistance/impaired Eating/Feeding: Independent Grooming: Wash/dry hands, Wash/dry face, Set up, Sitting Upper Body Bathing: Supervision/ safety, Set up, Sitting Lower Body Bathing: Maximal assistance, Sit to/from stand Upper Body Dressing : Minimal assistance, Sitting Lower Body Dressing: Total assistance, Sit to/from stand Toilet Transfer: Maximal assistance, Stand-pivot Toileting- Clothing Manipulation and Hygiene: Total assistance, Sit to/from stand Functional mobility during ADLs: Maximal assistance, Rolling walker General ADL Comments: Pt. needs further ed on use of AE. Pt. is limted with LE ADLs secondary to pain.      Mobility   Overal bed mobility: Needs Assistance Bed Mobility: Supine to Sit Supine to sit: Mod assist, HOB elevated General bed mobility comments: ModA for RLE management,  scooting hips to EOB and assisting trunk elevation, cued to use bed rail to assist     Transfers   Overall transfer level: Needs assistance Equipment used: Rolling walker (2 wheeled) Transfers: Sit to/from Stand Sit to Stand: Max assist General transfer comment: Unable to stand on initial attempt, requiring return to sit and totalA to flex R knee in preparation to stand, cues for foot/hand placement and sequencing; able to stand on second trial with modA for trunk elevation and stability; maxA for eccentric control into sitting, limited by pain and weakness     Ambulation / Gait / Stairs / Wheelchair Mobility   Ambulation/Gait Ambulation/Gait assistance: Mod assist Gait Distance (Feet): 2 Feet Assistive device: Rolling walker (2 wheeled) Gait Pattern/deviations: Step-to pattern, Trunk flexed, Leaning posteriorly, Antalgic, Decreased dorsiflexion - right, Decreased weight shift to right General Gait Details: Significant increased time and effort initiating gait due to RLE post-op pain and weakness, requiring RW and modA to take a few steps to recliner; max cues for sequencing, unable to take complete step with RLE, reliant on sliding foot; difficulty accepting weight onto RLE in order to step with LLE Gait velocity: Decreased     Posture / Balance Balance Overall balance assessment: Needs assistance Sitting balance-Leahy Scale: Fair Standing balance-Leahy Scale: Poor Standing balance comment: Reliant on UE support and external assist     Special needs/care consideration Spouse with some cognitive deficits so daughters will be visiting along with spouse    Previous Home Environment  Living Arrangements: Spouse/significant other  Lives With: Spouse Available Help at Discharge:  (daughter, [name] , main caregiver) Type of Home: House Home Layout: Multi-level, Able to live on main level with bedroom/bathroom Home Access: Stairs to enter Entrance Stairs-Rails: None Entrance  Stairs-Number of Steps: 2 Bathroom Shower/Tub: Multimedia programmer: Standard Bathroom Accessibility: Yes How Accessible: Accessible via walker Aldora: No Additional Comments: Spouse with emotional lability and some cognitive deficits per [name], daughter   Discharge Living Setting Plans for Discharge Living Setting: Patient's home, Lives with (comment) (spouse) Type of Home at Discharge: House Discharge Home Layout: Multi-level, Able to live on main level with bedroom/bathroom Discharge Home Access: Stairs to enter Entrance  Stairs-Rails: None Entrance Stairs-Number of Steps: 2 Discharge Bathroom Shower/Tub: Garment/textile technologist: Standard Discharge Bathroom Accessibility: Yes How Accessible: Accessible via walker Does the patient have any problems obtaining your medications?: No   Social/Family/Support Systems Patient Roles: Spouse, Caregiver Contact Information: Natalia Leatherwood, daughter , main contact; spouse with some cognitive deficits Anticipated Caregiver: 2 daughters and spouse Anticipated Caregiver's Contact Information: Natalia Leatherwood [phone] Ability/Limitations of Caregiver: spouse with some ciognitive deficits Caregiver Availability: 24/7 Discharge Plan Discussed with Primary Caregiver: Yes Is Caregiver In Agreement with Plan?: Yes Does Caregiver/Family have Issues with Lodging/Transportation while Pt is in Rehab?: No   Goals Patient/Family Goal for Rehab: Mod I to supervision with PT and OT Expected length of stay: ELOS 10 to 14 days Pt/Family Agrees to Admission and willing to participate: Yes Program Orientation Provided & Reviewed with Pt/Caregiver Including Roles  & Responsibilities: Yes   Decrease burden of Care through IP rehab admission: n/a   Possible need for SNF placement upon discharge: not anticipated   Patient Condition: I have reviewed medical records from [facility], spoken with CM, and patient, spouse and  daughter. I met with patient at the bedside for inpatient rehabilitation assessment.  Patient will benefit from ongoing PT and OT, can actively participate in 3 hours of therapy a day 5 days of the week, and can make measurable gains during the admission.  Patient will also benefit from the coordinated team approach during an Inpatient Acute Rehabilitation admission.  The patient will receive intensive therapy as well as Rehabilitation physician, nursing, social worker, and care management interventions.  Due to bladder management, bowel management, safety, skin/wound care, disease management, medication administration, pain management and patient education the patient requires 24 hour a day rehabilitation nursing.  The patient is currently mod assist  with mobility and basic ADLs.  Discharge setting and therapy post discharge at home with home health is anticipated.  Patient has agreed to participate in the Acute Inpatient Rehabilitation Program and will admit today.   Preadmission Screen Completed By:  Clois Dupes, 01/06/2020 12:03 PM ______________________________________________________________________   Discussed status with Dr. Allena Katz  on  01/06/2020  at  1210 and received approval for admission today.   Admission Coordinator:  Clois Dupes, RN, time  1210 Date  01/06/2020    Assessment/Plan: Diagnosis: Right intertrochanteric hip fracture   1. Does the need for close, 24 hr/day Medical supervision in concert with the patient's rehab needs make it unreasonable for this patient to be served in a less intensive setting? Yes  2. Co-Morbidities requiring supervision/potential complications: mitral valve prolapse, osteoporosis, HTN (monitor and provide prns in accordance with increased physical exertion and pain), 3. Due to bowel management, safety, skin/wound care, disease management, pain management and patient education, does the patient require 24 hr/day rehab nursing?  Yes 4. Does the patient require coordinated care of a physician, rehab nurse, PT, OT to address physical and functional deficits in the context of the above medical diagnosis(es)? Yes Addressing deficits in the following areas: balance, endurance, locomotion, strength, transferring, bowel/bladder control, bathing, dressing, toileting and psychosocial support 5. Can the patient actively participate in an intensive therapy program of at least 3 hrs of therapy 5 days a week? Yes 6. The potential for patient to make measurable gains while on inpatient rehab is excellent 7. Anticipated functional outcomes upon discharge from inpatient rehab: supervision and min assist PT, supervision and min assist OT, n/a SLP 8. Estimated rehab length of stay to reach the  above functional goals is: 10-14 days. 9. Anticipated discharge destination: Home 10. Overall Rehab/Functional Prognosis: good     MD Signature: Delice Lesch, MD, ABPMR        Revision History                     Note Details  Author Jamse Arn, MD File Time 01/06/2020 12:19 PM  Author Type Physician Status Signed  Last Editor Jamse Arn, MD Service Physical Medicine and Rehabilitation

## 2020-01-06 NOTE — Progress Notes (Signed)
Pt transferred to 4M08 as ordered. Pt remains alert/oriented in no apparent distress. Denies any discomfort at this time.

## 2020-01-06 NOTE — Discharge Summary (Addendum)
Physician Discharge Summary  FAE BLOSSOM YYT:[id number] DOB: 1931/07/06 DOA: 01/04/2020  PCP: Alison Redwood, MD  Admit date: 01/04/2020 Discharge date: 01/06/2020  Admitted From: Home  Disposition:  CIR   Recommendations for Outpatient Follow-up and new medication changes:  1. Follow up with Dr. Brigitte Pulse in 14 days.  2. Follow up with Dr Erlinda Hong in 2 weeks for post op evaluation and staple removal.  3. Continue dvt prophylaxis with enoxaparin  4. Discontinue zolpidem and start a trial of trazodone for insomnia.   I spoke with patient's daughter at the bedside, we talked in detail about patient's condition, plan of care and prognosis and all questions were addressed.   Home Health: na   Equipment/Devices: na    Discharge Condition: stable  CODE STATUS: full  Diet recommendation: heart healthy   Brief/Interim Summary: Alison Rivera was admitted to the hospital with a working diagnosis of right intratrochanteric hip fracture.  86 year old female with past medical history of mitral valve prolapse, osteoporosis, depression and hypertension.  Patient sustained a fall after falling asleep while sitting on the bathroom commode.  No head trauma or loss of consciousness.  After the fall she had significant right hip pain that prompted her to come to the hospital.  On her initial physical examination her blood pressure was 149/73, heart rate 79, respiratory rate 18, temperature 97.8, oxygen saturation 94%.  Her lungs were clear to auscultation bilaterally, heart S1-S2, present rhythmic, soft abdomen, no lower extremity edema, the right lower extremity was shortened and externally rotated.  Sodium 138, potassium 4.0, chloride 111, bicarb 19, glucose 96, BUN 19, creatinine 0.73, AST 28, ALT 15, white count 7.8, hemoglobin 10.7, hematocrit 33.2, platelets 191. INR 2.5  SARS COVID-19 negative.  Chest radiograph with no infiltrates.  EKG 74 bpm, left axis deviation, left anterior fascicular block, first-degree  AV block, normal QTC, sinus rhythm, no ST segment or T wave changes.  Coagulopathy was corrected with vitamin K and 1 unit of FFP.  Patient was diagnosed with a right intratrochanteric hip fracture, she underwent open treatment with intramedullary implant (01/04/2020).   1.  Right intertrochanteric hip fracture.  Patient underwent procedure well, pain was well controlled, she received DVT prophylaxis, was evaluated by physical therapy and Occupational Therapy.  Currently patient will be transferred to inpatient rehab to continue her recovery.  2.  Coagulopathy due to Vitamin K deficiency.  Patient received vitamin K with improvement of INR down to 1.1, with a prothrombin time of 13.9. Continue nutritional supplements.  3.  Non-anion gap metabolic acidosis/hyponatremia..  Acid-base balance improved with supportive at discharge serum bicarb is 24.  Her discharge sodium is 132, with preserved kidney function, creatinine 0.91.  4.  Hypertension.  Continue metoprolol.  5.  Depression/ insomnia.  Continue sertraline. Hold on zolpidem, will start a trial of trazodone.   6.  Anemia due to acute blood loss, postoperative anemia.  Discharge hemoglobin is 8.0, hematocrit 24.0. Iron profile showed appropriate iron stores.   Discharge Diagnoses:  Principal Problem:   Closed fracture of femur, intertrochanteric, right, initial encounter [facility]) Active Problems:   Essential hypertension, benign   Anemia   Metabolic acidosis   Coagulation test abnormality    Discharge Instructions  Discharge Instructions    Weight bearing as tolerated   Complete by: As directed      Allergies as of 01/06/2020   No Known Allergies     Medication List    STOP taking these medications   zolpidem 10  MG tablet Commonly known as: AMBIEN     TAKE these medications   acetaminophen 325 MG tablet Commonly known as: TYLENOL Take 2 tablets (650 mg total) by mouth every 6 (six) hours as needed for moderate  pain or headache.   Centrum Silver tablet Take 1 tablet by mouth daily.   docusate sodium 100 MG capsule Commonly known as: COLACE Take 1 capsule (100 mg total) by mouth 2 (two) times daily.   enoxaparin 40 MG/0.4ML injection Commonly known as: LOVENOX Inject 0.4 mLs (40 mg total) into the skin daily for 14 days.   HM Vitamin D3 100 MCG (4000 UT) Caps Generic drug: Cholecalciferol Take 1 capsule by mouth daily.   HYDROcodone-acetaminophen 7.5-325 MG tablet Commonly known as: Norco Take 1-2 tablets by mouth 3 (three) times daily as needed for moderate pain.   hydrOXYzine 25 MG tablet Commonly known as: ATARAX/VISTARIL Take 25 mg by mouth every 8 (eight) hours as needed for itching.   metoprolol succinate 25 MG 24 hr tablet Commonly known as: TOPROL-XL Take 25 mg by mouth daily.   polyethylene glycol 17 g packet Commonly known as: MIRALAX / GLYCOLAX Take 17 g by mouth daily. Start taking on: January 07, 2020   sertraline 50 MG tablet Commonly known as: ZOLOFT Take 50 mg by mouth daily.   traZODone 50 MG tablet Commonly known as: DESYREL Take 1 tablet (50 mg total) by mouth at bedtime.            Discharge Care Instructions  (From admission, onward)         Start     Ordered   01/06/20 0000  Discharge wound care:       Comments: Right hip surgical wound, reinforce dressing until discontinued.   01/06/20 1227   01/04/20 0000  Weight bearing as tolerated        01/04/20 1611          Follow-up Information    Leandrew Koyanagi, MD In 2 weeks.   Specialty: Orthopedic Surgery Why: For suture removal, For wound re-check Contact information: [place] [address] [phone]              No Known Allergies  Consultations:  Orthopedics    Procedures/Studies: DG Chest 1 View  Result Date: 01/04/2020 CLINICAL DATA:  Fall EXAM: CHEST  1 VIEW COMPARISON:  02/26/2005 FINDINGS: The lungs are symmetrically hyperinflated in  keeping with changes of COPD. No superimposed confluent pulmonary infiltrate. No pneumothorax or pleural effusion. Cardiac size is at the upper limits of normal, stable since prior examination. Pulmonary vascularity is normal. No acute bone abnormality. IMPRESSION: No active disease.  COPD. Electronically Signed   By: Fidela Salisbury MD   On: 01/04/2020 04:02   Pelvis Portable  Result Date: 01/04/2020 CLINICAL DATA:  Intramedullary nail placement for fracture EXAM: PORTABLE PELVIS 1-2 VIEWS COMPARISON:  Intraoperative radiographs right hip January 04, 2020; preoperative pelvis and right hip images January 04, 2020 FINDINGS: Frontal view obtained. There is screw and nail fixation through an intertrochanteric femur fracture on the right with alignment essentially anatomic at the fracture site. Screw tips in proximal right femur. Nail remains within intramedullary portion of bone throughout its course. No new fracture. No dislocation. Narrowing right hip joint is stable. Bony overgrowth along the superolateral aspect of the right acetabulum present. IMPRESSION: Screw and nail fixation through fracture of the intertrochanteric proximal right femur with alignment essentially anatomic in the area of fracture on frontal  view. Screw tips in proximal femur. Narrowing right hip joint. Bony overgrowth along the superolateral aspect of the right acetabulum potentially places patient at increased risk for femoroacetabular impingement. Electronically Signed   By: Lowella Grip III M.D.   On: 01/04/2020 17:21   DG C-Arm 1-60 Min  Result Date: 01/04/2020 CLINICAL DATA:  Fixation right hip fracture. EXAM: OPERATIVE right HIP (WITH PELVIS IF PERFORMED) 2 VIEWS TECHNIQUE: Fluoroscopic spot image(s) were submitted for interpretation post-operatively. COMPARISON:  Earlier same day. FINDINGS: Examination demonstrates placement of right femoral intramedullary nail with 2 associated screws bridging the femoral neck into the  femoral head fixating patient's intertrochanteric fracture. Hardware is intact with anatomic alignment over the fracture site. IMPRESSION: Expected changes post fixation of right intertrochanteric hip fracture. Electronically Signed   By: Marin Olp M.D.   On: 01/04/2020 16:34   DG HIP OPERATIVE UNILAT W OR W/O PELVIS RIGHT  Result Date: 01/04/2020 CLINICAL DATA:  Fixation right hip fracture. EXAM: OPERATIVE right HIP (WITH PELVIS IF PERFORMED) 2 VIEWS TECHNIQUE: Fluoroscopic spot image(s) were submitted for interpretation post-operatively. COMPARISON:  Earlier same day. FINDINGS: Examination demonstrates placement of right femoral intramedullary nail with 2 associated screws bridging the femoral neck into the femoral head fixating patient's intertrochanteric fracture. Hardware is intact with anatomic alignment over the fracture site. IMPRESSION: Expected changes post fixation of right intertrochanteric hip fracture. Electronically Signed   By: Marin Olp M.D.   On: 01/04/2020 16:34   DG Hip Unilat With Pelvis 2-3 Views Right  Result Date: 01/04/2020 CLINICAL DATA:  Fall from toilet with right hip pain, initial encounter EXAM: DG HIP (WITH OR WITHOUT PELVIS) 3V RIGHT COMPARISON:  None. FINDINGS: There is an intratrochanteric fracture of the proximal right femur with impaction and angulation at the fracture site. Femoral head is well seated. Pelvic ring is intact. No soft tissue abnormality is noted. IMPRESSION: Intratrochanteric fracture of the right femur. Electronically Signed   By: Inez Catalina M.D.   On: 01/04/2020 04:02      Procedures: Open treatment of intertrochanteric fracture with intramedullary implant. CPT [phone]   Subjective: Patient is feeling better, her right hip pain is controlled, no nausea or vomiting, no dyspnea or chest pain.   Discharge Exam: Vitals:   01/06/20 0420 01/06/20 0856  BP: (!) 117/42 (!) 111/47  Pulse: 67 61  Resp: 16 14  Temp: 99.1 F (37.3 C) 98.8  F (37.1 C)  SpO2: 96% 95%   Vitals:   01/05/20 1636 01/05/20 1948 01/06/20 0420 01/06/20 0856  BP: (!) 115/52 (!) 116/46 (!) 117/42 (!) 111/47  Pulse: 70 66 67 61  Resp: 18 15 16 14   Temp: 97.7 F (36.5 C) 99.2 F (37.3 C) 99.1 F (37.3 C) 98.8 F (37.1 C)  TempSrc: Oral Oral Oral Oral  SpO2: 96% 97% 96% 95%  Weight:      Height:        General: Not in pain or dyspnea, deconditioned  Neurology: Awake and alert, non focal  E ENT: no pallor, no icterus, oral mucosa moist Cardiovascular: No JVD. S1-S2 present, rhythmic, no gallops, rubs, or murmurs. No lower extremity edema. Pulmonary: positive breath sounds bilaterally with no wheezing, rhonchi or rales. Gastrointestinal. Abdomen soft and non tender Skin. No rashes Musculoskeletal: no joint deformities   The results of significant diagnostics from this hospitalization (including imaging, microbiology, ancillary and laboratory) are listed below for reference.     Microbiology: Recent Results (from the past 240 hour(s))  Resp Panel  by RT-PCR (Flu A&B, Covid) Nasopharyngeal Swab     Status: None   Collection Time: 01/04/20  4:00 AM   Specimen: Nasopharyngeal Swab; Nasopharyngeal(NP) swabs in vial transport medium  Result Value Ref Range Status   SARS Coronavirus 2 by RT PCR NEGATIVE NEGATIVE Final    Comment: (NOTE) SARS-CoV-2 target nucleic acids are NOT DETECTED.  The SARS-CoV-2 RNA is generally detectable in upper respiratory specimens during the acute phase of infection. The lowest concentration of SARS-CoV-2 viral copies this assay can detect is 138 copies/mL. A negative result does not preclude SARS-Cov-2 infection and should not be used as the sole basis for treatment or other patient management decisions. A negative result may occur with  improper specimen collection/handling, submission of specimen other than nasopharyngeal swab, presence of viral mutation(s) within the areas targeted by this assay, and  inadequate number of viral copies(<138 copies/mL). A negative result must be combined with clinical observations, patient history, and epidemiological information. The expected result is Negative.  Fact Sheet for Patients:  EntrepreneurPulse.com.au  Fact Sheet for Healthcare Providers:  IncredibleEmployment.be  This test is no t yet approved or cleared by the [place] FDA and  has been authorized for detection and/or diagnosis of SARS-CoV-2 by FDA under an Emergency Use Authorization (EUA). This EUA will remain  in effect (meaning this test can be used) for the duration of the COVID-19 declaration under Section 564(b)(1) of the Act, 21 U.S.C.section 360bbb-3(b)(1), unless the authorization is terminated  or revoked sooner.       Influenza A by PCR NEGATIVE NEGATIVE Final   Influenza B by PCR NEGATIVE NEGATIVE Final    Comment: (NOTE) The Xpert Xpress SARS-CoV-2/FLU/RSV plus assay is intended as an aid in the diagnosis of influenza from Nasopharyngeal swab specimens and should not be used as a sole basis for treatment. Nasal washings and aspirates are unacceptable for Xpert Xpress SARS-CoV-2/FLU/RSV testing.  Fact Sheet for Patients: EntrepreneurPulse.com.au  Fact Sheet for Healthcare Providers: IncredibleEmployment.be  This test is not yet approved or cleared by the [place] FDA and has been authorized for detection and/or diagnosis of SARS-CoV-2 by FDA under an Emergency Use Authorization (EUA). This EUA will remain in effect (meaning this test can be used) for the duration of the COVID-19 declaration under Section 564(b)(1) of the Act, 21 U.S.C. section 360bbb-3(b)(1), unless the authorization is terminated or revoked.  Performed at [facility], [place] [address], [place], [place] [address]      Labs: BNP (last 3 results) No results for input(s): BNP in the last 8760  hours. Basic Metabolic Panel: Recent Labs  Lab 01/04/20 0248 01/04/20 1818 01/05/20 0452 01/06/20 0144  NA 138  --  132* 132*  K 4.0  --  4.1 4.0  CL 111  --  97* 99  CO2 19*  --  25 24  GLUCOSE 96  --  139* 119*  BUN 19  --  19 20  CREATININE 0.73 0.83 0.80 0.91  CALCIUM 7.1*  --  8.7* 8.6*   Liver Function Tests: Recent Labs  Lab 01/04/20 0248  AST 28  ALT 15  ALKPHOS 42  BILITOT 0.5  PROT 5.7*  ALBUMIN 2.9*   No results for input(s): LIPASE, AMYLASE in the last 168 hours. No results for input(s): AMMONIA in the last 168 hours. CBC: Recent Labs  Lab 01/04/20 0248 01/04/20 1818 01/05/20 0452 01/06/20 0144  WBC 7.8 10.5 6.9 7.5  NEUTROABS 5.8  --   --   --  HGB 10.7* 9.5* 8.7* 8.0*  HCT 33.2* 29.6* 26.4* 24.0*  MCV 96.5 94.0 94.0 92.0  PLT 191 161 137* 137*   Cardiac Enzymes: No results for input(s): CKTOTAL, CKMB, CKMBINDEX, TROPONINI in the last 168 hours. BNP: Invalid input(s): POCBNP CBG: No results for input(s): GLUCAP in the last 168 hours. D-Dimer No results for input(s): DDIMER in the last 72 hours. Hgb A1c No results for input(s): HGBA1C in the last 72 hours. Lipid Profile No results for input(s): CHOL, HDL, LDLCALC, TRIG, CHOLHDL, LDLDIRECT in the last 72 hours. Thyroid function studies No results for input(s): TSH, T4TOTAL, T3FREE, THYROIDAB in the last 72 hours.  Invalid input(s): FREET3 Anemia work up Recent Labs    01/04/20 0705  VITAMINB12 302  FOLATE 36.4  FERRITIN 39  TIBC 362  IRON 80  RETICCTPCT 1.0   Urinalysis No results found for: COLORURINE, APPEARANCEUR, LABSPEC, [place], GLUCOSEU, [place], [place], [place], PROTEINUR, UROBILINOGEN, NITRITE, LEUKOCYTESUR Sepsis Labs Invalid input(s): PROCALCITONIN,  WBC,  LACTICIDVEN Microbiology Recent Results (from the past 240 hour(s))  Resp Panel by RT-PCR (Flu A&B, Covid) Nasopharyngeal Swab     Status: None   Collection Time: 01/04/20  4:00 AM   Specimen: Nasopharyngeal  Swab; Nasopharyngeal(NP) swabs in vial transport medium  Result Value Ref Range Status   SARS Coronavirus 2 by RT PCR NEGATIVE NEGATIVE Final    Comment: (NOTE) SARS-CoV-2 target nucleic acids are NOT DETECTED.  The SARS-CoV-2 RNA is generally detectable in upper respiratory specimens during the acute phase of infection. The lowest concentration of SARS-CoV-2 viral copies this assay can detect is 138 copies/mL. A negative result does not preclude SARS-Cov-2 infection and should not be used as the sole basis for treatment or other patient management decisions. A negative result may occur with  improper specimen collection/handling, submission of specimen other than nasopharyngeal swab, presence of viral mutation(s) within the areas targeted by this assay, and inadequate number of viral copies(<138 copies/mL). A negative result must be combined with clinical observations, patient history, and epidemiological information. The expected result is Negative.  Fact Sheet for Patients:  EntrepreneurPulse.com.au  Fact Sheet for Healthcare Providers:  IncredibleEmployment.be  This test is no t yet approved or cleared by the [place] FDA and  has been authorized for detection and/or diagnosis of SARS-CoV-2 by FDA under an Emergency Use Authorization (EUA). This EUA will remain  in effect (meaning this test can be used) for the duration of the COVID-19 declaration under Section 564(b)(1) of the Act, 21 U.S.C.section 360bbb-3(b)(1), unless the authorization is terminated  or revoked sooner.       Influenza A by PCR NEGATIVE NEGATIVE Final   Influenza B by PCR NEGATIVE NEGATIVE Final    Comment: (NOTE) The Xpert Xpress SARS-CoV-2/FLU/RSV plus assay is intended as an aid in the diagnosis of influenza from Nasopharyngeal swab specimens and should not be used as a sole basis for treatment. Nasal washings and aspirates are unacceptable for Xpert Xpress  SARS-CoV-2/FLU/RSV testing.  Fact Sheet for Patients: EntrepreneurPulse.com.au  Fact Sheet for Healthcare Providers: IncredibleEmployment.be  This test is not yet approved or cleared by the [place] FDA and has been authorized for detection and/or diagnosis of SARS-CoV-2 by FDA under an Emergency Use Authorization (EUA). This EUA will remain in effect (meaning this test can be used) for the duration of the COVID-19 declaration under Section 564(b)(1) of the Act, 21 U.S.C. section 360bbb-3(b)(1), unless the authorization is terminated or revoked.  Performed at [facility], [place]  [address], [place], [place] [address]      Time coordinating discharge: 45 minutes  SIGNED:   Tawni Millers, MD  [facility] 01/06/2020, 12:02 PM

## 2020-01-06 NOTE — Progress Notes (Signed)
Patient arrived on unit, oriented to unit. Reviewed medications, therapy schedule, rehab routine and plan of care. States an understanding of information reviewed. No complications noted at this time. Patient reports no pain and is AX4 Alison Rivera L Bedie Dominey  

## 2020-01-07 ENCOUNTER — Inpatient Hospital Stay (HOSPITAL_COMMUNITY): Payer: Medicare Other | Admitting: Occupational Therapy

## 2020-01-07 ENCOUNTER — Inpatient Hospital Stay (HOSPITAL_COMMUNITY): Payer: Medicare Other

## 2020-01-07 ENCOUNTER — Inpatient Hospital Stay (HOSPITAL_BASED_OUTPATIENT_CLINIC_OR_DEPARTMENT_OTHER): Payer: Medicare Other

## 2020-01-07 DIAGNOSIS — E8809 Other disorders of plasma-protein metabolism, not elsewhere classified: Secondary | ICD-10-CM

## 2020-01-07 DIAGNOSIS — M7989 Other specified soft tissue disorders: Secondary | ICD-10-CM

## 2020-01-07 DIAGNOSIS — D62 Acute posthemorrhagic anemia: Secondary | ICD-10-CM

## 2020-01-07 DIAGNOSIS — R739 Hyperglycemia, unspecified: Secondary | ICD-10-CM

## 2020-01-07 DIAGNOSIS — Z9889 Other specified postprocedural states: Secondary | ICD-10-CM

## 2020-01-07 DIAGNOSIS — E46 Unspecified protein-calorie malnutrition: Secondary | ICD-10-CM

## 2020-01-07 DIAGNOSIS — G479 Sleep disorder, unspecified: Secondary | ICD-10-CM

## 2020-01-07 DIAGNOSIS — G8918 Other acute postprocedural pain: Secondary | ICD-10-CM

## 2020-01-07 DIAGNOSIS — S72144D Nondisplaced intertrochanteric fracture of right femur, subsequent encounter for closed fracture with routine healing: Secondary | ICD-10-CM

## 2020-01-07 DIAGNOSIS — I10 Essential (primary) hypertension: Secondary | ICD-10-CM

## 2020-01-07 LAB — CBC WITH DIFFERENTIAL/PLATELET
Abs Immature Granulocytes: 0.04 10*3/uL (ref 0.00–0.07)
Basophils Absolute: 0 10*3/uL (ref 0.0–0.1)
Basophils Relative: 0 %
Eosinophils Absolute: 0 10*3/uL (ref 0.0–0.5)
Eosinophils Relative: 0 %
HCT: 22.4 % — ABNORMAL LOW (ref 36.0–46.0)
Hemoglobin: 7.6 g/dL — ABNORMAL LOW (ref 12.0–15.0)
Immature Granulocytes: 1 %
Lymphocytes Relative: 15 %
Lymphs Abs: 1 10*3/uL (ref 0.7–4.0)
MCH: 31.3 pg (ref 26.0–34.0)
MCHC: 33.9 g/dL (ref 30.0–36.0)
MCV: 92.2 fL (ref 80.0–100.0)
Monocytes Absolute: 0.7 10*3/uL (ref 0.1–1.0)
Monocytes Relative: 11 %
Neutro Abs: 4.9 10*3/uL (ref 1.7–7.7)
Neutrophils Relative %: 73 %
Platelets: 159 10*3/uL (ref 150–400)
RBC: 2.43 MIL/uL — ABNORMAL LOW (ref 3.87–5.11)
RDW: 13.9 % (ref 11.5–15.5)
WBC: 6.7 10*3/uL (ref 4.0–10.5)
nRBC: 0 % (ref 0.0–0.2)

## 2020-01-07 LAB — COMPREHENSIVE METABOLIC PANEL
ALT: 14 U/L (ref 0–44)
AST: 41 U/L (ref 15–41)
Albumin: 2.5 g/dL — ABNORMAL LOW (ref 3.5–5.0)
Alkaline Phosphatase: 42 U/L (ref 38–126)
Anion gap: 9 (ref 5–15)
BUN: 14 mg/dL (ref 8–23)
CO2: 29 mmol/L (ref 22–32)
Calcium: 8.8 mg/dL — ABNORMAL LOW (ref 8.9–10.3)
Chloride: 99 mmol/L (ref 98–111)
Creatinine, Ser: 0.73 mg/dL (ref 0.44–1.00)
GFR, Estimated: >60 mL/min (ref >60)
Glucose, Bld: 109 mg/dL — ABNORMAL HIGH (ref 70–99)
Potassium: 3.9 mmol/L (ref 3.5–5.1)
Sodium: 137 mmol/L (ref 135–145)
Total Bilirubin: 0.8 mg/dL (ref 0.3–1.2)
Total Protein: 5.7 g/dL — ABNORMAL LOW (ref 6.5–8.1)

## 2020-01-07 LAB — PROTIME-INR
INR: 1.2 (ref 0.8–1.2)
Prothrombin Time: 14.6 seconds (ref 11.4–15.2)

## 2020-01-07 MED ORDER — PROSOURCE PLUS PO LIQD
30.0000 mL | Freq: Two times a day (BID) | ORAL | Status: DC
  Administered 2020-01-07 – 2020-01-20 (×23): 30 mL via ORAL
  Filled 2020-01-07 (×22): qty 30

## 2020-01-07 MED ORDER — VITAMIN D 25 MCG (1000 UNIT) PO TABS
1000.0000 [IU] | ORAL_TABLET | Freq: Every day | ORAL | Status: DC
  Administered 2020-01-07 – 2020-01-21 (×15): 1000 [IU] via ORAL
  Filled 2020-01-07 (×15): qty 1

## 2020-01-07 MED ORDER — ADULT MULTIVITAMIN W/MINERALS CH
1.0000 | ORAL_TABLET | Freq: Every day | ORAL | Status: DC
  Administered 2020-01-07 – 2020-01-21 (×15): 1 via ORAL
  Filled 2020-01-07 (×15): qty 1

## 2020-01-07 NOTE — Evaluation (Signed)
Occupational Therapy Assessment and Plan  Patient Details  Name: Alison Rivera MRN: [id number] Date of Birth: Feb 27, 1931  OT Diagnosis: acute pain and muscle weakness (generalized) Rehab Potential: Rehab Potential (ACUTE ONLY): Good ELOS: 12-14 days   Today's Date: 01/07/2020 OT Individual Time: 0930-1030 OT Individual Time Calculation (min): 60 min     Hospital Problem: Principal Problem:   Intertrochanteric fracture of right hip ([place]) Active Problems:   Hypoalbuminemia due to protein-calorie malnutrition ([place])   Hyperglycemia   Sleep disturbance   Past Medical History:  Past Medical History:  Diagnosis Date  . Chest pain, atypical   . Lichen planus   . Mitral valve prolapse   . Osteoporosis   . Squamous cell skin cancer    Past Surgical History:  Past Surgical History:  Procedure Laterality Date  . im nail right hip Right 01/04/2020  . INTRAMEDULLARY (IM) NAIL INTERTROCHANTERIC Right 01/04/2020   Procedure: INTRAMEDULLARY (IM) NAIL INTERTROCHANTRIC;  Surgeon: Leandrew Koyanagi, MD;  Location: [facility];  Service: Orthopedics;  Laterality: Right;  . NOSE SURGERY      Assessment & Plan Clinical Impression: Alison Rivera is an 86 year old right-handed female with history of mitral valve prolapse, osteoporosis, hypertension. History taken from chart review, daughter, and patient. Patient lives with spouse. Two-level home bed and bath main level 2 steps to entry. Independent prior to admission. Husband can assist as well as daughter who lives nearby. She presented on 11/39/21 after falling asleep on the toilet and falling landing on her right side. Denies LOC. Sustained right intertrochanteric hip fracture. Admission chemistries unremarkable except calcium 7.1, hemoglobin 10.7. Underwent open treatment of intertrochanteric fracture with intramedullary implant on 01/04/2020 per Dr. Erlinda Hong. Weightbearing as tolerated right lower extremity. Lovenox for DVT prophylaxis. Hospital course further  complicated by acute blood loss anemia, 8.0 and monitored. Patient noted a "pop" on day of admission with therapies, discussed with attending MD and stat hip x-ray ordered. Personally reviewed, postsurgical changes. Therapy evaluations completed the patient was admitted for a comprehensive rehab program.   Patient transferred to CIR on 01/06/2020 .    Patient currently requires max with basic self-care skills secondary to muscle weakness and muscle joint tightness, decreased cardiorespiratoy endurance and decreased standing balance and decreased balance strategies.  Prior to hospitalization, patient was fully independent,  Patient will benefit from skilled intervention to increase independence with basic self-care skills and increase level of independence with iADL prior to discharge home with care partner.  Anticipate patient will require intermittent supervision and follow up home health.  OT - End of Session Activity Tolerance: Tolerates 10 - 20 min activity with multiple rests Endurance Deficit: Yes Endurance Deficit Description: pt SOB with minimal activity OT Assessment Rehab Potential (ACUTE ONLY): Good OT Patient demonstrates impairments in the following area(s): Balance;Endurance;Motor;Pain OT Basic ADL's Functional Problem(s): Grooming;Bathing;Dressing;Toileting OT Advanced ADL's Functional Problem(s): Simple Meal Preparation OT Transfers Functional Problem(s): Toilet;Tub/Shower OT Additional Impairment(s): None OT Plan OT Intensity: Minimum of 1-2 x/day, 45 to 90 minutes OT Frequency: 5 out of 7 days OT Duration/Estimated Length of Stay: 12-14 days OT Treatment/Interventions: Balance/vestibular training;Discharge planning;Functional mobility training;DME/adaptive equipment instruction;Pain management;Patient/family education;Psychosocial support;Therapeutic Activities;Self Care/advanced ADL retraining;Therapeutic Exercise;UE/LE Strength taining/ROM OT Self Feeding Anticipated  Outcome(s): independent OT Basic Self-Care Anticipated Outcome(s): Mod I dressing, S bathing OT Toileting Anticipated Outcome(s): Mod I OT Bathroom Transfers Anticipated Outcome(s): Mod I to toilet, S to shower OT Recommendation Patient destination: Home Follow Up Recommendations: Home health OT Equipment Recommended: To be determined  OT Evaluation Precautions/Restrictions  Precautions Precautions: Fall Restrictions Weight Bearing Restrictions: Yes RLE Weight Bearing: Weight bearing as tolerated  Pain Pain Assessment Pain Scale: 0-10 Pain Score: 3  - R hip at rest - premedicated  Home Living/Prior Functioning Home Living Living Arrangements: Spouse/significant other Available Help at Discharge: Family, Available 24 hours/day (daughter would be available to assist) Type of Home: House Home Access: Stairs to enter [employer] of Steps: 2 Entrance Stairs-Rails: None Home Layout: Multi-level, Able to live on main level with bedroom/bathroom Alternate Level Stairs-Number of Steps: flight Alternate Level Stairs-Rails: Left Bathroom Shower/Tub: Nurse, mental health Accessibility: Yes  Lives With: Spouse Prior Function Level of Independence: Independent with basic ADLs, Independent with gait, Independent with homemaking with ambulation, Independent with transfers  Able to Take Stairs?: Yes Driving: Yes Vocation: Other (Comment) 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 Vision Baseline Vision/History: Wears glasses Wears Glasses: Reading only Patient Visual Report: No change from baseline Vision Assessment?: No apparent visual deficits Perception  Perception: Within Functional Limits Praxis Praxis: Intact Cognition Overall Cognitive Status: Within Functional Limits for tasks assessed Arousal/Alertness: Awake/alert Orientation Level: Person;Place;Situation Person: Oriented Place: Oriented Situation:  Oriented Year: 2021 Month: December Day of Week: Correct Memory: Appears intact Immediate Memory Recall: Sock;Blue;Bed Memory Recall Sock: Without Cue Memory Recall Blue: Without Cue Memory Recall Bed: Without Cue Awareness: Appears intact Problem Solving: Impaired Safety/Judgment: Appears intact Comments: slow processing time, pt distracted by pain Sensation Sensation Light Touch: Appears Intact Hot/Cold: Appears Intact Proprioception: Appears Intact Stereognosis: Appears Intact Coordination Gross Motor Movements are Fluid and Coordinated: No Fine Motor Movements are Fluid and Coordinated: Yes Coordination and Movement Description: grossly uncoordinated due to pain, generalized weakness, decreased balance stragegies, and poor activity tolerance Finger Nose Finger Test: [facility] bilaterally Heel Shin Test: decreased ROM on LLE unable to actively lift RLE due to weakness Motor  Motor Motor: Abnormal postural alignment and control Motor - Skilled Clinical Observations: grossly uncoordinated due to pain, generalized weakness, decreased balance stragegies, and poor activity tolerance  Trunk/Postural Assessment  Cervical Assessment Cervical Assessment: Exceptions to [facility] (forward head) Thoracic Assessment Thoracic Assessment: Exceptions to [facility] (rounded shoulders) Lumbar Assessment Lumbar Assessment: Exceptions to [facility] (posterior pelvic tilt) Postural Control Postural Control: Deficits on evaluation  Balance Balance Balance Assessed: Yes Static Sitting Balance Static Sitting - Balance Support: Feet supported;Bilateral upper extremity supported Static Sitting - Level of Assistance: 5: Stand by assistance (supervision) Dynamic Sitting Balance Dynamic Sitting - Balance Support: Feet supported;Bilateral upper extremity supported Dynamic Sitting - Level of Assistance: 5: Stand by assistance (supervision) Static Standing Balance Static Standing - Balance Support: Bilateral upper  extremity supported (RW) Static Standing - Level of Assistance: 4: Min assist Dynamic Standing Balance Dynamic Standing - Balance Support: Bilateral upper extremity supported (RW) Dynamic Standing - Level of Assistance: 3: Mod assist Extremity/Trunk Assessment RUE Assessment RUE Assessment: Within Functional Limits LUE Assessment LUE Assessment: Within Functional Limits  Care Tool Care Tool Self Care Eating    set up    Oral Care    Oral Care Assist Level: Set up assist    Bathing   Body parts bathed by patient: Right arm;Left arm;Chest;Abdomen;Front perineal area;Right upper leg;Left upper leg;Face Body parts bathed by helper: Buttocks;Right lower leg;Left lower leg   Assist Level: Moderate Assistance - Patient 50 - 74%    Upper Body Dressing(including orthotics)   What is the patient wearing?: Pull over shirt   Assist Level: Minimal Assistance -  Patient > 75%    Lower Body Dressing (excluding footwear)   What is the patient wearing?: Underwear/pull up;Pants Assist for lower body dressing: Maximal Assistance - Patient 25 - 49%    Putting on/Taking off footwear   What is the patient wearing?: Ted hose;Non-skid slipper socks Assist for footwear: Dependent - Patient 0%       Care Tool Toileting Toileting activity   Assist for toileting: Maximal Assistance - Patient 25 - 49%     Care Tool Bed Mobility Roll left and right activity   Roll left and right assist level: Supervision/Verbal cueing (bedrails)    Sit to lying activity        Lying to sitting edge of bed activity   Lying to sitting edge of bed assist level: Maximal Assistance - Patient 25 - 49%     Care Tool Transfers Sit to stand transfer   Sit to stand assist level: Moderate Assistance - Patient 50 - 74%    Chair/bed transfer   Chair/bed transfer assist level: Moderate Assistance - Patient 50 - 74%     Toilet transfer   Assist Level: Moderate Assistance - Patient 50 - 74%     Care Tool  Cognition Expression of Ideas and Wants Expression of Ideas and Wants: Without difficulty (complex and basic) - expresses complex messages without difficulty and with speech that is clear and easy to understand   Understanding Verbal and Non-Verbal Content Understanding Verbal and Non-Verbal Content: Usually understands - understands most conversations, but misses some part/intent of message. Requires cues at times to understand   Memory/Recall Ability *first 3 days only Memory/Recall Ability *first 3 days only: Current season;Location of own room;Staff names and faces;That he or she is in a hospital/hospital unit    Refer to Care Plan for [place] [facility] OT Short Term Goal 1 (Week 1): Pt will complete toilet transfer with CGA. OT Short Term Goal 2 (Week 1): Pt will complete toileting with CGA. OT Short Term Goal 3 (Week 1): Pt will complete LB dressing with min A.  Recommendations for other services: None    Skilled Therapeutic Intervention ADL ADL Eating: Independent Grooming: Setup Upper Body Bathing: Setup Lower Body Bathing: Moderate assistance Upper Body Dressing: Minimal assistance Lower Body Dressing: Maximal assistance Toileting: Maximal assistance Toilet Transfer: Moderate assistance Toilet Transfer Method: Stand pivot Mobility  Bed Mobility Bed Mobility: Rolling Right;Rolling Left;Supine to Sit Rolling Right: Supervision/verbal cueing Rolling Left: Supervision/Verbal cueing Supine to Sit: Maximal Assistance - Patient - Patient 25-49% Transfers Sit to Stand: Moderate Assistance - Patient 50-74% Stand to Sit: Minimal Assistance - Patient > 75%   Pt seen this session for OT evaluation and ADL training with a focus on sit to stand skills and standing balance.  Pt had increased pain with standing and really could not tolerate much weight bearing on her R LE.  Worked on standing and trying to push through arms to unweight her R leg in order to  advance her L leg during transfer.  Pt will need practice with this.  Reviewed OT POC, discussed pt's goals, ELOS.  Her daughter will be available to help but pt really needs to be able to reach a mod I level. Pt resting in wc with belt alarm on and all needs met.     Discharge Criteria: Patient will be discharged from OT if patient refuses treatment 3 consecutive times without medical reason, if treatment goals not met, if there  is a change in medical status, if patient makes no progress towards goals or if patient is discharged from hospital.  The above assessment, treatment plan, treatment alternatives and goals were discussed and mutually agreed upon: by patient  [facility] 01/07/2020, 12:25 PM

## 2020-01-07 NOTE — Progress Notes (Addendum)
[facility] PHYSICAL MEDICINE & REHABILITATION PROGRESS NOTE  Subjective/Complaints: Patient seen sitting up in bed this morning.  She states she did not sleep well overnight as result of being in the hospital.  She is ready begin therapies however.  ROS: Denies CP, SOB, N/V/D  Objective: Vital Signs: Blood pressure (!) 120/56, pulse 74, temperature 98.5 F (36.9 C), resp. rate 17, height 5\' 4"  (1.626 m), weight 57.4 kg, SpO2 93 %. DG HIP UNILAT WITH PELVIS 2-3 VIEWS RIGHT  Result Date: 01/06/2020 CLINICAL DATA:  Onset pain after the patient felt a pop today. The patient suffered a right intertrochanteric fracture in a fall 01/04/2020 and underwent internal fixation that same day. EXAM: DG HIP (WITH OR WITHOUT PELVIS) 2-3V RIGHT COMPARISON:  Plain films of the pelvis and right hip 01/04/2020. FINDINGS: There is no acute bony or joint abnormality. Fixation hardware for the patient's intertrochanteric fracture is intact and normal in appearance. Position and alignment of the fracture are unchanged. Attenuated appearance of the inferior pubic rami bilaterally is also unchanged. IMPRESSION: No acute abnormality. Status post fixation of a right intertrochanteric fracture. The appearance is unchanged. Electronically Signed   By: Inge Rise M.D.   On: 01/06/2020 14:35   Recent Labs    01/06/20 0144 01/07/20 0537  WBC 7.5 6.7  HGB 8.0* 7.6*  HCT 24.0* 22.4*  PLT 137* 159   Recent Labs    01/06/20 0144 01/07/20 0537  NA 132* 137  K 4.0 3.9  CL 99 99  CO2 24 29  GLUCOSE 119* 109*  BUN 20 14  CREATININE 0.91 0.73  CALCIUM 8.6* 8.8*    Intake/Output Summary (Last 24 hours) at 01/07/2020 0815 Last data filed at 01/06/2020 2217 Gross per 24 hour  Intake 320 ml  Output --  Net 320 ml        Physical Exam: BP (!) 120/56 (BP Location: Left Arm)   Pulse 74   Temp 98.5 F (36.9 C)   Resp 17   Ht 5\' 4"  (1.626 m)   Wt 57.4 kg   SpO2 93%   BMI 21.72 kg/m  Constitutional: No  distress . Vital signs reviewed. HENT: Normocephalic.  Atraumatic. Eyes: EOMI. No discharge. Cardiovascular: No JVD.  RRR. Respiratory: Normal effort.  No stridor.  Bilateral clear to auscultation. GI: Non-distended.  BS +. Skin: Warm and dry.  Right hip with dressings CDI Psych: Normal mood.  Normal behavior. Musc: Right hip with edema and mild tenderness Neuro: Alert Motor:  Right lower extremity: Hip flexion, knee extension 1+/5 (pain inhibition), ankle dorsiflexion 4+/5, unchanged Left lower extremity: Hip flexion, knee extension 4-4+/5 (pain inhibition), ankle dorsiflexion 4+/5  Assessment/Plan: 1. Functional deficits which require 3+ hours per day of interdisciplinary therapy in a comprehensive inpatient rehab setting.  Physiatrist is providing close team supervision and 24 hour management of active medical problems listed below.  Physiatrist and rehab team continue to assess barriers to discharge/monitor patient progress toward functional and medical goals   Care Tool:  Bathing              Bathing assist       Upper Body Dressing/Undressing Upper body dressing        Upper body assist      Lower Body Dressing/Undressing Lower body dressing            Lower body assist       Toileting Toileting    Toileting assist       Transfers Chair/bed  transfer  Transfers assist           Locomotion Ambulation   Ambulation assist              Walk 10 feet activity   Assist           Walk 50 feet activity   Assist           Walk 150 feet activity   Assist           Walk 10 feet on uneven surface  activity   Assist           Wheelchair     Assist               Wheelchair 50 feet with 2 turns activity    Assist            Wheelchair 150 feet activity     Assist           Medical Problem List and Plan: 1.  Decreased functional ability secondary to right intertrochanteric hip  fracture.  Status post open treatment intramedullary implant 01/04/2020.  Weightbearing as tolerated  Begin CIR evaluations 2.  Antithrombotics: -DVT/anticoagulation: Lovenox.               Venous Dopplers ordered             -antiplatelet therapy: N/A 3. Pain Management: Hydrocodone and Robaxin as needed             Monitor with increased exertion 4. Mood: Zoloft 50 mg daily             -antipsychotic agents: N/A 5. Neuropsych: This patient is capable of making decisions on her own behalf. 6. Skin/Wound Care: Routine skin checks 7. Fluids/Electrolytes/Nutrition: Routine in and outs 8.  Acute blood loss anemia.               Hemoglobin 7.6 on 12/2, labs ordered for tomorrow  Will consider transfusion if necessary 9.  Hypertension.  Toprolol 25 daily             Monitor with increased activity 10.  Drug-induced constipation.  Colace 100 mg twice daily, MiraLAX daily             Increase bowel meds as necessary 11.  Hyperglycemia-?  Stress-induced  Will order hemoglobin A1c with next set of labs 12.  Hypoalbuminemia  Supplement initiated on 12/2 13.  Sleep disturbance  Will consider increase/change to trazodone 25 after therapies if patient does not sleep well tonight  LOS: 1 days A FACE TO FACE EVALUATION WAS PERFORMED  Ferris Fielden Lorie Phenix 01/07/2020, 8:15 AM

## 2020-01-07 NOTE — Progress Notes (Signed)
Physical Therapy Session Note  Patient Details  Name: Alison Rivera MRN: [id number] Date of Birth: Sep 02, 1931  Today's Date: 01/07/2020 PT Individual Time: 1300-1329 PT Individual Time Calculation (min): 29 min   Short Term Goals: Week 1:  PT Short Term Goal 1 (Week 1): pt will perform bed mobility with min A overall PT Short Term Goal 2 (Week 1): Pt will transfer bed<>WC with LRAD and CGA PT Short Term Goal 3 (Week 1): Pt will ambulate 18ft with LRAD and CGA  Skilled Therapeutic Interventions/Progress Updates:  Pt greeted supine in bed, daughter at bedside, pt agreeable to PT session. Donned pants with maxA while supine, assist for threading and pulling over hips. Able to roll towards her L side with supervision with HOB elevated and use of bed rail. Performed supine<>sit with HOB elevated with minA for RLE management. Completed stand<>pivot transfer with min/modA and no AD from EOB to w/c. Transported in w/c to ortho gym and performed additional stand<>pivot with min/modA from w/c to mat table. Completed RLE LAQ and hip marches (AAROM) while seated EOM. Then completed pre-gait training with RW including lateral weight shifts, forward/backward stepping with RLE, and side stepping 55ft L<>R along mat table. Cues for increasing RLE weight bearing, RW management, and general sequencing throughout. She demonstrated improved ability to accept weight through RLE with functional transfers after completing pre-gait training. Stand<>pivot with min/modA back to w/c and returned to her room where she remained seated in w/c with family present, safety belt alarm on, needs in reach. Pt made comfortable.  Therapy Documentation Precautions:  Precautions Precautions: Fall Restrictions Weight Bearing Restrictions: Yes RLE Weight Bearing: Weight bearing as tolerated  Therapy/Group: Individual Therapy  Choua Chalker P Katerin Negrete PT 01/07/2020, 1:30 PM

## 2020-01-07 NOTE — Plan of Care (Signed)
  Problem: Consults Goal: RH GENERAL PATIENT EDUCATION Description: See Patient Education module for education specifics. Outcome: Progressing   Problem: RH BOWEL ELIMINATION Goal: RH STG MANAGE BOWEL WITH ASSISTANCE Description: STG Manage Bowel with min Assistance. Outcome: Progressing   Problem: RH BLADDER ELIMINATION Goal: RH STG MANAGE BLADDER WITH ASSISTANCE Description: STG Manage Bladder With Min Assistance Outcome: Progressing   Problem: RH SKIN INTEGRITY Goal: RH STG ABLE TO PERFORM INCISION/WOUND CARE W/ASSISTANCE Description: STG Able To Perform Incision/Wound Care With [employer]. Outcome: Progressing   Problem: RH PAIN MANAGEMENT Goal: RH STG PAIN MANAGED AT OR BELOW PT'S PAIN GOAL Description: <4 out of 10.  Outcome: Progressing   Problem: RH KNOWLEDGE DEFICIT GENERAL Goal: RH STG INCREASE KNOWLEDGE OF SELF CARE AFTER HOSPITALIZATION Description: Pt and family will verbalize understanding of self care including medication management, incision care, pain management, and follow up care at the time of discharge with min cues using provided educational materials.  Outcome: Progressing

## 2020-01-07 NOTE — Progress Notes (Signed)
Patient Details  Name: Alison Rivera MRN: [id number] Date of Birth: [birth year]  Today's Date: 01/07/2020  Hospital Problems: Principal Problem:   Intertrochanteric fracture of right hip ([place]) Active Problems:   Hypoalbuminemia due to protein-calorie malnutrition ([place])   Hyperglycemia   Sleep disturbance  Past Medical History:  Past Medical History:  Diagnosis Date  . Chest pain, atypical   . Lichen planus   . Mitral valve prolapse   . Osteoporosis   . Squamous cell skin cancer    Past Surgical History:  Past Surgical History:  Procedure Laterality Date  . im nail right hip Right 01/04/2020  . INTRAMEDULLARY (IM) NAIL INTERTROCHANTERIC Right 01/04/2020   Procedure: INTRAMEDULLARY (IM) NAIL INTERTROCHANTRIC;  Surgeon: Leandrew Koyanagi, MD;  Location: [facility];  Service: Orthopedics;  Laterality: Right;  . NOSE SURGERY     Social History:  reports that she has never smoked. She has never used smokeless tobacco. She reports current alcohol use of about 5.0 standard drinks of alcohol per week. She reports that she does not use drugs.  Family / Support Systems Marital Status: Married Patient Roles: Spouse, Parent Spouse/Significant Other: Cletus Gash [phone]-home Children: Katherine-daughter [phone]-cell  Robbie Lis Other Supports: Extended family Anticipated Caregiver: Daughter's and husband Ability/Limitations of Caregiver: Husband has cognitive deficits but is in denial and manages their bills and still drives Caregiver Availability: 24/7 Family Dynamics: Close knit family who depend upon one another. Pt and husband manage on their own and have been doing well until this fall. Pt feels husband can assist if needed  Social History Preferred language: English Religion: [employer] Cultural Background: No issues Education: HS Read: Yes Write: Yes Employment Status: Retired Public relations account executive Issues: No issues Guardian/Conservator: None-according to MD pt is capable  of making her own decisions while here. katherine will be involved while here   Abuse/Neglect Abuse/Neglect Assessment Can Be Completed: Yes Physical Abuse: Denies Verbal Abuse: Denies Sexual Abuse: Denies Exploitation of patient/patient's resources: Denies Self-Neglect: Denies  Emotional Status Pt's affect, behavior and adjustment status: Pt is motivated to do well but is having much pain with her hip. She asked when this will be better, told her to talk with MD. She has always been independent and not been in a hospital. Recent Psychosocial Issues: other health issues-osteoarthritis Psychiatric History: No issues-deferred depression screen due to coping appropriately and able to verbalize her concerns and feelings Substance Abuse History: No issues  Patient / Family Perceptions, Expectations & Goals Pt/Family understanding of illness & functional limitations: Pt and daughter can explain her hip fracture and limitations as a result of this. She hopes to get her pain under better control she feels it is limiting her. She does talk with the MD and feel has a good understanding of her plan Premorbid pt/family roles/activities: Wife, MOm, grandmother, great grandmother, retiree, etc Anticipated changes in roles/activities/participation: resume Pt/family expectations/goals: Pt states: " I hope I will be able to move around on my own and not need assist."  Daughter states: " We will assist but she is fairly independent and doesn't want [place] to help."  [employer]: None Premorbid Home Care/DME Agencies: None Transportation available at discharge: Husband and daughter  Discharge Planning Living Arrangements: Spouse/significant other Support Systems: Spouse/significant other, Children, Other relatives, Friends/neighbors Type of Residence: Private residence Insurance underwriter Resources: [employer], Multimedia programmer (specify) Sports administrator) Financial Resources: [place] Referred: No Living Expenses: Own Money Management: Spouse Does the patient have any problems obtaining your medications?:  No Home Management: Patient Patient/Family Preliminary Plans: Return home with husband and daughter who lives close by to assist. She hopes to be mobile on her own but will wait and see. Husband does have some memory issues but still is functional and staying alone while pt is here Care Coordinator Anticipated Follow Up Needs: HH/OP  Clinical Impression Pleasant female who is motivated to work but is having much pain with her hip fracture. Her two daughter's are involved, one is local and her husband can assist also if needed. He is missing her at home. Aware evaluating her today and setting goals with. Daughter is here and will be involved while here. Will work on discharge needs. Pt seems to be coping appropriately and verbalizing her concerns.  Elease Hashimoto 01/07/2020, 9:29 AM

## 2020-01-07 NOTE — IPOC Note (Signed)
Individualized overall Plan of Care [facility] Patient Details Name: Alison Rivera MRN: [id number] DOB: Jun 15, 1931  Admitting Diagnosis: Intertrochanteric fracture of right hip [facility]  Hospital Problems: Principal Problem:   Intertrochanteric fracture of right hip (Alison Rivera) Active Problems:   Hypoalbuminemia due to protein-calorie malnutrition ([place])   Hyperglycemia   Sleep disturbance     Functional Problem List: Nursing Medication Management, Motor, Pain, Skin Integrity, Bladder, Bowel  PT Balance, Endurance, Motor, Pain  OT Balance, Endurance, Motor, Pain  SLP    TR         Basic ADL's: OT Grooming, Bathing, Dressing, Toileting     Advanced  ADL's: OT Simple Meal Preparation     Transfers: PT Bed Mobility, Bed to Chair, Car, Manufacturing systems engineer, Metallurgist: PT Ambulation, Emergency planning/management officer, Stairs     Additional Impairments: OT None  SLP        TR      Anticipated Outcomes Item Anticipated Outcome  Self Feeding independent  Swallowing      Basic self-care  Mod I dressing, S bathing  Toileting  Mod I   Bathroom Transfers Mod I to toilet, S to shower  Bowel/Bladder  Pt will manage bowel and bladder with min assist  Transfers  supervision with LRAD  Locomotion  supervision with LRAD  Communication     Cognition     Pain  Pt will manage pain at 3 or less on a scale of 0-10  Safety/Judgment  Pt will remain free of falls with injury while in rehab with min assist/cues.   Therapy Plan: PT Intensity: Minimum of 1-2 x/day ,45 to 90 minutes PT Frequency: 5 out of 7 days PT Duration Estimated Length of Stay: 14-16 days OT Intensity: Minimum of 1-2 x/day, 45 to 90 minutes OT Frequency: 5 out of 7 days OT Duration/Estimated Length of Stay: 12-14 days      Team Interventions: Nursing Interventions Patient/Family Education, Bladder Management, Bowel Management, Skin Care/Wound Management, Medication Management, Pain Management, Discharge  Planning  PT interventions Ambulation/gait training, Discharge planning, Functional mobility training, Therapeutic Activities, Balance/vestibular training, Disease management/prevention, Neuromuscular re-education, Skin care/wound management, Therapeutic Exercise, Wheelchair propulsion/positioning, DME/adaptive equipment instruction, Pain management, Splinting/orthotics, UE/LE Strength taining/ROM, Community reintegration, Technical sales engineer stimulation, Barrister's clerk education, IT trainer, UE/LE Coordination activities  OT Interventions Training and development officer, Discharge planning, Functional mobility training, DME/adaptive equipment instruction, Pain management, Patient/family education, Psychosocial support, Therapeutic Activities, Self Care/advanced ADL retraining, Therapeutic Exercise, UE/LE Strength taining/ROM  SLP Interventions    TR Interventions    SW/CM Interventions Discharge Planning, Psychosocial Support, Patient/Family Education   Barriers to Discharge MD  Medical stability  Nursing      PT Inaccessible home environment, Home environment access/layout, Other (comments) pain, 2 STE with 0 rails  OT      SLP      SW       Team Discharge Planning: Destination: PT-Home ,OT- Home , SLP-  Projected Follow-up: PT-Home health PT, OT-  Home health OT, SLP-  Projected Equipment Needs: PT-To be determined, OT- To be determined, SLP-  Equipment Details: PT-has none, OT-  Patient/family involved in discharge planning: PT- Patient,  OT-Patient, SLP-   MD ELOS: 12-15 days. Medical Rehab Prognosis:  Good Assessment: 86 year old right-handed female with history of mitral valve prolapse, osteoporosis, hypertension.  She presented on 11/39/21 after falling asleep on the toilet and falling landing on her right side.  Denies LOC.  Sustained right intertrochanteric hip fracture.  Admission chemistries unremarkable  except calcium 7.1, hemoglobin 10.7.  Underwent open treatment of  intertrochanteric fracture with intramedullary implant on 01/04/2020 per Dr. Erlinda Hong.  Weightbearing as tolerated right lower extremity.  Lovenox for DVT prophylaxis.  Hospital course further complicated by acute blood loss anemia, 8.0 and monitored.  Patient noted a "pop" on day of admission with therapies, discussed with attending MD and stat hip x-ray ordered, which was unremarkable.    Patient with resulting functional deficits with mobility, endurance, self-care.  We will set goals for Supervision/Mod I with PT/OT.   Due to the current state of emergency, patients may not be receiving their 3-hours of Medicare-mandated therapy.  See Team Conference Notes for weekly updates to the plan of care

## 2020-01-07 NOTE — Progress Notes (Signed)
[facility] Individual Statement of Services  Patient Name:  Alison Rivera  Date:  01/07/2020  Welcome to the [facility].  Our goal is to provide you with an individualized program based on your diagnosis and situation, designed to meet your specific needs.  With this comprehensive rehabilitation program, you will be expected to participate in at least 3 hours of rehabilitation therapies Monday-Friday, with modified therapy programming on the weekends.  Your rehabilitation program will include the following services:  Physical Therapy (PT), Occupational Therapy (OT), 24 hour per day rehabilitation nursing, Care Coordinator, Rehabilitation Medicine, Nutrition Services and Pharmacy Services  Weekly team conferences will be held on Wednesday to discuss your progress.  Your Inpatient Rehabilitation Care Coordinator will talk with you frequently to get your input and to update you on team discussions.  Team conferences with you and your family in attendance may also be held.  Expected length of stay: 13-15 days  Overall anticipated outcome: supervision with cues  Depending on your progress and recovery, your program may change. Your Inpatient Rehabilitation Care Coordinator will coordinate services and will keep you informed of any changes. Your Inpatient Rehabilitation Care Coordinator's name and contact numbers are listed  below.  The following services may also be recommended but are not provided by the [facility] will be made to provide these services after discharge if needed.  Arrangements include referral to agencies that provide these services.  Your insurance has been verified to be: [place]  Your primary doctor is:  Marton Redwood  Pertinent information will be shared with your doctor and your insurance  company.  Inpatient Rehabilitation Care Coordinator:  Ovidio Kin, [place] or Emilia Beck  Information discussed with and copy given to patient by: Elease Hashimoto, 01/07/2020, 9:31 AM

## 2020-01-07 NOTE — Progress Notes (Signed)
Inpatient Rehabilitation  Patient information reviewed and entered into eRehab system by Griffin Dewilde M. Tivis Wherry, M.A., CCC/SLP, PPS Coordinator.  Information including medical coding, functional ability and quality indicators will be reviewed and updated through discharge.    

## 2020-01-07 NOTE — Progress Notes (Signed)
Physical Therapy Session Note  Patient Details  Name: Alison Rivera MRN: [id number] Date of Birth: 07-26-1931  Today's Date: 01/07/2020 PT Individual Time: 1500-1526 PT Individual Time Calculation (min): 26 min   Short Term Goals: Week 1:  PT Short Term Goal 1 (Week 1): pt will perform bed mobility with min A overall PT Short Term Goal 2 (Week 1): Pt will transfer bed<>WC with LRAD and CGA PT Short Term Goal 3 (Week 1): Pt will ambulate 65ft with LRAD and CGA  Skilled Therapeutic Interventions/Progress Updates:   Received pt sitting in [facility] with daughter, husband, and minister present at bedside, pt agreeable to therapy, and reported pain 1/10 in RLE at rest increasing to 7/10 pain with mobility. Repositioning, rest breaks, and distraction done to reduce pain levels. Discussion had regarding pain management strategies and importance of staying on top of pain medication prior to start of therapy sessions; pt verbalized understanding. Session with emphasis on functional mobility/transfers, generalized strengthening, dynamic standing balance/coordination, ambulation, and improved activity tolerance. Pt transferred sit<>stand with RW and min A x 3 trials with cues to push up from [facility] armrests rather than pulling up on RW with BUE. Pt ambulated 89ft x 2 trials and 15ft x 1 trial with RW and min A overall. Pt continues to demonstrate antalgic gait, decreased weight shifting to R, and decreased bilateral foot clearance. Pt required cues for RW safety, turning/pivoting technique, posture/gaze, and to increase step length for safety. Discussed with pt and husband equipment available at home. Pt's husband reported their neighbor has 2 RWs and a WC. Therapist discussed potential that pt may require RW at discharge with the hopes that pt will not require WC; pt and pt's husband accepting to this education. Concluded session with pt sitting in WC, needs within reach, and seatbelt alarm on. Husband present at bedside.    Therapy Documentation Precautions:  Restrictions Weight Bearing Restrictions: Yes RLE Weight Bearing: Weight bearing as tolerated   Therapy/Group: Individual Therapy [name] PT, DPT   01/07/2020, 7:27 AM

## 2020-01-07 NOTE — Evaluation (Signed)
Physical Therapy Assessment and Plan  Patient Details  Name: Alison Rivera MRN: [id number] Date of Birth: Feb 04, 1932  PT Diagnosis: Abnormal posture, Abnormality of gait, Coordination disorder, Difficulty walking, Muscle weakness and Pain in R hip Rehab Potential: Good ELOS: 14-16 days   Today's Date: 01/07/2020 PT Individual Time: 0800-0853 PT Individual Time Calculation (min): 53 min    Hospital Problem: Principal Problem:   Intertrochanteric fracture of right hip ([place]) Active Problems:   Hypoalbuminemia due to protein-calorie malnutrition ([place])   Hyperglycemia   Sleep disturbance   Past Medical History:  Past Medical History:  Diagnosis Date  . Chest pain, atypical   . Lichen planus   . Mitral valve prolapse   . Osteoporosis   . Squamous cell skin cancer    Past Surgical History:  Past Surgical History:  Procedure Laterality Date  . im nail right hip Right 01/04/2020  . INTRAMEDULLARY (IM) NAIL INTERTROCHANTERIC Right 01/04/2020   Procedure: INTRAMEDULLARY (IM) NAIL INTERTROCHANTRIC;  Surgeon: Leandrew Koyanagi, MD;  Location: [facility];  Service: Orthopedics;  Laterality: Right;  . NOSE SURGERY      Assessment & Plan Clinical Impression: Patient is a 86 y.o. year old female with history of mitral valve prolapse, osteoporosis, hypertension.  Presented 01/04/2020 after falling asleep on the toilet and falling landing on her right side.  Denied loss of consciousness.  Sustained right intertrochanteric hip fracture.  Admission chemistries unremarkable except calcium 7.1, hemoglobin 10.7.  Underwent open treatment of intertrochanteric fracture with intramedullary implant 01/04/2020 per Dr. Erlinda Hong.  Weightbearing as tolerated right lower extremity.  Lovenox for DVT prophylaxis.  Acute blood loss anemia 8.0 and monitored.    Patient currently requires mod with mobility secondary to muscle weakness and muscle joint tightness, decreased cardiorespiratoy endurance and decreased standing  balance, decreased postural control and decreased balance strategies.  Prior to hospitalization, patient was independent  with mobility and lived with Spouse in a House home.  Home access is 2Stairs to enter.  Patient will benefit from skilled PT intervention to maximize safe functional mobility, minimize fall risk and decrease caregiver burden for planned discharge home with 24 hour supervision.  Anticipate patient will benefit from follow up [place] at discharge.  PT - End of Session Activity Tolerance: Tolerates 30+ min activity with multiple rests Endurance Deficit: Yes Endurance Deficit Description: pt SOB with minimal activity PT Assessment Rehab Potential (ACUTE/IP ONLY): Good PT Barriers to Discharge: [place] home environment;Home environment access/layout;Other (comments) PT Barriers to Discharge Comments: pain, 2 STE with 0 rails PT Patient demonstrates impairments in the following area(s): Balance;Endurance;Motor;Pain PT Transfers Functional Problem(s): Bed Mobility;Bed to Chair;Car;Furniture PT Locomotion Functional Problem(s): Ambulation;Wheelchair Mobility;Stairs PT Plan PT Intensity: Minimum of 1-2 x/day ,45 to 90 minutes PT Frequency: 5 out of 7 days PT Duration Estimated Length of Stay: 14-16 days PT Treatment/Interventions: Ambulation/gait training;Discharge planning;Functional mobility training;Therapeutic Activities;Balance/vestibular training;Disease management/prevention;Neuromuscular re-education;Skin care/wound management;Therapeutic Exercise;Wheelchair propulsion/positioning;DME/adaptive equipment instruction;Pain management;Splinting/orthotics;UE/LE Strength taining/ROM;Community reintegration;Functional electrical stimulation;Patient/family education;Stair training;UE/LE Coordination activities PT Transfers Anticipated Outcome(s): supervision with LRAD PT Locomotion Anticipated Outcome(s): supervision with LRAD PT Recommendation Follow Up Recommendations: Home health  PT Patient destination: Home Equipment Recommended: To be determined Equipment Details: has none  PT Evaluation Precautions/Restrictions Precautions Precautions: Fall Restrictions Weight Bearing Restrictions: Yes RLE Weight Bearing: Weight bearing as tolerated Home Living/Prior Functioning Home Living Living Arrangements: Spouse/significant other Available Help at Discharge: Family;Available 24 hours/day (daughter would be available to assist) Type of Home: House Home Access: Stairs to enter [employer] of Steps:  2 Entrance Stairs-Rails: None Home Layout: Multi-level;Able to live on main level with bedroom/bathroom Alternate Level Stairs-Number of Steps: flight Alternate Level Stairs-Rails: Left Bathroom Shower/Tub: Multimedia programmer: Standard Bathroom Accessibility: Yes  Lives With: Spouse Prior Function Level of Independence: Independent with basic ADLs;Independent with gait;Independent with homemaking with ambulation;Independent with transfers  Able to Take Stairs?: Yes Driving: Yes Vocation: Other (Comment) 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 Cognition Overall Cognitive Status: Within Functional Limits for tasks assessed Arousal/Alertness: Awake/alert Orientation Level: Oriented X4 Memory: Appears intact Awareness: Appears intact Problem Solving: Impaired Safety/Judgment: Appears intact Comments: slow processing time, pt distracted by pain Sensation Sensation Light Touch: Appears Intact Proprioception: Appears Intact Coordination Gross Motor Movements are Fluid and Coordinated: No Fine Motor Movements are Fluid and Coordinated: Yes Coordination and Movement Description: grossly uncoordinated due to pain, generalized weakness, decreased balance stragegies, and poor activity tolerance Finger Nose Finger Test: [facility] bilaterally Heel Shin Test: decreased ROM on LLE unable to actively lift RLE due to weakness Motor   Motor Motor: Abnormal postural alignment and control Motor - Skilled Clinical Observations: grossly uncoordinated due to pain, generalized weakness, decreased balance stragegies, and poor activity tolerance  Trunk/Postural Assessment  Cervical Assessment Cervical Assessment: Exceptions to [facility] (forward head) Thoracic Assessment Thoracic Assessment: Exceptions to [facility] (rounded shoulders) Lumbar Assessment Lumbar Assessment: Exceptions to [facility] (posterior pelvic tilt) Postural Control Postural Control: Deficits on evaluation  Balance Balance Balance Assessed: Yes Static Sitting Balance Static Sitting - Balance Support: Feet supported;Bilateral upper extremity supported Static Sitting - Level of Assistance: 5: Stand by assistance (supervision) Dynamic Sitting Balance Dynamic Sitting - Balance Support: Feet supported;Bilateral upper extremity supported Dynamic Sitting - Level of Assistance: 5: Stand by assistance (supervision) Static Standing Balance Static Standing - Balance Support: Bilateral upper extremity supported (RW) Static Standing - Level of Assistance: 4: Min assist Dynamic Standing Balance Dynamic Standing - Balance Support: Bilateral upper extremity supported (RW) Dynamic Standing - Level of Assistance: 3: Mod assist Extremity Assessment  RLE Assessment RLE Assessment: Exceptions to [facility] General Strength Comments: limited by pain (unable to perform hip flexion or knee flexion/extension) ankle PF/DF 3/5 LLE Assessment LLE Assessment: Exceptions to [facility] General Strength Comments: grossly generalized to 3+/5  Care Tool Care Tool Bed Mobility Roll left and right activity   Roll left and right assist level: Supervision/Verbal cueing (bedrails)    Sit to lying activity        Lying to sitting edge of bed activity   Lying to sitting edge of bed assist level: Maximal Assistance - Patient 25 - 49%     Care Tool Transfers Sit to stand transfer   Sit to stand assist level:  Moderate Assistance - Patient 50 - 74%    Chair/bed transfer   Chair/bed transfer assist level: Moderate Assistance - Patient 50 - 74%     Toilet transfer   Assist Level: Moderate Assistance - Patient 50 - 74%    Car transfer Car transfer activity did not occur: Safety/medical concerns (pain, generalized weakness, decreased balance)        Care Tool Locomotion Ambulation   Assist level: Moderate Assistance - Patient 50 - 74% Assistive device: Walker-rolling Max distance: 70f  Walk 10 feet activity Walk 10 feet activity did not occur: Safety/medical concerns (pain, generalized weakness, decreased balance)       Walk 50 feet with 2 turns activity Walk 50 feet with 2 turns activity did not occur: Safety/medical concerns (pain, generalized weakness, decreased  balance)      Walk 150 feet activity Walk 150 feet activity did not occur: Safety/medical concerns (pain, generalized weakness, decreased balance)      Walk 10 feet on uneven surfaces activity Walk 10 feet on uneven surfaces activity did not occur: Safety/medical concerns (pain, generalized weakness, decreased balance)      Stairs Stair activity did not occur: Safety/medical concerns (pain, generalized weakness, decreased balance)        Walk up/down 1 step activity Walk up/down 1 step or curb (drop down) activity did not occur: Safety/medical concerns (pain, generalized weakness, decreased balance)     Walk up/down 4 steps activity did not occuR: Safety/medical concerns (pain, generalized weakness, decreased balance)  Walk up/down 4 steps activity      Walk up/down 12 steps activity Walk up/down 12 steps activity did not occur: Safety/medical concerns (pain, generalized weakness, decreased balance)      Pick up small objects from floor Pick up small object from the floor (from standing position) activity did not occur: Safety/medical concerns (pain, generalized weakness, decreased balance)      Wheelchair Will patient  use wheelchair at discharge?: Yes Type of Wheelchair: Manual   Wheelchair assist level: Supervision/Verbal cueing Max wheelchair distance: 131f  Wheel 50 feet with 2 turns activity   Assist Level: Supervision/Verbal cueing  Wheel 150 feet activity   Assist Level: Supervision/Verbal cueing    Refer to Care Plan for Long Term Goals  SHORT TERM GOAL WEEK 1 PT Short Term Goal 1 (Week 1): pt will perform bed mobility with min A overall PT Short Term Goal 2 (Week 1): Pt will transfer bed<>WC with LRAD and CGA PT Short Term Goal 3 (Week 1): Pt will ambulate 1102fwith LRAD and CGA  Recommendations for other services: None   Skilled Therapeutic Intervention Evaluation completed (see details above and below) with education on PT POC and goals and individual treatment initiated with focus on functional mobility/transfers generalized strengthening, dynamic standing balance/coordination, ambulation, and improved activity tolerance. Received pt supine in bed, pt educated on PT evaluation, CIR policies, and therapy schedule and agreeable. Pt denied any pain at rest but reported increased R hip pain to 9/10 with mobility. RN notified and administered pain medication during session. Therapist provided pt with RW and scrub clothes as pt reported her daughter will be bringing clothes later today. Donned underwear and pants in supine with max A and pt rolled L and R with supervision and use of bedrails and required max A to pull pants over hips. Pt transferred supine<>sitting EOB from flat bed using bedrails with max A with increased time and total A to manage RLE and trunk due to pain. Doffed dirty gown and donned clean pull over shirt with min A. Pt transferred sit<>stand with RW and mod A and transferred stand<>pivot bed<>WC with RW and mod A with max cues for pivoting. Pt with difficulty bearing weight on RLE and therefore unable to lift LLE. Pt performed WC mobility [id number] BUE and supervision to therapy  gym. Pt transferred sit<>stand with RW and min A with cues for hand placement on WC armrests when standing and ambulated [id number] RW and mod A. Pt severely limited by pain and with difficulty lifting LLE resulting in pt scooting it forward along floor. [facility] Concluded session with pt sitting in WC, needs within reach, and seatbelt alarm on. RN present attending to care.   Mobility Bed Mobility Bed Mobility:  Rolling Right;Rolling Left;Supine to Sit Rolling Right: Supervision/verbal cueing Rolling Left: Supervision/Verbal cueing Supine to Sit: Maximal Assistance - Patient - Patient 25-49% Transfers Transfers: Sit to Stand;Stand to Sit;Stand Pivot Transfers Sit to Stand: Moderate Assistance - Patient 50-74% Stand to Sit: Minimal Assistance - Patient > 75% Stand Pivot Transfers: Moderate Assistance - Patient 50 - 74% Stand Pivot Transfer Details: Verbal cues for sequencing;Verbal cues for technique;Verbal cues for safe use of DME/AE Stand Pivot Transfer Details (indicate cue type and reason): verbal cues for RW safety, weight shifting, and pivoting technique Transfer (Assistive device): Rolling walker Locomotion  Gait Ambulation: Yes Gait Assistance: Moderate Assistance - Patient 50-74% Gait Distance (Feet): 4 Feet Assistive device: Rolling walker Gait Assistance Details: Verbal cues for gait pattern;Verbal cues for sequencing;Verbal cues for safe use of DME/AE;Verbal cues for technique Gait Assistance Details: verbal cues for weight shifiting and RW placement to advance LLE, however pt with increased difficulty weight shifting onto RLE Gait Gait: Yes Gait Pattern: Impaired Gait Pattern: Step-to pattern;Decreased stance time - right;Decreased dorsiflexion - right;Decreased trunk rotation;Antalgic;Decreased stride length;Decreased step length - right;Decreased step length - left;Decreased weight shift to right;Trunk flexed;Poor foot clearance - left;Poor  foot clearance - right;Narrow base of support Gait velocity: Decreased Wheelchair Mobility Wheelchair Mobility: Yes Wheelchair Assistance: Chartered loss adjuster: Both upper extremities Wheelchair Parts Management: Needs assistance Distance: 124f   Discharge Criteria: Patient will be discharged from PT if patient refuses treatment 3 consecutive times without medical reason, if treatment goals not met, if there is a change in medical status, if patient makes no progress towards goals or if patient is discharged from hospital.  The above assessment, treatment plan, treatment alternatives and goals were discussed and mutually agreed upon: by patient  AAlfonse AlpersPT, DPT  01/07/2020, 12:17 PM

## 2020-01-08 ENCOUNTER — Inpatient Hospital Stay (HOSPITAL_COMMUNITY): Payer: Medicare Other | Admitting: Physical Therapy

## 2020-01-08 ENCOUNTER — Inpatient Hospital Stay (HOSPITAL_COMMUNITY): Payer: Medicare Other

## 2020-01-08 ENCOUNTER — Inpatient Hospital Stay (HOSPITAL_COMMUNITY): Payer: Medicare Other | Admitting: Occupational Therapy

## 2020-01-08 DIAGNOSIS — S72144D Nondisplaced intertrochanteric fracture of right femur, subsequent encounter for closed fracture with routine healing: Secondary | ICD-10-CM | POA: Diagnosis not present

## 2020-01-08 LAB — CBC WITH DIFFERENTIAL/PLATELET
Abs Immature Granulocytes: 0.03 10*3/uL (ref 0.00–0.07)
Basophils Absolute: 0 10*3/uL (ref 0.0–0.1)
Basophils Relative: 1 %
Eosinophils Absolute: 0.1 10*3/uL (ref 0.0–0.5)
Eosinophils Relative: 1 %
HCT: 22.2 % — ABNORMAL LOW (ref 36.0–46.0)
Hemoglobin: 7.5 g/dL — ABNORMAL LOW (ref 12.0–15.0)
Immature Granulocytes: 1 %
Lymphocytes Relative: 20 %
Lymphs Abs: 1.3 10*3/uL (ref 0.7–4.0)
MCH: 31.6 pg (ref 26.0–34.0)
MCHC: 33.8 g/dL (ref 30.0–36.0)
MCV: 93.7 fL (ref 80.0–100.0)
Monocytes Absolute: 0.7 10*3/uL (ref 0.1–1.0)
Monocytes Relative: 11 %
Neutro Abs: 4.4 10*3/uL (ref 1.7–7.7)
Neutrophils Relative %: 66 %
Platelets: 181 10*3/uL (ref 150–400)
RBC: 2.37 MIL/uL — ABNORMAL LOW (ref 3.87–5.11)
RDW: 14 % (ref 11.5–15.5)
WBC: 6.5 10*3/uL (ref 4.0–10.5)
nRBC: 0 % (ref 0.0–0.2)

## 2020-01-08 MED ORDER — ACETAMINOPHEN 325 MG PO TABS
650.0000 mg | ORAL_TABLET | Freq: Three times a day (TID) | ORAL | Status: DC
  Administered 2020-01-08 – 2020-01-21 (×32): 650 mg via ORAL
  Filled 2020-01-08 (×38): qty 2

## 2020-01-08 MED ORDER — TRAZODONE HCL 50 MG PO TABS
50.0000 mg | ORAL_TABLET | Freq: Every day | ORAL | Status: DC
  Administered 2020-01-08 – 2020-01-11 (×4): 50 mg via ORAL
  Filled 2020-01-08 (×4): qty 1

## 2020-01-08 NOTE — Progress Notes (Addendum)
Patient ID: Alison Rivera, female   DOB: 1931/03/18, 86 y.o.   MRN: [id number]  Met with pt and daughter-[name] who is present to address her questions and discuss with pt her pain issues and encourage her to take pain meds and her diet. She wants to be on a regular diet due to heart healthy she can not have lasagna or the stir fry she wants. Daughter feels she is almost 45 yo and can have what she wants. Have sent a message to Pam[employer]PA regarding this.  12:19 PM Pam-PA has written or regular diet and RN aware and have informed pt of this.

## 2020-01-08 NOTE — Progress Notes (Signed)
[place] PHYSICAL MEDICINE & REHABILITATION PROGRESS NOTE  Subjective/Complaints: May d/c IV Continues to have pain with movement and lower back pain when in bed- agreeable to heating pad for lower back and scheduling tylenol for better pain control.   ROS: Denies CP, SOB, N/V/D  Objective: Vital Signs: Blood pressure (!) 126/55, pulse 73, temperature 98.3 F (36.8 C), resp. rate 18, height 5\' 4"  (1.626 m), weight 57.4 kg, SpO2 97 %. DG HIP UNILAT WITH PELVIS 2-3 VIEWS RIGHT  Result Date: 01/06/2020 CLINICAL DATA:  Onset pain after the patient felt a pop today. The patient suffered a right intertrochanteric fracture in a fall 01/04/2020 and underwent internal fixation that same day. EXAM: DG HIP (WITH OR WITHOUT PELVIS) 2-3V RIGHT COMPARISON:  Plain films of the pelvis and right hip 01/04/2020. FINDINGS: There is no acute bony or joint abnormality. Fixation hardware for the patient's intertrochanteric fracture is intact and normal in appearance. Position and alignment of the fracture are unchanged. Attenuated appearance of the inferior pubic rami bilaterally is also unchanged. IMPRESSION: No acute abnormality. Status post fixation of a right intertrochanteric fracture. The appearance is unchanged. Electronically Signed   By: Inge Rise M.D.   On: 01/06/2020 14:35   VAS [place] LOWER EXTREMITY VENOUS (DVT)  Result Date: 01/07/2020  Lower Venous DVT Study Indications: Swelling, post-op RT hip.  Risk Factors: Surgery 01-04-2020 Intramedullary nail intertrochantric RT hip. Anticoagulation: Lovenox. Comparison Study: No prior studies. Performing Technologist: Darlin Coco, RDMS  Examination Guidelines: A complete evaluation includes B-mode imaging, spectral Doppler, color Doppler, and power Doppler as needed of all accessible portions of each vessel. Bilateral testing is considered an integral part of a complete examination. Limited examinations for reoccurring indications may be performed as noted.  The reflux portion of the exam is performed with the patient in reverse Trendelenburg.  +---------+---------------+---------+-----------+----------+--------------+ RIGHT    CompressibilityPhasicitySpontaneityPropertiesThrombus Aging +---------+---------------+---------+-----------+----------+--------------+ CFV      Full           Yes      Yes                                 +---------+---------------+---------+-----------+----------+--------------+ SFJ      Full                                                        +---------+---------------+---------+-----------+----------+--------------+ FV Prox  Full                                                        +---------+---------------+---------+-----------+----------+--------------+ FV Mid   Full                                                        +---------+---------------+---------+-----------+----------+--------------+ FV DistalFull                                                        +---------+---------------+---------+-----------+----------+--------------+  PFV      Full                                                        +---------+---------------+---------+-----------+----------+--------------+ POP      Full           Yes      Yes                                 +---------+---------------+---------+-----------+----------+--------------+ PTV      Full                                                        +---------+---------------+---------+-----------+----------+--------------+ PERO     Full                                                        +---------+---------------+---------+-----------+----------+--------------+   +---------+---------------+---------+-----------+----------+--------------+ LEFT     CompressibilityPhasicitySpontaneityPropertiesThrombus Aging +---------+---------------+---------+-----------+----------+--------------+ CFV      Full           Yes       Yes                                 +---------+---------------+---------+-----------+----------+--------------+ SFJ      Full                                                        +---------+---------------+---------+-----------+----------+--------------+ FV Prox  Full                                                        +---------+---------------+---------+-----------+----------+--------------+ FV Mid   Full                                                        +---------+---------------+---------+-----------+----------+--------------+ FV DistalFull                                                        +---------+---------------+---------+-----------+----------+--------------+ PFV      Full                                                        +---------+---------------+---------+-----------+----------+--------------+  POP      Full           Yes      Yes                                 +---------+---------------+---------+-----------+----------+--------------+ PTV      Full                                                        +---------+---------------+---------+-----------+----------+--------------+ PERO     Full                                                        +---------+---------------+---------+-----------+----------+--------------+     Summary: RIGHT: - There is no evidence of deep vein thrombosis in the lower extremity.  - No cystic structure found in the popliteal fossa.  LEFT: - There is no evidence of deep vein thrombosis in the lower extremity.  - No cystic structure found in the popliteal fossa.  *See table(s) above for measurements and observations.    Preliminary    Recent Labs    01/07/20 0537 01/08/20 0453  WBC 6.7 6.5  HGB 7.6* 7.5*  HCT 22.4* 22.2*  PLT 159 181   Recent Labs    01/06/20 0144 01/07/20 0537  NA 132* 137  K 4.0 3.9  CL 99 99  CO2 24 29  GLUCOSE 119* 109*  BUN 20 14  CREATININE 0.91 0.73   CALCIUM 8.6* 8.8*    Intake/Output Summary (Last 24 hours) at 01/08/2020 1215 Last data filed at 01/07/2020 2045 Gross per 24 hour  Intake 780 ml  Output --  Net 780 ml        Physical Exam: BP (!) 126/55 (BP Location: Right Arm)   Pulse 73   Temp 98.3 F (36.8 C)   Resp 18   Ht 5\' 4"  (1.626 m)   Wt 57.4 kg   SpO2 97%   BMI 21.72 kg/m  Gen: no distress, normal appearing HEENT: oral mucosa pink and moist, NCAT Cardio: Reg rate Chest: normal effort, normal rate of breathing Abd: soft, non-distended Ext: no edema Skin: Warm and dry.  Right hip with dressings CDI Psych: Normal mood.  Normal behavior. Musc: Right hip with edema and mild tenderness Neuro: Alert Motor:  Right lower extremity: Hip flexion, knee extension 1+/5 (pain inhibition), ankle dorsiflexion 4+/5, unchanged Left lower extremity: Hip flexion, knee extension 4-4+/5 (pain inhibition), ankle dorsiflexion 4+/5  Assessment/Plan: 1. Functional deficits which require 3+ hours per day of interdisciplinary therapy in a comprehensive inpatient rehab setting.  Physiatrist is providing close team supervision and 24 hour management of active medical problems listed below.  Physiatrist and rehab team continue to assess barriers to discharge/monitor patient progress toward functional and medical goals   Care Tool:  Bathing    Body parts bathed by patient: Right arm, Left arm, Chest, Abdomen, Front perineal area, Right upper leg, Left upper leg, Face   Body parts bathed by helper: Buttocks, Right lower leg, Left lower leg     Bathing assist Assist Level: Moderate Assistance - Patient 50 -  74%     Upper Body Dressing/Undressing Upper body dressing   What is the patient wearing?: Pull over shirt    Upper body assist Assist Level: Set up assist    Lower Body Dressing/Undressing Lower body dressing      What is the patient wearing?: Underwear/pull up     Lower body assist Assist for lower body  dressing: Maximal Assistance - Patient 25 - 49%     Toileting Toileting    Toileting assist Assist for toileting: Maximal Assistance - Patient 25 - 49%     Transfers Chair/bed transfer  Transfers assist     Chair/bed transfer assist level: Minimal Assistance - Patient > 75%     Locomotion Ambulation   Ambulation assist      Assist level: Moderate Assistance - Patient 50 - 74% Assistive device: Walker-rolling Max distance: 55ft   Walk 10 feet activity   Assist  Walk 10 feet activity did not occur: Safety/medical concerns (pain, generalized weakness, decreased balance)        Walk 50 feet activity   Assist Walk 50 feet with 2 turns activity did not occur: Safety/medical concerns (pain, generalized weakness, decreased balance)         Walk 150 feet activity   Assist Walk 150 feet activity did not occur: Safety/medical concerns (pain, generalized weakness, decreased balance)         Walk 10 feet on uneven surface  activity   Assist Walk 10 feet on uneven surfaces activity did not occur: Safety/medical concerns (pain, generalized weakness, decreased balance)         Wheelchair     Assist Will patient use wheelchair at discharge?: Yes Type of Wheelchair: Manual    Wheelchair assist level: Supervision/Verbal cueing Max wheelchair distance: 170ft    Wheelchair 50 feet with 2 turns activity    Assist        Assist Level: Supervision/Verbal cueing   Wheelchair 150 feet activity     Assist      Assist Level: Supervision/Verbal cueing    Medical Problem List and Plan: 1.  Decreased functional ability secondary to right intertrochanteric hip fracture.  Status post open treatment intramedullary implant 01/04/2020.  Weightbearing as tolerated  Begin CIR evaluations  Tolerating therapy despite pain.   2.  Antithrombotics: -DVT/anticoagulation: Lovenox.               Venous Dopplers- no evidence of clots.               -antiplatelet therapy: N/A 3. Pain Management: Hydrocodone and Robaxin as needed             Monitor with increased exertion. Schedule Tylenol, add heating pad for low back pain.  4. Mood: Zoloft 50 mg daily             -antipsychotic agents: N/A 5. Neuropsych: This patient is capable of making decisions on her own behalf. 6. Skin/Wound Care: Routine skin checks 7. Fluids/Electrolytes/Nutrition: Routine in and outs 8.  Acute blood loss anemia.               Hemoglobin 7.6 on 12/2, 7.5 on 12/3  Will consider transfusion if necessary 9.  Hypertension.  Toprolol 25 daily             Monitor with increased activity 10.  Drug-induced constipation.  Colace 100 mg twice daily, MiraLAX daily             Increase bowel meds as necessary 11.  Hyperglycemia-?  Stress-induced  Will order hemoglobin A1c with next set of labs 12.  Hypoalbuminemia  Supplement initiated on 12/2 13.  Sleep disturbance  Increase trazodone to 50mg   LOS: 2 days A FACE TO FACE EVALUATION WAS PERFORMED  Alison Rivera Dionisios Ricci 01/08/2020, 12:15 PM

## 2020-01-08 NOTE — Progress Notes (Signed)
Occupational Therapy Session Note  Patient Details  Name: Alison Rivera MRN: [id number] Date of Birth: [birth year]  Today's Date: 01/08/2020 OT Individual Time: 1121-1201 OT Individual Time Calculation (min): 40 min    Short Term Goals: Week 1:  OT Short Term Goal 1 (Week 1): Pt will complete toilet transfer with CGA. OT Short Term Goal 2 (Week 1): Pt will complete toileting with CGA. OT Short Term Goal 3 (Week 1): Pt will complete LB dressing with min A.  Skilled Therapeutic Interventions/Progress Updates:    Treatment session with focus on sit > stand and dynamic standing balance.  Pt received seated upright in recliner with daughter visiting.  Pt declined any bathing or dressing this session but agreeable to therapy session in room.  Engaged in sit > stand from recliner progressing from min assist to [place] throughout session.  Engaged in table top task in standing with focus on dynamic standing balance and endurance.  Pt required min cues for sequencing through table top, peg board task.  Discussed functional carryover to grooming tasks or even IADLs.  Pt pleased with increased ease of sit <> stand and standing tolerance as pt stood 5-7 mins x2 during session.  Pt remained upright in recliner with seat belt alarm on and all needs in reach.  Therapy Documentation Precautions:  Precautions Precautions: Fall Restrictions Weight Bearing Restrictions: Yes RLE Weight Bearing: Weight bearing as tolerated General:   Vital Signs: Therapy Vitals Temp: 98.6 F (37 C) Pulse Rate: 64 Resp: 16 BP: (!) 119/42 Patient Position (if appropriate): Sitting Oxygen Therapy SpO2: 100 % O2 Device: Room Air Pain:  Pt with no c/o pain at rest, reports increased pain with activity but does not rate   Therapy/Group: Individual Therapy  Simonne Come 01/08/2020, 3:45 PM

## 2020-01-08 NOTE — Progress Notes (Signed)
Physical Therapy Session Note  Patient Details  Name: Alison Rivera MRN: [id number] Date of Birth: 1931-08-31  Today's Date: 01/08/2020 PT Individual Time: 0850-0955 PT Individual Time Calculation (min): 65 min   Short Term Goals: Week 1:  PT Short Term Goal 1 (Week 1): pt will perform bed mobility with min A overall PT Short Term Goal 2 (Week 1): Pt will transfer bed<>WC with LRAD and CGA PT Short Term Goal 3 (Week 1): Pt will ambulate 45ft with LRAD and CGA  Skilled Therapeutic Interventions/Progress Updates:   Received pt supine in bed reporting she didn't sleep well last night, pt agreeable to therapy, and reported low back pain from lying in bed and pain 9/10 in RLE with mobility. RN made aware. Repositioning, rest breaks, and distraction done to reduce pain levels. Session with emphasis on dressing, functional mobility/transfers, generalized strengthening, dynamic standing balance/coordination, simulated car transfers, and improved activity tolerance. Donned bilateral ted hose in supine with total A and donned underwear and pants with mod A to thread RLE through. Pt able to pull pants/underwear over hips without assist. Pt transferred supine<>R sidelying<>sitting EOB with mod/max A for RLE management and increased time due to pain. Doffed dirty gown and donned clean pull over shirt with supervision. Donned shoes sitting EOB with max A for time management purposes. Pt transferred stand<>pivot bed<>WC with RW and min A with cues for RW positioning and transfer technique. Pt transported to ortho gym in [facility] total A for time management purposes and performed simulated car transfer with RW and mod A. Pt required mod/max A for RLE management due to pain. Pt transported to dayroom in [facility] total A and transferred WC<>Nustep with RW and min A with cues for hand placement on WC armrests when pushing up. Worked on R hip ROM and cardiovascular endurance on Nustep at workload 2 for 8 minutes for a total of 129  steps with total A to position RLE on Nustep footplate. Noted pt with improvements in R hip flexion with repetition. Therapist encouraged ambulation, however pt politely declined stating "I've had it for now" and requested to return to room. Pt transported back to room in [facility] total A and transferred WC<>recliner stand<>pivot with RW and min A. Concluded session with pt sitting in recliner, needs within reach, and seatbelt alarm on. RN present at bedside providing medications.   Therapy Documentation Precautions:  Precautions Precautions: Fall Restrictions Weight Bearing Restrictions: Yes RLE Weight Bearing: Weight bearing as tolerated   Therapy/Group: Individual Therapy [name] PT, DPT   01/08/2020, 7:22 AM

## 2020-01-08 NOTE — Progress Notes (Signed)
Physical Therapy Session Note  Patient Details  Name: Alison Rivera MRN: [id number] Date of Birth: Mar 02, 1931  Today's Date: 01/08/2020 PT Individual Time: 0174-9449 PT Individual Time Calculation (min): 43 min   Short Term Goals: Week 1:  PT Short Term Goal 1 (Week 1): pt will perform bed mobility with min A overall PT Short Term Goal 2 (Week 1): Pt will transfer bed<>WC with LRAD and CGA PT Short Term Goal 3 (Week 1): Pt will ambulate 53ft with LRAD and CGA  Skilled Therapeutic Interventions/Progress Updates:    Pt received sitting in recliner and agreeable to therapy session. Pt reports she is only getting ~1hour of sleep at night and is very tired. Sit>stand recliner>RW with CGA/min assist for steadying/lifting - min cuing to push up from chair as opposed to pulling up on RW - good recall of this throughout session. R stand pivot recliner>w/c using RW with min assist for balance - demos significantly increased pain with R LE WBing and compensating via B UE support on RW - cuing for sequencing. Transported to/from gym in w/c for time management and energy conservation. L stand pivot w/c>EOM using RW with min assist and pt continuing to demo pain upon attempts at [place] therefore continues to compensate with increased B UE WBing through RW. Seated R LE long arc quads with external target as cue to move through full ROM 2x10 reps. Siting>supine on mat table with mod assist for B LE management onto bed.  Performed the following supine R LE exercises 2x10 reps each  - active assisted heel slides - mod/max assist provided to perform movement due to weakness - hip abduction/adduction - min assist to perform movement and cuing to avoid hip external rotation compensation Requires frequent rest breaks for pain management.  Supine>sitting on R EOM with min A for R LE management and cuing to use the movements just practice to increase pt independence. R stand pivot to w/c using RW with CGA and pt  shuffling on L LE as opposed to attempting to step in order to avoid R LE WBing due to pain. Transported back to room and pt requesting to sit in recliner. L stand pivot w/c>recliner using RW with CGA for steadying as described throughout. Pt left sitting in recliner with needs in reach and seat belt alarm on.  Therapy Documentation Precautions:  Precautions Precautions: Fall Restrictions Weight Bearing Restrictions: Yes RLE Weight Bearing: Weight bearing as tolerated  Pain:  Reports pain in R LE at rest is 2/10 and with movement increases to 6-7/10 - RN notified and present for medication administration.   Therapy/Group: Individual Therapy  Tawana Scale , PT, DPT, CSRS  01/08/2020, 3:37 PM

## 2020-01-09 ENCOUNTER — Inpatient Hospital Stay (HOSPITAL_COMMUNITY): Payer: Medicare Other | Admitting: Occupational Therapy

## 2020-01-09 ENCOUNTER — Inpatient Hospital Stay (HOSPITAL_COMMUNITY): Payer: Medicare Other | Admitting: Physical Therapy

## 2020-01-09 ENCOUNTER — Inpatient Hospital Stay (HOSPITAL_COMMUNITY): Payer: Medicare Other

## 2020-01-09 DIAGNOSIS — S72141S Displaced intertrochanteric fracture of right femur, sequela: Secondary | ICD-10-CM

## 2020-01-09 DIAGNOSIS — E8809 Other disorders of plasma-protein metabolism, not elsewhere classified: Secondary | ICD-10-CM | POA: Diagnosis not present

## 2020-01-09 DIAGNOSIS — D62 Acute posthemorrhagic anemia: Secondary | ICD-10-CM | POA: Diagnosis not present

## 2020-01-09 DIAGNOSIS — R739 Hyperglycemia, unspecified: Secondary | ICD-10-CM | POA: Diagnosis not present

## 2020-01-09 LAB — VITAMIN K1, SERUM: VITAMIN K1: 0.41 ng/mL (ref 0.10–2.20)

## 2020-01-09 NOTE — Progress Notes (Signed)
Physical Therapy Session Note  Patient Details  Name: Alison Rivera MRN: [id number] Date of Birth: 1931-04-11  Today's Date: 01/09/2020 PT Individual Time: 1550-1702 PT Individual Time Calculation (min): 72 min   Short Term Goals: Week 1:  PT Short Term Goal 1 (Week 1): pt will perform bed mobility with min A overall PT Short Term Goal 2 (Week 1): Pt will transfer bed<>WC with LRAD and CGA PT Short Term Goal 3 (Week 1): Pt will ambulate 91ft with LRAD and CGA  Skilled Therapeutic Interventions/Progress Updates:    Pt received sitting in recliner with her husband present and pt agreeable to therapy session. Pt's husband reports that pt has not been able to get comfortable all day due to R LE pain. Pt reports she didn't sleep again last night and that she got up at 4:00AM and has been sitting in the recliner all day - educated on importance of frequent mobility during the day to avoid R LE stiffness/pain. Donned B LE TED hose max assist - pt noticed to have R LE edema. Sit>stand recliner>RW with CGA for steadying and cuing to push up with hands on armrests as pt attempts to pull on RW. R stand pivot to w/c using RW, CGA for steadying - continues to demo significant R LE pain with WBing therefore compensates with BUE support on RW.  Transported to/from gym in w/c for time management and energy conservation. L stand pivot to EOM using RW, CGA for steadying - pt continuing to limit R LE WBing due to pain. Sitting EOM R LE long arc quads 2x7 reps with pt having increased pain with this movement today and unable to perform terminal knee extension through full ROM because of this. Sit>supine on mat with mod assist for B LE management.  In supine, performed the following R LE exercises 2x10 reps each:  - active assisted heel slides - requires max assist for LE movement, has to slowly progress to increased ROM due to pain - active assisted hip abduction/adduction - requires mod assist for LE movement - ankle  PF/DF 2x20 reps - bridging, therapist assisting with R LE positioning into hooklying, able to clear hips a minimal amount  Supine>sitting R EOM with min assist for R LE management - cuing for sequencing to move into long sitting as pivoting LEs off side of mat. Sit<>stands EOM<>RW with CGA for safety and continued cuing to push up from mat.   Standing with RW performed progressive R LE WBing - CGA/min assist for steadying throughout: - R/L weight shifting with 10second hold on R LE - R weight shift while taking R hand off RW but had too much pain with this therefore transitioned to taking L hand off RW - R weight shift while lifting L heel off the floor  Pt requires frequent rest breaks during session due to increased R LE pain with movement. R stand pivot to w/c using RW with CGA as described above. Transported back to room and requesting to return to recliner. L stand pivot using RW CGA as described above. Pt left sitting in recliner with needs in reach and seat belt alarm on.    Therapy Documentation Precautions:  Precautions Precautions: Fall Restrictions Weight Bearing Restrictions: Yes RLE Weight Bearing: Weight bearing as tolerated  Pain:  Reports pain increases to 7-8/10 with movement, pt appears to be in more pain compared to session with this therapist yesterday - RN notified and present for medication administration. Discussed with pt talking to RN  about timing medications with therapy sessions.  Therapy/Group: Individual Therapy  Tawana Scale , PT, DPT, CSRS  01/09/2020, 12:29 PM

## 2020-01-09 NOTE — Progress Notes (Signed)
Patient reported that she had a nose bleed at about 9 this morning.

## 2020-01-09 NOTE — Plan of Care (Signed)
  Problem: Consults Goal: RH GENERAL PATIENT EDUCATION Description: See Patient Education module for education specifics. Outcome: Progressing   Problem: RH BOWEL ELIMINATION Goal: RH STG MANAGE BOWEL WITH ASSISTANCE Description: STG Manage Bowel with min Assistance. Outcome: Progressing   Problem: RH BLADDER ELIMINATION Goal: RH STG MANAGE BLADDER WITH ASSISTANCE Description: STG Manage Bladder With Min Assistance Outcome: Progressing   Problem: RH SKIN INTEGRITY Goal: RH STG ABLE TO PERFORM INCISION/WOUND CARE W/ASSISTANCE Description: STG Able To Perform Incision/Wound Care With [employer]. Outcome: Progressing   Problem: RH PAIN MANAGEMENT Goal: RH STG PAIN MANAGED AT OR BELOW PT'S PAIN GOAL Description: <4 out of 10.  Outcome: Progressing   Problem: RH KNOWLEDGE DEFICIT GENERAL Goal: RH STG INCREASE KNOWLEDGE OF SELF CARE AFTER HOSPITALIZATION Description: Pt and family will verbalize understanding of self care including medication management, incision care, pain management, and follow up care at the time of discharge with min cues using provided educational materials.  Outcome: Progressing

## 2020-01-09 NOTE — Progress Notes (Signed)
Occupational Therapy Session Note  Patient Details  Name: Alison Rivera MRN: [id number] Date of Birth: [birth year]  Today's Date: 01/09/2020 Session 1 OT Individual Time: 0930-1000 OT Individual Time Calculation (min): 30 min   Session 2 OT Individual Time: 1030-1100 OT Individual Time Calculation (min): 30 min    Short Term Goals: Week 1:  OT Short Term Goal 1 (Week 1): Pt will complete toilet transfer with CGA. OT Short Term Goal 2 (Week 1): Pt will complete toileting with CGA. OT Short Term Goal 3 (Week 1): Pt will complete LB dressing with min A.  Skilled Therapeutic Interventions/Progress Updates:  Session 1   Patient greeted seated in recliner with bloody nose. Patient reported she gets bloody noses frequently. Pt reported they do not last too long and requested OT return in 30  Minutes. Bloody nose had stopped upon OT arrival and pt agreeable to get dressed. OT educated on use of reacher to help thread pant leg over painful R LE. Pt demonstrated technique with verbal cues and demonstration for technique. Sit<>stand with min A. Pt then ambulated 10 feet in room w/ RW and min A. Pt reported 10/10 pain and could not walk further. After rest break, pt completed 10 sit<>stands from recliner with RW and CGA. OT placed ice on R hip for pain management and pt left seated in recliner with needs met.   Session 2 Patient greeted in recliner and agreeable to OT treatment session. Pt completed sit<>stand and stand-pivot to wc with RW and CGA. Worked on standing balance/endurance. Pt stood on foam block with min A to get to standing and min A for balance with alternating UB there-ex. Pt with intermittent posterior LOB requiring mod A to correct. OT removed foam block and had patient stand on flat ground and complete 3 sets of 10 arm raises without LOB and no UE support. Pt returned to room and pivoted back to recliner in similar fashion. Pt left with ice on hip for pain management, spouse present,  and needs met.   Therapy Documentation Precautions:  Precautions Precautions: Fall Restrictions Weight Bearing Restrictions: Yes RLE Weight Bearing: Weight bearing as tolerated Pain:  10/10 hip pain.  Patient has received pain meds and OT applied ice for pain management ADL: ADL Eating: Independent Grooming: Setup Upper Body Bathing: Setup Lower Body Bathing: Moderate assistance Upper Body Dressing: Minimal assistance Lower Body Dressing: Maximal assistance Toileting: Maximal assistance Toilet Transfer: Moderate assistance Toilet Transfer Method: Stand pivot   Therapy/Group: Individual Therapy  Valma Cava 01/09/2020, 11:03 AM

## 2020-01-09 NOTE — Progress Notes (Signed)
[place] PHYSICAL MEDICINE & REHABILITATION PROGRESS NOTE  Subjective/Complaints: C/o leg pain but managing. Up to bathroom with tech this morning.   ROS: Patient denies fever, rash, sore throat, blurred vision, nausea, vomiting, diarrhea, cough, shortness of breath or chest pain,   headache, or mood change.    Objective: Vital Signs: Blood pressure (!) 127/52, pulse 66, temperature 98.5 F (36.9 C), resp. rate 18, height 5\' 4"  (1.626 m), weight 57.4 kg, SpO2 96 %. VAS [place] LOWER EXTREMITY VENOUS (DVT)  Result Date: 01/08/2020  Lower Venous DVT Study Indications: Swelling, post-op RT hip.  Risk Factors: Surgery 01-04-2020 Intramedullary nail intertrochantric RT hip. Anticoagulation: Lovenox. Comparison Study: No prior studies. Performing Technologist: Darlin Coco, RDMS  Examination Guidelines: A complete evaluation includes B-mode imaging, spectral Doppler, color Doppler, and power Doppler as needed of all accessible portions of each vessel. Bilateral testing is considered an integral part of a complete examination. Limited examinations for reoccurring indications may be performed as noted. The reflux portion of the exam is performed with the patient in reverse Trendelenburg.  +---------+---------------+---------+-----------+----------+--------------+ RIGHT    CompressibilityPhasicitySpontaneityPropertiesThrombus Aging +---------+---------------+---------+-----------+----------+--------------+ CFV      Full           Yes      Yes                                 +---------+---------------+---------+-----------+----------+--------------+ SFJ      Full                                                        +---------+---------------+---------+-----------+----------+--------------+ FV Prox  Full                                                        +---------+---------------+---------+-----------+----------+--------------+ FV Mid   Full                                                         +---------+---------------+---------+-----------+----------+--------------+ FV DistalFull                                                        +---------+---------------+---------+-----------+----------+--------------+ PFV      Full                                                        +---------+---------------+---------+-----------+----------+--------------+ POP      Full           Yes      Yes                                 +---------+---------------+---------+-----------+----------+--------------+  PTV      Full                                                        +---------+---------------+---------+-----------+----------+--------------+ PERO     Full                                                        +---------+---------------+---------+-----------+----------+--------------+   +---------+---------------+---------+-----------+----------+--------------+ LEFT     CompressibilityPhasicitySpontaneityPropertiesThrombus Aging +---------+---------------+---------+-----------+----------+--------------+ CFV      Full           Yes      Yes                                 +---------+---------------+---------+-----------+----------+--------------+ SFJ      Full                                                        +---------+---------------+---------+-----------+----------+--------------+ FV Prox  Full                                                        +---------+---------------+---------+-----------+----------+--------------+ FV Mid   Full                                                        +---------+---------------+---------+-----------+----------+--------------+ FV DistalFull                                                        +---------+---------------+---------+-----------+----------+--------------+ PFV      Full                                                         +---------+---------------+---------+-----------+----------+--------------+ POP      Full           Yes      Yes                                 +---------+---------------+---------+-----------+----------+--------------+ PTV      Full                                                        +---------+---------------+---------+-----------+----------+--------------+  PERO     Full                                                        +---------+---------------+---------+-----------+----------+--------------+     Summary: RIGHT: - There is no evidence of deep vein thrombosis in the lower extremity.  - No cystic structure found in the popliteal fossa.  LEFT: - There is no evidence of deep vein thrombosis in the lower extremity.  - No cystic structure found in the popliteal fossa.  *See table(s) above for measurements and observations. Electronically signed by Jamelle Haring on 01/08/2020 at 1:42:53 PM.    Final    Recent Labs    01/07/20 0537 01/08/20 0453  WBC 6.7 6.5  HGB 7.6* 7.5*  HCT 22.4* 22.2*  PLT 159 181   Recent Labs    01/07/20 0537  NA 137  K 3.9  CL 99  CO2 29  GLUCOSE 109*  BUN 14  CREATININE 0.73  CALCIUM 8.8*    Intake/Output Summary (Last 24 hours) at 01/09/2020 0826 Last data filed at 01/09/2020 0745 Gross per 24 hour  Intake 522 ml  Output 300 ml  Net 222 ml        Physical Exam: BP (!) 127/52 (BP Location: Right Arm)   Pulse 66   Temp 98.5 F (36.9 C)   Resp 18   Ht 5\' 4"  (1.626 m)   Wt 57.4 kg   SpO2 96%   BMI 21.72 kg/m  Constitutional: No distress . Vital signs reviewed. HEENT: EOMI, oral membranes moist Neck: supple Cardiovascular: RRR without murmur. No JVD    Respiratory/Chest: CTA Bilaterally without wheezes or rales. Normal effort    GI/Abdomen: BS +, non-tender, non-distended Ext: no clubbing, cyanosis, or edema Psych: pleasant and cooperative Skin: Warm and dry.  Right hip dressed, cdi Musc: Right hip with edema and mild  tenderness ongoing Neuro: Alert Motor:  Right lower extremity: Hip flexion, knee extension 1+/5 (pain inhibition), ankle dorsiflexion 4+/5, unchanged Left lower extremity: Hip flexion, knee extension 4-4+/5 (pain inhibition), ankle dorsiflexion 4+/5  Assessment/Plan: 1. Functional deficits which require 3+ hours per day of interdisciplinary therapy in a comprehensive inpatient rehab setting.  Physiatrist is providing close team supervision and 24 hour management of active medical problems listed below.  Physiatrist and rehab team continue to assess barriers to discharge/monitor patient progress toward functional and medical goals   Care Tool:  Bathing    Body parts bathed by patient: Right arm, Left arm, Chest, Abdomen, Front perineal area, Right upper leg, Left upper leg, Face   Body parts bathed by helper: Buttocks     Bathing assist Assist Level: Moderate Assistance - Patient 50 - 74%     Upper Body Dressing/Undressing Upper body dressing   What is the patient wearing?: Pull over shirt (night gown)    Upper body assist Assist Level: Set up assist    Lower Body Dressing/Undressing Lower body dressing      What is the patient wearing?: Underwear/pull up     Lower body assist Assist for lower body dressing: Maximal Assistance - Patient 25 - 49%     Toileting Toileting    Toileting assist Assist for toileting: Maximal Assistance - Patient 25 - 49%     Transfers Chair/bed transfer  Transfers assist  Chair/bed transfer assist level: Minimal Assistance - Patient > 75% Chair/bed transfer assistive device: Programmer, multimedia   Ambulation assist      Assist level: Moderate Assistance - Patient 50 - 74% Assistive device: Walker-rolling Max distance: 59ft   Walk 10 feet activity   Assist  Walk 10 feet activity did not occur: Safety/medical concerns (pain, generalized weakness, decreased balance)        Walk 50 feet  activity   Assist Walk 50 feet with 2 turns activity did not occur: Safety/medical concerns (pain, generalized weakness, decreased balance)         Walk 150 feet activity   Assist Walk 150 feet activity did not occur: Safety/medical concerns (pain, generalized weakness, decreased balance)         Walk 10 feet on uneven surface  activity   Assist Walk 10 feet on uneven surfaces activity did not occur: Safety/medical concerns (pain, generalized weakness, decreased balance)         Wheelchair     Assist Will patient use wheelchair at discharge?: Yes Type of Wheelchair: Manual    Wheelchair assist level: Supervision/Verbal cueing Max wheelchair distance: 136ft    Wheelchair 50 feet with 2 turns activity    Assist        Assist Level: Supervision/Verbal cueing   Wheelchair 150 feet activity     Assist      Assist Level: Supervision/Verbal cueing    Medical Problem List and Plan: 1.  Decreased functional ability secondary to right intertrochanteric hip fracture.  Status post open treatment intramedullary implant 01/04/2020.  Weightbearing as tolerated  Begin CIR evaluations  Trying to work thru pain 2.  Antithrombotics: -DVT/anticoagulation: Lovenox.               Venous Dopplers- no evidence of clots.              -antiplatelet therapy: N/A 3. Pain Management: Hydrocodone and Robaxin as needed             Monitor with increased exertion. Schedule Tylenol, add heating pad for low back pain.  4. Mood: Zoloft 50 mg daily             -antipsychotic agents: N/A 5. Neuropsych: This patient is capable of making decisions on her own behalf. 6. Skin/Wound Care: Routine skin checks 7. Fluids/Electrolytes/Nutrition: Routine in and outs 8.  Acute blood loss anemia.               Hemoglobin 7.6 on 12/2, 7.5 on 12/3  Observing only for now  -recheck hgb Monday  9.  Hypertension.  Toprolol 25 daily             Monitor with increased activity 10.   Drug-induced constipation.  Colace 100 mg twice daily, MiraLAX daily             Increase bowel meds as necessary 11.  Hyperglycemia-?  Stress-induced  Will order hemoglobin A1c with next set of labs 12.  Hypoalbuminemia  Supplement initiated on 12/2 13.  Sleep disturbance  Increased trazodone to 50mg  with benefit  LOS: 3 days A FACE TO FACE EVALUATION WAS PERFORMED  Meredith Staggers 01/09/2020, 8:26 AM

## 2020-01-10 ENCOUNTER — Inpatient Hospital Stay (HOSPITAL_COMMUNITY): Payer: Medicare Other

## 2020-01-10 DIAGNOSIS — E8809 Other disorders of plasma-protein metabolism, not elsewhere classified: Secondary | ICD-10-CM | POA: Diagnosis not present

## 2020-01-10 DIAGNOSIS — D62 Acute posthemorrhagic anemia: Secondary | ICD-10-CM | POA: Diagnosis not present

## 2020-01-10 DIAGNOSIS — R739 Hyperglycemia, unspecified: Secondary | ICD-10-CM | POA: Diagnosis not present

## 2020-01-10 DIAGNOSIS — S72141S Displaced intertrochanteric fracture of right femur, sequela: Secondary | ICD-10-CM | POA: Diagnosis not present

## 2020-01-10 NOTE — Progress Notes (Signed)
Occupational Therapy Session Note  Patient Details  Name: Alison Rivera MRN: [id number] Date of Birth: 31-Dec-1931  Today's Date: 01/10/2020 OT Individual Time: 3532-9924 OT Individual Time Calculation (min): 69 min    Short Term Goals: Week 1:  OT Short Term Goal 1 (Week 1): Pt will complete toilet transfer with CGA. OT Short Term Goal 2 (Week 1): Pt will complete toileting with CGA. OT Short Term Goal 3 (Week 1): Pt will complete LB dressing with min A.  Skilled Therapeutic Interventions/Progress Updates:    1:1. Pt received in recliner with daughter present agreeable to OT and reviewing AE. Pt daughter present to see demo. Pt able to recall how to doff socks with reacher with no VC. Pt requires cuing/demo of sock aide/shoe funnel use to improve independence with doning R sock and shoe. Edu re elastic laces for L shoe for improving independence with donning footwear. Plastic bag trick used for donning B teds total A. Pt shown TTB for in walk in shower as option to acccess shower if pt unable to step over shower ledge d/t pain and weakness in R hip. Pt completes 3x30 ball toss (chest, bounce, overhead pass) in seated position with 1.5 # wrist weights to improve BUE coordination/strengthening required for BADLs/functional transfers. Pt given prolonged rest breaks d/t decreased endurance. Exited session with pt seated in recliner, exit alarm on and call light in reach    Therapy Documentation Precautions:  Precautions Precautions: Fall Restrictions Weight Bearing Restrictions: Yes RLE Weight Bearing: Weight bearing as tolerated General:   Vital Signs: Therapy Vitals Temp: 98.7 F (37.1 C) Pulse Rate: 63 Resp: 18 BP: (!) 118/43 Patient Position (if appropriate): Lying Oxygen Therapy SpO2: 96 % O2 Device: Room Air Pain: Pain Assessment Pain Scale: 0-10 Pain Score: 10-Worst pain ever Pain Type: Surgical pain Pain Location: Leg Pain Orientation: Right Pain Descriptors /  Indicators: Sharp Pain Frequency: Intermittent Pain Onset: Progressive Patients Stated Pain Goal: 2 Pain Intervention(s): Medication (See eMAR);Repositioned;Relaxation;Rest ADL: ADL Eating: Independent Grooming: Setup Upper Body Bathing: Setup Lower Body Bathing: Moderate assistance Upper Body Dressing: Minimal assistance Lower Body Dressing: Maximal assistance Toileting: Maximal assistance Toilet Transfer: Moderate assistance Toilet Transfer Method: Stand pivot Vision   Perception    Praxis   Exercises:   Other Treatments:     Therapy/Group: Individual Therapy  Tonny Branch 01/10/2020, 7:19 AM

## 2020-01-10 NOTE — Progress Notes (Signed)
[place] PHYSICAL MEDICINE & REHABILITATION PROGRESS NOTE  Subjective/Complaints: Right hip sore with radiation to her right knee last night  ROS: Patient denies fever, rash, sore throat, blurred vision, nausea, vomiting, diarrhea, cough, shortness of breath or chest pain, back pain, headache, or mood change.    Objective: Vital Signs: Blood pressure (!) 118/43, pulse 63, temperature 98.7 F (37.1 C), resp. rate 18, height 5\' 4"  (1.626 m), weight 57.4 kg, SpO2 96 %. No results found. Recent Labs    01/08/20 0453  WBC 6.5  HGB 7.5*  HCT 22.2*  PLT 181   No results for input(s): NA, K, CL, CO2, GLUCOSE, BUN, CREATININE, CALCIUM in the last 72 hours.  Intake/Output Summary (Last 24 hours) at 01/10/2020 0847 Last data filed at 01/10/2020 0830 Gross per 24 hour  Intake 1120 ml  Output --  Net 1120 ml        Physical Exam: BP (!) 118/43 (BP Location: Right Arm)   Pulse 63   Temp 98.7 F (37.1 C)   Resp 18   Ht 5\' 4"  (1.626 m)   Wt 57.4 kg   SpO2 96%   BMI 21.72 kg/m  Constitutional: No distress . Vital signs reviewed. HEENT: EOMI, oral membranes moist Neck: supple Cardiovascular: RRR without murmur. No JVD    Respiratory/Chest: CTA Bilaterally without wheezes or rales. Normal effort    GI/Abdomen: BS +, non-tender, non-distended Ext: no clubbing, cyanosis, or edema Psych: pleasant and cooperative Skin: right hip wounds cdi with staples Musc: Right hip with mild edema and  tenderness ongoing, right knee looks fine Neuro: Alert Motor:  Right lower extremity: Hip flexion, knee extension 1+/5 (pain inhibition), ankle dorsiflexion 4+/5, unchanged Left lower extremity: Hip flexion, knee extension 4-4+/5 (pain inhibition), ankle dorsiflexion 4+/5  Assessment/Plan: 1. Functional deficits which require 3+ hours per day of interdisciplinary therapy in a comprehensive inpatient rehab setting.  Physiatrist is providing close team supervision and 24 hour management of  active medical problems listed below.  Physiatrist and rehab team continue to assess barriers to discharge/monitor patient progress toward functional and medical goals   Care Tool:  Bathing    Body parts bathed by patient: Right arm, Left arm, Chest, Abdomen, Front perineal area, Right upper leg, Left upper leg, Face   Body parts bathed by helper: Buttocks     Bathing assist Assist Level: Moderate Assistance - Patient 50 - 74%     Upper Body Dressing/Undressing Upper body dressing   What is the patient wearing?: Pull over shirt (night gown)    Upper body assist Assist Level: Set up assist    Lower Body Dressing/Undressing Lower body dressing      What is the patient wearing?: Underwear/pull up     Lower body assist Assist for lower body dressing: Maximal Assistance - Patient 25 - 49%     Toileting Toileting    Toileting assist Assist for toileting: Maximal Assistance - Patient 25 - 49%     Transfers Chair/bed transfer  Transfers assist     Chair/bed transfer assist level: Contact Guard/Touching assist Chair/bed transfer assistive device: Programmer, multimedia   Ambulation assist      Assist level: Moderate Assistance - Patient 50 - 74% Assistive device: Walker-rolling Max distance: 55ft   Walk 10 feet activity   Assist  Walk 10 feet activity did not occur: Safety/medical concerns (pain, generalized weakness, decreased balance)        Walk 50 feet activity   Assist Walk  50 feet with 2 turns activity did not occur: Safety/medical concerns (pain, generalized weakness, decreased balance)         Walk 150 feet activity   Assist Walk 150 feet activity did not occur: Safety/medical concerns (pain, generalized weakness, decreased balance)         Walk 10 feet on uneven surface  activity   Assist Walk 10 feet on uneven surfaces activity did not occur: Safety/medical concerns (pain, generalized weakness, decreased balance)          Wheelchair     Assist Will patient use wheelchair at discharge?: Yes Type of Wheelchair: Manual    Wheelchair assist level: Supervision/Verbal cueing Max wheelchair distance: 127ft    Wheelchair 50 feet with 2 turns activity    Assist        Assist Level: Supervision/Verbal cueing   Wheelchair 150 feet activity     Assist      Assist Level: Supervision/Verbal cueing    Medical Problem List and Plan: 1.  Decreased functional ability secondary to right intertrochanteric hip fracture.  Status post open treatment intramedullary implant 01/04/2020.  Weightbearing as tolerated  Begin CIR evaluations  Pt working through pain 2.  Antithrombotics: -DVT/anticoagulation: Lovenox.               Venous Dopplers- no evidence of clots.              -antiplatelet therapy: N/A 3. Pain Management: Hydrocodone and Robaxin as needed             Monitor with increased exertion. Schedule Tylenol, add heating pad for low back pain.   -12/5 may be having intermittent spasms, encouraged use of heat/ice/robaxin if she experiences these sudden shooting pains 4. Mood: Zoloft 50 mg daily             -antipsychotic agents: N/A 5. Neuropsych: This patient is capable of making decisions on her own behalf. 6. Skin/Wound Care: continue staples/foam dressing for now 7. Fluids/Electrolytes/Nutrition: Routine in and outs 8.  Acute blood loss anemia.               Hemoglobin 7.6 on 12/2, 7.5 on 12/3  Observing only for now  -recheck hgb Monday  9.  Hypertension.  Toprolol 25 daily             Monitor with increased activity 10.  Drug-induced constipation.  Colace 100 mg twice daily, MiraLAX daily             Increase bowel meds as necessary 11.  Hyperglycemia-?  Stress-induced    12.  Hypoalbuminemia  Supplement initiated on 12/2 13.  Sleep disturbance  Increased trazodone to 50mg  with benefit  LOS: 4 days A FACE TO FACE EVALUATION WAS PERFORMED  Meredith Staggers 01/10/2020,  8:47 AM

## 2020-01-10 NOTE — Plan of Care (Signed)
  Problem: Consults Goal: RH GENERAL PATIENT EDUCATION Description: See Patient Education module for education specifics. Outcome: Progressing   Problem: RH BOWEL ELIMINATION Goal: RH STG MANAGE BOWEL WITH ASSISTANCE Description: STG Manage Bowel with min Assistance. Outcome: Progressing   Problem: RH BLADDER ELIMINATION Goal: RH STG MANAGE BLADDER WITH ASSISTANCE Description: STG Manage Bladder With Min Assistance Outcome: Progressing   Problem: RH SKIN INTEGRITY Goal: RH STG ABLE TO PERFORM INCISION/WOUND CARE W/ASSISTANCE Description: STG Able To Perform Incision/Wound Care With [employer]. Outcome: Progressing   Problem: RH PAIN MANAGEMENT Goal: RH STG PAIN MANAGED AT OR BELOW PT'S PAIN GOAL Description: <4 out of 10.  Outcome: Progressing   Problem: RH KNOWLEDGE DEFICIT GENERAL Goal: RH STG INCREASE KNOWLEDGE OF SELF CARE AFTER HOSPITALIZATION Description: Pt and family will verbalize understanding of self care including medication management, incision care, pain management, and follow up care at the time of discharge with min cues using provided educational materials.  Outcome: Progressing

## 2020-01-11 ENCOUNTER — Inpatient Hospital Stay (HOSPITAL_COMMUNITY): Payer: Medicare Other

## 2020-01-11 ENCOUNTER — Inpatient Hospital Stay (HOSPITAL_COMMUNITY): Payer: Medicare Other | Admitting: Occupational Therapy

## 2020-01-11 DIAGNOSIS — S72141S Displaced intertrochanteric fracture of right femur, sequela: Secondary | ICD-10-CM | POA: Diagnosis not present

## 2020-01-11 DIAGNOSIS — D62 Acute posthemorrhagic anemia: Secondary | ICD-10-CM | POA: Diagnosis not present

## 2020-01-11 DIAGNOSIS — R739 Hyperglycemia, unspecified: Secondary | ICD-10-CM | POA: Diagnosis not present

## 2020-01-11 DIAGNOSIS — G479 Sleep disorder, unspecified: Secondary | ICD-10-CM | POA: Diagnosis not present

## 2020-01-11 LAB — CBC
HCT: 23.4 % — ABNORMAL LOW (ref 36.0–46.0)
Hemoglobin: 7.9 g/dL — ABNORMAL LOW (ref 12.0–15.0)
MCH: 32 pg (ref 26.0–34.0)
MCHC: 33.8 g/dL (ref 30.0–36.0)
MCV: 94.7 fL (ref 80.0–100.0)
Platelets: 296 10*3/uL (ref 150–400)
RBC: 2.47 MIL/uL — ABNORMAL LOW (ref 3.87–5.11)
RDW: 14.7 % (ref 11.5–15.5)
WBC: 6 10*3/uL (ref 4.0–10.5)
nRBC: 0 % (ref 0.0–0.2)

## 2020-01-11 LAB — HEMOGLOBIN A1C
Hgb A1c MFr Bld: 5.2 % (ref 4.8–5.6)
Mean Plasma Glucose: 102.54 mg/dL

## 2020-01-11 NOTE — Progress Notes (Signed)
Physical Therapy Session Note  Patient Details  Name: Alison Rivera MRN: [id number] Date of Birth: 11-27-1931  Today's Date: 01/11/2020 PT Individual Time: 1100-1157 PT Individual Time Calculation (min): 57 min   Short Term Goals: Week 1:  PT Short Term Goal 1 (Week 1): pt will perform bed mobility with min A overall PT Short Term Goal 2 (Week 1): Pt will transfer bed<>WC with LRAD and CGA PT Short Term Goal 3 (Week 1): Pt will ambulate 80ft with LRAD and CGA  Skilled Therapeutic Interventions/Progress Updates:   Received pt sitting in recliner with daughter and husband present at bedside, pt agreeable to therapy, and reported pain 0/10 at rest increasing to 8/10 pain in RLE with mobility (premedicated). Repositioning, rest breaks, and distraction done to reduce pain levels. Session with emphasis on functional mobility/transfers, generalized strengthening, dynamic standing balance/coordination, ambulation, and improved activity tolerance. Noted edema in RLE and donned bilateral ted hose and shoes with max A for time management purposes. Pt transferred recliner<>WC stand<>pivot with RW and CGA with cues for hand placement on armrests when standing. Pt transported to therapy gym in [facility] total A for time management purposes and attempted stair navigation. Demonstrated lateral stepping technique as pt's husband reported he can try to get 1 railing installed. Pt unable to turn completely to the R and unable to shift weight to RLE to lift LLE onto step; returned to sitting due to pain. Pt transferred sit<>stand with RW and CGA and worked on R lateral weight shifting 2x10 reps with 2 UE support fading to 1 UE support. Pt grimacing when shifting weight to R and demonstrated decreased weight shift R >L. Pt performed alternating marches x10 with continued emphasis on R weight shifting, however pt with heavy reliance on RW for UE support with CGA for balance. Discussion hard regarding installing a ramp due to high  pain levels and difficulty putting weight on RLE; pt verbalized understanding and CSW and family aware. Pt ambulated 46ft x 1 and 54ft x 1 with RW and CGA/min A. Pt demonstrates antalgic gait pattern, flexed trunk, and decreased weight shifting to R side due to high pain levels. Pt performed the following exercises sitting on mat with cues for technique: -RLE LAQ 2x8 -RLE hip flexion 2x10 (AAROM) -hip adduction ball squeezes 2x10 -hip abduction isometrics using gait belt 2x8 with 2-3 second isometric hold  Pt transferred mat<>WC with RW and CGA and transported back to room in [facility] total A. Pt ambulated additional 34ft with RW and CGA back to recliner. Concluded session with pt sitting in recliner, needs within reach, and seatbelt alarm on.   Therapy Documentation Precautions:  Precautions Precautions: Fall Restrictions Weight Bearing Restrictions: Yes RLE Weight Bearing: Weight bearing as tolerated  Therapy/Group: Individual Therapy [name] PT, DPT   01/11/2020, 7:35 AM

## 2020-01-11 NOTE — Progress Notes (Signed)
[facility] PHYSICAL MEDICINE & REHABILITATION PROGRESS NOTE  Subjective/Complaints: Patient seen sitting up in her chair this morning.  She states she did not sleep well overnight due to muscle spasms in her leg.  ROS: Denies CP, SOB, N/V/D  Objective: Vital Signs: Blood pressure (!) 119/58, pulse 69, temperature 98.8 F (37.1 C), temperature source Oral, resp. rate 16, height 5\' 4"  (1.626 m), weight 57.4 kg, SpO2 98 %. No results found. Recent Labs    01/11/20 0550  WBC 6.0  HGB 7.9*  HCT 23.4*  PLT 296   No results for input(s): NA, K, CL, CO2, GLUCOSE, BUN, CREATININE, CALCIUM in the last 72 hours.  Intake/Output Summary (Last 24 hours) at 01/11/2020 0902 Last data filed at 01/11/2020 0756 Gross per 24 hour  Intake 1400 ml  Output --  Net 1400 ml        Physical Exam: BP (!) 119/58 (BP Location: Right Arm)   Pulse 69   Temp 98.8 F (37.1 C) (Oral)   Resp 16   Ht 5\' 4"  (1.626 m)   Wt 57.4 kg   SpO2 98%   BMI 21.72 kg/m  Constitutional: No distress . Vital signs reviewed. HENT: Normocephalic.  Atraumatic. Eyes: EOMI. No discharge. Cardiovascular: No JVD.  RRR. Respiratory: Normal effort.  No stridor.  Bilateral clear to auscultation. GI: Non-distended.  BS +. Skin: Warm and dry.  Right hip with dressing CDI Psych: Normal mood.  Normal behavior. Musc: Right hip with mild edema and  tenderness, stable Neuro: Alert Motor:  Right lower extremity: Hip flexion 1+/5 (pain inhibition), knee extension 4-/5 (pain inhibition), ankle dorsiflexion 4+/5 Left lower extremity: Hip flexion, knee extension 4-4+/5 (pain inhibition), ankle dorsiflexion 4+/5  Assessment/Plan: 1. Functional deficits which require 3+ hours per day of interdisciplinary therapy in a comprehensive inpatient rehab setting.  Physiatrist is providing close team supervision and 24 hour management of active medical problems listed below.  Physiatrist and rehab team continue to assess barriers to  discharge/monitor patient progress toward functional and medical goals   Care Tool:  Bathing    Body parts bathed by patient: Right arm, Left arm, Chest, Abdomen, Front perineal area, Right upper leg, Left upper leg, Face   Body parts bathed by helper: Buttocks     Bathing assist Assist Level: Moderate Assistance - Patient 50 - 74%     Upper Body Dressing/Undressing Upper body dressing   What is the patient wearing?: Pull over shirt (night gown)    Upper body assist Assist Level: Set up assist    Lower Body Dressing/Undressing Lower body dressing      What is the patient wearing?: Underwear/pull up     Lower body assist Assist for lower body dressing: Maximal Assistance - Patient 25 - 49%     Toileting Toileting    Toileting assist Assist for toileting: Maximal Assistance - Patient 25 - 49%     Transfers Chair/bed transfer  Transfers assist     Chair/bed transfer assist level: Contact Guard/Touching assist Chair/bed transfer assistive device: Programmer, multimedia   Ambulation assist      Assist level: Moderate Assistance - Patient 50 - 74% Assistive device: Walker-rolling Max distance: 101ft   Walk 10 feet activity   Assist  Walk 10 feet activity did not occur: Safety/medical concerns (pain, generalized weakness, decreased balance)        Walk 50 feet activity   Assist Walk 50 feet with 2 turns activity did not occur: Safety/medical concerns (  pain, generalized weakness, decreased balance)         Walk 150 feet activity   Assist Walk 150 feet activity did not occur: Safety/medical concerns (pain, generalized weakness, decreased balance)         Walk 10 feet on uneven surface  activity   Assist Walk 10 feet on uneven surfaces activity did not occur: Safety/medical concerns (pain, generalized weakness, decreased balance)         Wheelchair     Assist Will patient use wheelchair at discharge?: Yes Type of  Wheelchair: Manual    Wheelchair assist level: Supervision/Verbal cueing Max wheelchair distance: 186ft    Wheelchair 50 feet with 2 turns activity    Assist        Assist Level: Supervision/Verbal cueing   Wheelchair 150 feet activity     Assist      Assist Level: Supervision/Verbal cueing    Medical Problem List and Plan: 1.  Decreased functional ability secondary to right intertrochanteric hip fracture.  Status post open treatment intramedullary implant 01/04/2020.  Weightbearing as tolerated  Continue CIR 2.  Antithrombotics: -DVT/anticoagulation: Lovenox.               Venous Dopplers- no evidence of clots.              -antiplatelet therapy: N/A 3. Pain Management: Hydrocodone and Robaxin as needed             Scheduled Tylenol, add heating pad for low back pain.   Relatively controlled with meds on 12/6 4. Mood: Zoloft 50 mg daily             -antipsychotic agents: N/A 5. Neuropsych: This patient is capable of making decisions on her own behalf. 6. Skin/Wound Care: continue staples/foam dressing for now 7. Fluids/Electrolytes/Nutrition: Routine in and outs 8.  Acute blood loss anemia.               Hemoglobin 7.9 on 12/6 9.  Hypertension.  Toprolol 25 daily  Controlled on 12/6             Monitor with increased activity 10.  Drug-induced constipation.  Colace 100 mg twice daily, MiraLAX daily             Increase bowel meds as necessary 11.  Hyperglycemia-?  Stress-induced  Hemoglobin A1c pending 12.  Hypoalbuminemia  Supplement initiated on 12/2 13.  Sleep disturbance  Increased trazodone to 50mg   Improving  LOS: 5 days A FACE TO FACE EVALUATION WAS PERFORMED  Julia Kulzer Lorie Phenix 01/11/2020, 9:02 AM

## 2020-01-11 NOTE — Progress Notes (Signed)
Physical Therapy Session Note  Patient Details  Name: Alison Rivera MRN: [id number] Date of Birth: [birth year]  Today's Date: 01/11/2020 PT Individual Time: 0900-0957 PT Individual Time Calculation (min): 57 min   Short Term Goals: Week 1:  PT Short Term Goal 1 (Week 1): pt will perform bed mobility with min A overall PT Short Term Goal 2 (Week 1): Pt will transfer bed<>WC with LRAD and CGA PT Short Term Goal 3 (Week 1): Pt will ambulate 8ft with LRAD and CGA  Skilled Therapeutic Interventions/Progress Updates:    Pt greeted in w/c in the bathroom with NT supervision, handoff of care to PT. Pt agreeable to PT session but reports "25/10" on the pain scale for her RLE. She reports she hasn't gotten pain medications as she requested prior to therapist's arrival. RN was notified who provided pain medications. Pt performed lower body dressing in sitting, requiring minA for threading RLE. She was able to pull over hips in standing with CGA for balance. She completed upper body dressing with setupA including donning a t-shirt and zip-up jacket. W/c transport to main therapy gym for energy conservation and performed stand<>pivot with CGA and RW from w/c to mat table. She completed a few exercises seated EOM for improving activity tolerance and preparing her for functional mobility training. She completed RLE LAQ and (self AAROM) R hip flexion, pain limiting. She then completed repeated sit<>stands from lowered mat table height, with CGA to RW. She then ambulated 3x58ft with minA and RW, demo's step-to gait pattern, antalgic and limited RLE weight bearing but appears to be improved from prior sessions. Required extended rest breaks b/w trials due to RLE pain > fatigue. She then completed a game of horseshoes in standing, focusing on standing tolerance and dynamic reaching outside BOS. 1st game she completed with LUE support and CGA while tossing horseshoes with RUE. 2nd game she completed without LUE support  and CGA while tossing with RUE. She enjoyed this and seemed to assist with distractions for RLE pain. Pt was transported back to her room with totalA in w/c and performed stand<>pivot with CGA and RW from w/c to recliner. She was positioned comfortably with BLE elevated and needs in reach with safety belt alarm on.  Therapy Documentation Precautions:  Precautions Precautions: Fall Restrictions Weight Bearing Restrictions: Yes RLE Weight Bearing: Weight bearing as tolerated  Therapy/Group: Individual Therapy  Raenette Sakata P Shira Bobst PT 01/11/2020, 7:45 AM

## 2020-01-11 NOTE — Progress Notes (Signed)
Occupational Therapy Session Note  Patient Details  Name: Alison Rivera MRN: [id number] Date of Birth: 19-Jan-1932  Today's Date: 01/11/2020 OT Individual Time: 9528-4132 OT Individual Time Calculation (min): 68 min    Short Term Goals: Week 1:  OT Short Term Goal 1 (Week 1): Pt will complete toilet transfer with CGA. OT Short Term Goal 2 (Week 1): Pt will complete toileting with CGA. OT Short Term Goal 3 (Week 1): Pt will complete LB dressing with min A.  Skilled Therapeutic Interventions/Progress Updates:    Treatment session with focus on self-care retraining, dynamic standing balance, and functional transfers.  Pt received upright in recliner with husband present during session.  Pt agreeable to shower this min encouragement as pt initially reporting pain.  Pt ambulated to room shower ~15' with RW with min assist, pt demonstrating increased pain in Rt hip with mobility.  Pt required assistance to doff pants, TEDS, and shoes this session.  Engaged in bathing with overall supervision, except min assist to wash buttocks while pt maintained standing balance.  Pt completed all sit <> stands with CGA throughout session.  Pt donned nightgown with setup.  Ambulated 10' to w/c with RW with min assist.  Engaged in drying hair in sitting at sink with setup for items.  Discussed bathroom setup, husband provided pictures of walk-in shower with small built in seat and pictures of toilet.  Pt may benefit from shower chair in shower, however it is a very small shower.  Pt returned to recliner at end of session min assist stand pivot with RW.  Pt remained in recliner with legs elevated and seat belt alarm on.  Therapy Documentation Precautions:  Precautions Precautions: Fall Restrictions Weight Bearing Restrictions: Yes RLE Weight Bearing: Weight bearing as tolerated Pain:  Pt with c/o pain 8/10 in Rt hip with mobility, 0/10 at rest.   Therapy/Group: Individual Therapy  Simonne Come 01/11/2020, 3:14  PM

## 2020-01-11 NOTE — Progress Notes (Signed)
Patient ID: Alison Rivera, female   DOB: December 14, 1931, 86 y.o.   MRN: [id number]  Met with pt, daughter-Katherine and pt's husband to discuss how she is doing and what she will need at discharge. Anna-PT in room and asked about steps into home. Recommended a rail and possibility of needing a ramp for the two steps into the home. Daughter to look to see if can borrow a wheelchair since insurance will only cover a wheelchair or walker. Aware team conference on Wed will know target discharge date and needs. Husband wanted to be clear he is the one to be speaking with and not his daughter. Discussed both of their input is important and here was busy looking at his phone while worker was in the room. Husband was planning on contacting his friend to place a rail on the steps this week. Continue to work on discharge plans. Encouraged both daughter's to assist Dad with pt's care at home.

## 2020-01-12 ENCOUNTER — Inpatient Hospital Stay (HOSPITAL_COMMUNITY): Payer: Medicare Other

## 2020-01-12 ENCOUNTER — Inpatient Hospital Stay (HOSPITAL_COMMUNITY): Payer: Medicare Other | Admitting: Occupational Therapy

## 2020-01-12 DIAGNOSIS — R739 Hyperglycemia, unspecified: Secondary | ICD-10-CM | POA: Diagnosis not present

## 2020-01-12 DIAGNOSIS — G479 Sleep disorder, unspecified: Secondary | ICD-10-CM | POA: Diagnosis not present

## 2020-01-12 DIAGNOSIS — S72141S Displaced intertrochanteric fracture of right femur, sequela: Secondary | ICD-10-CM | POA: Diagnosis not present

## 2020-01-12 DIAGNOSIS — I10 Essential (primary) hypertension: Secondary | ICD-10-CM | POA: Diagnosis not present

## 2020-01-12 DIAGNOSIS — R0989 Other specified symptoms and signs involving the circulatory and respiratory systems: Secondary | ICD-10-CM

## 2020-01-12 MED ORDER — ENOXAPARIN (LOVENOX) PATIENT EDUCATION KIT
PACK | Freq: Once | Status: DC
  Filled 2020-01-12 (×2): qty 1

## 2020-01-12 MED ORDER — ZOLPIDEM TARTRATE 5 MG PO TABS
5.0000 mg | ORAL_TABLET | Freq: Every evening | ORAL | Status: DC | PRN
  Administered 2020-01-12 – 2020-01-20 (×7): 5 mg via ORAL
  Filled 2020-01-12 (×8): qty 1

## 2020-01-12 NOTE — Progress Notes (Signed)
[facility] PHYSICAL MEDICINE & REHABILITATION PROGRESS NOTE  Subjective/Complaints: Patient seen sitting up in her chair this morning working with therapies.  She states she did not sleep well overnight.  She states she takes Ambien at home which works for her.  Discussed cautious use of medication with patient.  ROS: Denies CP, SOB, N/V/D  Objective: Vital Signs: Blood pressure (!) 151/56, pulse 66, temperature 98.7 F (37.1 C), resp. rate 15, height 5\' 4"  (1.626 m), weight 57.4 kg, SpO2 100 %. No results found. Recent Labs    01/11/20 0550  WBC 6.0  HGB 7.9*  HCT 23.4*  PLT 296   No results for input(s): NA, K, CL, CO2, GLUCOSE, BUN, CREATININE, CALCIUM in the last 72 hours.  Intake/Output Summary (Last 24 hours) at 01/12/2020 0903 Last data filed at 01/12/2020 0747 Gross per 24 hour  Intake 840 ml  Output --  Net 840 ml        Physical Exam: BP (!) 151/56 (BP Location: Right Arm)   Pulse 66   Temp 98.7 F (37.1 C)   Resp 15   Ht 5\' 4"  (1.626 m)   Wt 57.4 kg   SpO2 100%   BMI 21.72 kg/m  Constitutional: No distress . Vital signs reviewed. HENT: Normocephalic.  Atraumatic. Eyes: EOMI. No discharge. Cardiovascular: No JVD.  RRR. Respiratory: Normal effort.  No stridor.  Bilateral clear to auscultation. GI: Non-distended.  BS +. Skin: Warm and dry.  Right hip dressing CDI. Psych: Normal mood.  Normal behavior. Musc: Right hip with edema, mild tenderness Neuro: Alert Motor:  Right lower extremity: Hip flexion 1+/5 (pain inhibition), knee extension 4-/5 (pain inhibition), ankle dorsiflexion 4+/5, unchanged Left lower extremity: Hip flexion, knee extension 4-4+/5 (pain inhibition), ankle dorsiflexion 4+/5  Assessment/Plan: 1. Functional deficits which require 3+ hours per day of interdisciplinary therapy in a comprehensive inpatient rehab setting.  Physiatrist is providing close team supervision and 24 hour management of active medical problems listed  below.  Physiatrist and rehab team continue to assess barriers to discharge/monitor patient progress toward functional and medical goals   Care Tool:  Bathing    Body parts bathed by patient: Right arm, Left arm, Chest, Abdomen, Front perineal area, Right upper leg, Left upper leg, Face, Left lower leg   Body parts bathed by helper: Buttocks     Bathing assist Assist Level: Minimal Assistance - Patient > 75%     Upper Body Dressing/Undressing Upper body dressing   What is the patient wearing?: Pull over shirt    Upper body assist Assist Level: Set up assist    Lower Body Dressing/Undressing Lower body dressing      What is the patient wearing?: Pants     Lower body assist Assist for lower body dressing: Minimal Assistance - Patient > 75%     Toileting Toileting    Toileting assist Assist for toileting: Maximal Assistance - Patient 25 - 49%     Transfers Chair/bed transfer  Transfers assist     Chair/bed transfer assist level: Contact Guard/Touching assist Chair/bed transfer assistive device: Programmer, multimedia   Ambulation assist      Assist level: Minimal Assistance - Patient > 75% Assistive device: Walker-rolling Max distance: 69ft   Walk 10 feet activity   Assist  Walk 10 feet activity did not occur: Safety/medical concerns (pain, generalized weakness, decreased balance)  Assist level: Minimal Assistance - Patient > 75% Assistive device: Walker-rolling   Walk 50 feet activity  Assist Walk 50 feet with 2 turns activity did not occur: Safety/medical concerns         Walk 150 feet activity   Assist Walk 150 feet activity did not occur: Safety/medical concerns         Walk 10 feet on uneven surface  activity   Assist Walk 10 feet on uneven surfaces activity did not occur: Safety/medical concerns         Wheelchair     Assist Will patient use wheelchair at discharge?: Yes Type of Wheelchair: Manual     Wheelchair assist level: Supervision/Verbal cueing Max wheelchair distance: 118ft    Wheelchair 50 feet with 2 turns activity    Assist        Assist Level: Supervision/Verbal cueing   Wheelchair 150 feet activity     Assist      Assist Level: Supervision/Verbal cueing    Medical Problem List and Plan: 1.  Decreased functional ability secondary to right intertrochanteric hip fracture.  Status post open treatment intramedullary implant 01/04/2020.  Weightbearing as tolerated  Continue CIR 2.  Antithrombotics: -DVT/anticoagulation: Lovenox.               Venous Dopplers- no evidence of clots.              -antiplatelet therapy: N/A 3. Pain Management: Hydrocodone and Robaxin as needed             Scheduled Tylenol, add heating pad for low back pain.   Relatively controlled with meds on 12/7 4. Mood: Zoloft 50 mg daily             -antipsychotic agents: N/A 5. Neuropsych: This patient is capable of making decisions on her own behalf. 6. Skin/Wound Care: continue staples/foam dressing for now 7. Fluids/Electrolytes/Nutrition: Routine in and outs 8.  Acute blood loss anemia.               Hemoglobin 7.9 on 12/6 9.  Hypertension.  Toprolol 25 daily  Labile on 12/7, monitor for trend             Monitor with increased activity 10.  Drug-induced constipation.  Colace 100 mg twice daily, MiraLAX daily             Increase bowel meds as necessary 11.  Hyperglycemia-?  Stress-induced  Hemoglobin A1c 5.2 on 12/6 12.  Hypoalbuminemia  Supplement initiated on 12/2 13.  Sleep disturbance  Increased trazodone to 50mg , DC'd  Ambien 5 mg PTA, resumed-educated patient on safety  LOS: 6 days A FACE TO FACE EVALUATION WAS PERFORMED  Johnathyn Viscomi Lorie Phenix 01/12/2020, 9:03 AM

## 2020-01-12 NOTE — Progress Notes (Signed)
Occupational Therapy Session Note  Patient Details  Name: Alison Rivera MRN: [id number] Date of Birth: November 23, 1931  Today's Date: 01/12/2020 OT Individual Time: 1100-1155 OT Individual Time Calculation (min): 55 min    Short Term Goals: Week 1:  OT Short Term Goal 1 (Week 1): Pt will complete toilet transfer with CGA. OT Short Term Goal 2 (Week 1): Pt will complete toileting with CGA. OT Short Term Goal 3 (Week 1): Pt will complete LB dressing with min A.  Skilled Therapeutic Interventions/Progress Updates:    Treatment session with focus on functional transfers, sit <> stand, and increasing tolerance of weight bearing through RLE.  Pt received upright in recliner reporting need to toilet.  Pt ambulated to toilet with RW with min assist and increased cues for WB through RLE during ambulation.  Pt completed toileting tasks with close supervision.  Pt ambulated out of bathroom to w/c with min assist.  Pt completed oral care and washing hands seated at sink.  Engaged in side stepping outside parallel bars to focus on weight shifting and WB through RLE and engaged in alternating step ups on/off step of parallel bars.  Pt required min assist due to pain and "grunting" when WB through RLE.  Engaged in table top task in standing with focus on increased weight bearing through RLE, incorporating weight shifting to increase WB through RLE.  Pt returned to room and transferred stand pivot to recliner with CGA.  Pt remained with legs elevated, seat belt alarm on, and all needs in reach.  Therapy Documentation Precautions:  Precautions Precautions: Fall Restrictions Weight Bearing Restrictions: Yes RLE Weight Bearing: Non weight bearing Pain: Pain Assessment Pain Scale: 0-10 Pain Score: 5  Faces Pain Scale: Hurts little more Pain Type: Surgical pain Pain Location: Leg Pain Orientation: Right Pain Descriptors / Indicators: Aching Pain Onset: With Activity Patients Stated Pain Goal: 2    Therapy/Group: Individual Therapy  Simonne Come 01/12/2020, 12:11 PM

## 2020-01-12 NOTE — Progress Notes (Signed)
Physical Therapy Session Note  Patient Details  Name: Alison Rivera MRN: [id number] Date of Birth: March 05, 1931  Today's Date: 01/12/2020 PT Individual Time: 0800-0910 and 1300-1410  PT Individual Time Calculation (min): 70 min and 70 min  Short Term Goals: Week 1:  PT Short Term Goal 1 (Week 1): pt will perform bed mobility with min A overall PT Short Term Goal 2 (Week 1): Pt will transfer bed<>WC with LRAD and CGA PT Short Term Goal 3 (Week 1): Pt will ambulate 22ft with LRAD and CGA  Skilled Therapeutic Interventions/Progress Updates:   Treatment Session 1: 0800-0910 70 min Received pt sitting in recliner with RN present administering pain medications, pt agreeable to therapy, and reported pain 10/10 in R hip (premedicated) and c/o not sleeping at all last night due to pain, muscle spasms, and being uncomfortable in bed. Repositioning, rest breaks, and distraction done to reduce pain levels. Session with emphasis on functional mobility/transfers, generalized strengthening, dynamic standing balance/coordination, ambulation, and improved activity tolerance. Noted pt with improvements in RLE edema. Donned bilateral ted hose and shoes with total A and pt doffed nightgown and donned clean pull over shirt with supervision. Pt transferred sit<>stand with RW and CGA x 2 trials to doff nightgown and pull underwear/pants over hips with CGA. Pt did require mod A to thread RLE through underwear and pants while sitting in recliner. Donned jacket standing with min A and pt transferred stand<>pivot recliner<>WC with RW and CGA. MD present for morning rounds and to discuss pt's difficulty sleeping. Pt transported to ortho gym in [facility] total A for time management purposes and transferred stand<>pivot WC<>Nustep with RW and CGA. Pt performed bilateral UE/LE strengthening on Nustep at workload 3 for 10 minutes for a total of 198 steps with 1 rest break for improved cardiovascular endurance and with emphasis on R him ROM.  Pt required mod A for RLE placement on Nustep footplate due to pain and weakness. Pt ambulated 40ft with RW and CGA to mat. Pt continues to demonstrate antalgic gait with decreased weight shifting to R, flexed trunk, and decreased step length. Pt transferred sit<>supine on wedge with mod A for RLE management. Pt performed the following exercises supine on wedge with increased time due to pain and cues for technique: -RLE heel slides 2x8 (AAROM) - cues for pursed lip breathing due to high pain levels -bridges 2x8 with emphasis on equal weight distribution through LEs -alternating hip flexion 2x10 AAROM on RLE -hip adduction ball squeezes 2x10 Supine<>sitting EOB with mod A for RLE management with cues for technique. Pt ambulated additional 58ft with RW and CGA demonstrating same gait pattern listed above then requested to sit stating "alright, I've had it". Pt transported back to room in [facility] total A and transferred WC<>recliner stand<>pivot with RW and CGA. Concluded session with pt sitting in recliner, needs within reach, and seatbelt alarm on.   Treatment Session 2: 1300-1410 70 min Received pt sitting in recliner, pt agreeable to therapy, and reported pain 9/10 in R hip with mobility but denied any pain at rest. RN notified and present to administer medication during session. Repositioning, rest breaks, and distraction done to reduce pain levels. Session with emphasis on functional mobility/transfers, generalized strengthening, dynamic standing balance/coordination, ambulation, and improved activity tolerance. Pt transferred recliner<>WC stand<>pivot with RW and CGA and transported to dayroom in [facility] total A for time management purposes. Pt ambulated 29ft with RW and CGA demonstrating same gait pattern mentioned in previous note. Worked on dynamic standing balance  playing connect four x 1 trial with UE support on RW and close supervision and x 2 trials without UE support and CGA with emphasis on R lateral  weight shifting. Pt transferred sit<>stand with RW and CGA and performed alternating toe taps to .5in step 3x5 reps. Pt with heavy reliance on UE support on RW when weight shifting to RLE. Pt transferred stand<>pivot mat<>WC with RW and CGA and performed bilateral LE strengthening on Kinetron at 20 cm/sec for 1 minute x 1 trial and 55 seconds x 1 trial without back support with emphasis on trunk control and glute/quad strengthening. Pt then stated "that's it, I can't do anymore" due to pain in RLE. Pt ambulated additional 72ft with RW and CGA. Pt reported 9/10 fatigue after ambulation. Pt performed the following exercises sitting in WC with supervision and verbal cues for technique: -LAQ 2x10 bilaterally  -hip flexion 2x10 bilaterally (AAROM on RLE) -overhead shoulder press with 5lb dowel x8 and with 7lb dowel x8 -horizontal chest press at 90 degrees with 5lb dowel 2x8 Pt transported back to room in [facility] total A and transferred WC<>recliner with RW and CGA. Concluded session with pt sitting in recliner, needs within reach, and seatbelt alarm on.    Therapy Documentation Precautions:  Precautions Precautions: Fall Restrictions Weight Bearing Restrictions: Yes RLE Weight Bearing: Non weight bearing   Therapy/Group: Individual Therapy [name] PT, DPT   01/12/2020, 7:17 AM

## 2020-01-13 ENCOUNTER — Encounter (HOSPITAL_COMMUNITY): Payer: Medicare Other | Admitting: Psychology

## 2020-01-13 ENCOUNTER — Inpatient Hospital Stay (HOSPITAL_COMMUNITY): Payer: Medicare Other

## 2020-01-13 ENCOUNTER — Inpatient Hospital Stay (HOSPITAL_COMMUNITY): Payer: Medicare Other | Admitting: Occupational Therapy

## 2020-01-13 DIAGNOSIS — G479 Sleep disorder, unspecified: Secondary | ICD-10-CM | POA: Diagnosis not present

## 2020-01-13 DIAGNOSIS — S72141S Displaced intertrochanteric fracture of right femur, sequela: Secondary | ICD-10-CM | POA: Diagnosis not present

## 2020-01-13 DIAGNOSIS — G8918 Other acute postprocedural pain: Secondary | ICD-10-CM | POA: Diagnosis not present

## 2020-01-13 DIAGNOSIS — R0989 Other specified symptoms and signs involving the circulatory and respiratory systems: Secondary | ICD-10-CM | POA: Diagnosis not present

## 2020-01-13 LAB — CREATININE, SERUM
Creatinine, Ser: 0.81 mg/dL (ref 0.44–1.00)
GFR, Estimated: >60 mL/min (ref >60)

## 2020-01-13 MED ORDER — LIDOCAINE 5 % EX PTCH
1.0000 | MEDICATED_PATCH | CUTANEOUS | Status: DC
  Administered 2020-01-13 – 2020-01-19 (×7): 1 via TRANSDERMAL
  Filled 2020-01-13 (×8): qty 1

## 2020-01-13 NOTE — Progress Notes (Addendum)
[place] PHYSICAL MEDICINE & REHABILITATION PROGRESS NOTE  Subjective/Complaints: Patient seen sitting up in bed this morning.  She states she slept a little better overnight with the new medication.  She denies complaints.  ROS: Denies CP, SOB, N/V/D  Objective: Vital Signs: Blood pressure (!) 125/55, pulse 76, temperature 98.4 F (36.9 C), temperature source Oral, resp. rate 17, height 5\' 4"  (1.626 m), weight 57.4 kg, SpO2 94 %. No results found. Recent Labs    01/11/20 0550  WBC 6.0  HGB 7.9*  HCT 23.4*  PLT 296   Recent Labs    01/13/20 0442  CREATININE 0.81    Intake/Output Summary (Last 24 hours) at 01/13/2020 1006 Last data filed at 01/13/2020 0730 Gross per 24 hour  Intake 720 ml  Output --  Net 720 ml        Physical Exam: BP (!) 125/55 (BP Location: Right Arm)   Pulse 76   Temp 98.4 F (36.9 C) (Oral)   Resp 17   Ht 5\' 4"  (1.626 m)   Wt 57.4 kg   SpO2 94%   BMI 21.72 kg/m  Constitutional: No distress . Vital signs reviewed. HENT: Normocephalic.  Atraumatic. Eyes: EOMI. No discharge. Cardiovascular: No JVD.  RRR. Respiratory: Normal effort.  No stridor.  Bilateral clear to auscultation. GI: Non-distended.  BS +. Skin: Warm and dry.  Right hip with dressing CDI. Psych: Normal mood.  Normal behavior. Musc: Right hip with edema and mild tenderness. Neuro: Alert Motor:  Right lower extremity: Hip flexion 1+/5 (pain inhibition), knee extension 4-/5 (pain inhibition), ankle dorsiflexion 4+/5, improving Left lower extremity: Hip flexion, knee extension 4-4+/5 (pain inhibition), ankle dorsiflexion 4+/5  Assessment/Plan: 1. Functional deficits which require 3+ hours per day of interdisciplinary therapy in a comprehensive inpatient rehab setting.  Physiatrist is providing close team supervision and 24 hour management of active medical problems listed below.  Physiatrist and rehab team continue to assess barriers to discharge/monitor patient progress  toward functional and medical goals   Care Tool:  Bathing    Body parts bathed by patient: Right arm, Left arm, Chest, Abdomen, Front perineal area, Right upper leg, Left upper leg, Face, Left lower leg   Body parts bathed by helper: Buttocks     Bathing assist Assist Level: Minimal Assistance - Patient > 75%     Upper Body Dressing/Undressing Upper body dressing   What is the patient wearing?: Pull over shirt    Upper body assist Assist Level: Set up assist    Lower Body Dressing/Undressing Lower body dressing      What is the patient wearing?: Pants     Lower body assist Assist for lower body dressing: Minimal Assistance - Patient > 75%     Toileting Toileting    Toileting assist Assist for toileting: Maximal Assistance - Patient 25 - 49%     Transfers Chair/bed transfer  Transfers assist     Chair/bed transfer assist level: Contact Guard/Touching assist Chair/bed transfer assistive device: Programmer, multimedia   Ambulation assist      Assist level: Contact Guard/Touching assist Assistive device: Walker-rolling Max distance: 46ft   Walk 10 feet activity   Assist  Walk 10 feet activity did not occur: Safety/medical concerns (pain, generalized weakness, decreased balance)  Assist level: Contact Guard/Touching assist Assistive device: Walker-rolling   Walk 50 feet activity   Assist Walk 50 feet with 2 turns activity did not occur: Safety/medical concerns  Walk 150 feet activity   Assist Walk 150 feet activity did not occur: Safety/medical concerns         Walk 10 feet on uneven surface  activity   Assist Walk 10 feet on uneven surfaces activity did not occur: Safety/medical concerns         Wheelchair     Assist Will patient use wheelchair at discharge?: Yes Type of Wheelchair: Manual    Wheelchair assist level: Supervision/Verbal cueing Max wheelchair distance: 124ft    Wheelchair 50 feet with  2 turns activity    Assist        Assist Level: Supervision/Verbal cueing   Wheelchair 150 feet activity     Assist      Assist Level: Supervision/Verbal cueing    Medical Problem List and Plan: 1.  Decreased functional ability secondary to right intertrochanteric hip fracture.  Status post open treatment intramedullary implant 01/04/2020.  Weightbearing as tolerated  Continue CIR  Team conference today to discuss current and goals and coordination of care, home and environmental barriers, and discharge planning with nursing, case manager, and therapies. Please see conference note from today as well.  2.  Antithrombotics: -DVT/anticoagulation: Lovenox.     Creatinine within normal notes on 12/8             Venous Dopplers- no evidence of clots.              -antiplatelet therapy: N/A 3. Pain Management: Hydrocodone and Robaxin as needed             Scheduled Tylenol, add heating pad for low back pain.   Lidoderm patch ordered. 4. Mood: Zoloft 50 mg daily             -antipsychotic agents: N/A 5. Neuropsych: This patient is capable of making decisions on her own behalf. 6. Skin/Wound Care: continue staples/foam dressing for now 7. Fluids/Electrolytes/Nutrition: Routine in and outs 8.  Acute blood loss anemia.               Hemoglobin 7.9 on 12/6  Continue to monitor 9.  Hypertension.  Toprolol 25 daily  ?  Stabilizing on 12/8             Monitor with increased activity 10.  Drug-induced constipation.  Colace 100 mg twice daily, MiraLAX daily             Increase bowel meds as necessary 11.  Hyperglycemia-?  Stress-induced  Hemoglobin A1c 5.2 on 12/6 12.  Hypoalbuminemia  Supplement initiated on 12/2 13.  Sleep disturbance  Increased trazodone to 50mg , DC'd  Ambien 5 mg PTA, resumed-educated patient on safety  ?  Improving  LOS: 7 days A FACE TO FACE EVALUATION WAS PERFORMED  Kenyata Guess Lorie Phenix 01/13/2020, 10:06 AM

## 2020-01-13 NOTE — Progress Notes (Signed)
Physical Therapy Session Note  Patient Details  Name: Alison Rivera MRN: [id number] Date of Birth: [birth year]  Today's Date: 01/13/2020 PT Individual Time: 2505-3976 and 7341-9379  PT Individual Time Calculation (min): 54 min and 53 min  Short Term Goals: Week 1:  PT Short Term Goal 1 (Week 1): pt will perform bed mobility with min A overall PT Short Term Goal 2 (Week 1): Pt will transfer bed<>WC with LRAD and CGA PT Short Term Goal 3 (Week 1): Pt will ambulate 83ft with LRAD and CGA  Skilled Therapeutic Interventions/Progress Updates:   Treatment Session 1: 0240-9735 54 min Received pt sitting in recliner, pt agreeable to therapy, and reported low levels of pain in RLE at rest increasing quickly to 8-9/10 with mobility. Repositioning, rest breaks, and distraction done to reduce pain levels. Pt reported sleeping for ~3 hours last night which has been more than she's been getting. Session with emphasis on functional mobility/transfers, generalized strengthening, dynamic standing balance/coordination, ambulation, and improved activity tolerance. Noted pt with RLE edema and doffed sock and R shoe, donned ted hose, and re-donned shoe with total A. Pt transferred recliner<>WC stand<>pivot with RW and CGA and performed WC mobility 139ft using BUE and supervision to therapy gym. Pt ambulated 9ft with RW and CGA. Pt continues to demonstrate antalgic gait pattern "grunting" when weight shifting to RLE, flexed trunk, decreased foot clearance, and poor endurance. Pt transferred sit<>stand with RW and CGA and performed alternating toe taps to 3in step 3x5 reps with BUE support on RW and CGA for balance. Pt then performed the following exercises standing with BUE support on RW and CGA for balance: -alternating marches x10 bilaterally (decreased R hip flexion ROM) -heel raises x10 -hip abduction x10 standing on LLE and x5 standing on RLE (limited by pain) -RLE hamstring curls standing on LLE x10; unable to  perform standing on RLE Transitioned to seated theraex due to pain and fatigue. Pt performed the following exercises seated with supervision and verbal cues for technique: -hamstring curls with grn TB x12 bilaterally  -hip abduction with grn TB 2x10 Pt required multiple rest breaks throughout session due to pain and fatigue. Pt ambulated 103ft with RW and CGA back to [facility] and transported back to room in [facility] total A. Stand<>pivot WC<>recliner with RW and CGA. Concluded session with pt sitting in recliner, needs within reach, and seatbelt alarm on. Therapist provided pt with ice pack to R lateral hip for pain relief.   Treatment Session 2: 1302-1355 53 min Received pt sitting in recliner with daughter and CSW present, pt agreeable to therapy, and reported 8/10 pain in RLE with mobility but did not state pain level at rest. Pt reported not having pain medication since this morning but requested to wait until after session for pain interventions. Educated pt again on importance of receiving pain medication prior to therapy to maximize rehab potential; pt verbalized understanding. Repositioning, rest breaks, and distraction done to reduce pain levels. Session with emphasis on functional mobility/transfers, generalized strengthening, dynamic standing balance/coordination, gait training, and improved activity tolerance.Pt transferred recliner<>WC stand<>pivot with RW and CGA and transported to therapy gym in [facility] total A. Pt transferred sit<>stand inside // bars with CGA and worked on forward/backward gait with BUE support and CGA x 2 laps. Pt performed lateral side stepping with BUE support and CGA x 1 lap. Pt with increased reliance on UE support due to difficulty putting weight on RLE. Pt then ambulated 28ft with RW and CGA. Pt demonstrated improvements  in weight shifting to R and overall less "grunting" compared to previous sessions. Engaged in dynamic standing balance tossing horseshoes using RUE with UE support on RW  and supervision for balance x 1 trial and x 2 trials without UE support and CGA for balance with emphasis on equal weight distribution and weight shifting to the R. Pt transferred sit<>stand on Airex 2x5 reps without UE support and min A for balance as pt unsteady upon standing. Pt reported 7/10 fatgiue after activity. Pt transported back to room in [facility] total A and transferred WC<>recliner with RW and CGA. Concluded session with pt sitting in recliner, needs within reach, and seatbelt alarm on. Husband present at bedside.   Therapy Documentation Precautions:  Precautions Precautions: Fall Restrictions Weight Bearing Restrictions: Yes RLE Weight Bearing: Weight bearing as tolerated   Therapy/Group: Individual Therapy [name] PT, DPT   01/13/2020, 7:17 AM

## 2020-01-13 NOTE — Progress Notes (Signed)
Patient ID: Alison Rivera, female   DOB: 01/18/1932, 86 y.o.   MRN: [id number]  Met with pt, husband and daughter-Katherine to discuss team conference goals supervision level and target discharge of 12/16. Other daughter-[name] wants her to go to her home in [place]. Pt reports she wants to go home and be there with some assist. Daughter asked about hiring assist and was given a private duty list to pursue for additional assist. Discussed would get home health also. Concerns regarding going home with husband and his ability to take care of her. Katherine plans to stay the first few nights to make sure doing ok and then will hire assist as long as husband agree's. Made all aware pt can decide on her own since she is capable of making her own decisions according to the MD. Keep working on discharge plan.  

## 2020-01-13 NOTE — Progress Notes (Signed)
Occupational Therapy Session Note  Patient Details  Name: Alison Rivera MRN: [id number] Date of Birth: [birth year]  Today's Date: 01/13/2020 OT Individual Time: 7371-0626 OT Individual Time Calculation (min): 60 min    Short Term Goals: Week 1:  OT Short Term Goal 1 (Week 1): Pt will complete toilet transfer with CGA. OT Short Term Goal 2 (Week 1): Pt will complete toileting with CGA. OT Short Term Goal 3 (Week 1): Pt will complete LB dressing with min A.  Skilled Therapeutic Interventions/Progress Updates:    Treatment session with focus on LB dressing and functional mobility.  Pt received upright in recliner reporting fatigue but agreeable to therapy session.  Pt already dressed but agreeable to attempting use of AE to increase independence with LB dressing.  Therapist demonstrated use of reacher for donning pants.  Pt able to doff and don pants with use of reacher with min cues.  Pt utilized shoe funnel for donning shoes, still requiring assistance to fasten Right shoe.  Pt completed all sit <> stands during LB dressing with CGA.  Engaged in functional mobility with focus on increased WB through RLE during functional ambulation.  Pt ambulated ~20' x4 CGA with RW around cones to simulate obstacles in home environment.  Pt took seated rest break after each series of ambulation, completing sit <> stand with CGA.  Pt reports increased pain with activity, only 6-7/10 and no loud grimaces in pain this session.  Pt returned to room and transferred stand pivot to recliner.  Pt left with legs elevated and ice applied to Right hip and left with all needs in reach.  Therapy Documentation Precautions:  Precautions Precautions: Fall Restrictions Weight Bearing Restrictions: Yes RLE Weight Bearing: Weight bearing as tolerated Pain: Pain Assessment Pain Scale: 0-10 Pain Score: 7 Pain Type: Acute pain;Surgical pain Pain Location: Hip Pain Orientation: Right Pain Radiating Towards: R. Leg Pain  Descriptors / Indicators: Aching;Constant Pain Frequency: Constant Pain Onset: On-going Pain Intervention(s): Medication (See eMAR) (norco given)   Therapy/Group: Individual Therapy  Simonne Come 01/13/2020, 11:39 AM

## 2020-01-13 NOTE — Plan of Care (Signed)
  Problem: Consults Goal: RH GENERAL PATIENT EDUCATION Description: See Patient Education module for education specifics. Outcome: Progressing   Problem: RH BOWEL ELIMINATION Goal: RH STG MANAGE BOWEL WITH ASSISTANCE Description: STG Manage Bowel with min Assistance. Outcome: Progressing   Problem: RH BLADDER ELIMINATION Goal: RH STG MANAGE BLADDER WITH ASSISTANCE Description: STG Manage Bladder With Min Assistance Outcome: Progressing   Problem: RH SKIN INTEGRITY Goal: RH STG ABLE TO PERFORM INCISION/WOUND CARE W/ASSISTANCE Description: STG Able To Perform Incision/Wound Care With [employer]. Outcome: Progressing   Problem: RH PAIN MANAGEMENT Goal: RH STG PAIN MANAGED AT OR BELOW PT'S PAIN GOAL Description: <4 out of 10.  Outcome: Progressing   Problem: RH KNOWLEDGE DEFICIT GENERAL Goal: RH STG INCREASE KNOWLEDGE OF SELF CARE AFTER HOSPITALIZATION Description: Pt and family will verbalize understanding of self care including medication management, incision care, pain management, and follow up care at the time of discharge with min cues using provided educational materials.  Outcome: Progressing

## 2020-01-13 NOTE — Consult Note (Signed)
Neuropsychological Consultation   Patient:   Alison Rivera   DOB:   05/24/1931  MR Number:  [id number]  Location:  [facility] [address] [facility] [address] [place] [id number] [place] [id number] Dept: [place]: [phone]           Date of Service:   01/13/2019  Start Time:   2 PM End Time:   2:45 PM  Provider/Observer:  Ilean Skill, Psy.D.       Clinical Neuropsychologist       Billing Code/Service: [id number]  Chief Complaint:    Alison Rivera is an 86 year old right-handed female with history of mitral valve prolapse, osteoporosis, hypertension. Patient presented on 01/04/2020 after falling asleep on the toilet and falling landing on her right side. Denies loss of consciousness. Patient sustained right intertrochanteric hip fracture. Patient had experienced a "pop" on initial day of admission for therapies and hip x-ray was ordered and reviewed only showing postsurgical changes.  Reason for Service:  Patient was referred for neuropsychological consultation due to adjustment issues following right hip fracture and participation in CIR. Below see HPI for the current admission.  HPI: Alison Rivera is an 86 year old right-handed female with history of mitral valve prolapse, osteoporosis, hypertension.  History taken from chart review, daughter, and patient.  Patient lives with spouse.  Two-level home bed and bath main level 2 steps to entry.  Independent prior to admission.  Husband can assist as well as daughter who lives nearby.  She presented on 11/39/21 after falling asleep on the toilet and falling landing on her right side.  Denies LOC.  Sustained right intertrochanteric hip fracture.  Admission chemistries unremarkable except calcium 7.1, hemoglobin 10.7.  Underwent open treatment of intertrochanteric fracture with intramedullary implant on 01/04/2020 per Dr. Erlinda Hong.  Weightbearing as tolerated right lower extremity.   Lovenox for DVT prophylaxis.  Hospital course further complicated by acute blood loss anemia, 8.0 and monitored.  Patient noted a "pop" on day of admission with therapies, discussed with attending MD and stat hip x-ray ordered.  Personally reviewed, postsurgical changes.  Therapy evaluations completed the patient was admitted for a comprehensive rehab program.  Please see preadmission assessment for later today as well.  Current Status:  Patient was alert and oriented sitting up in her reclining chair in the upright position. Patient's husband was present at the time as well. Patient was able to describe what it happened clearly and appeared to have good mental status and cognition. Patient denied any severe adjustment issues with extended hospital stay and while she is frustrated with the events around her fractured hip she is reporting adequate coping.  Behavioral Observation: Alison Rivera  presents as a 86 y.o.-year-old Right Caucasian Female who appeared her stated age. her dress was Appropriate and she was Well Groomed and her manners were Appropriate to the situation.  her participation was indicative of Appropriate and Attentive behaviors.  There were any physical disabilities noted.  she displayed an appropriate level of cooperation and motivation.     Interactions:    Active Appropriate  Attention:   within normal limits and attention span and concentration were age appropriate  Memory:   within normal limits; recent and remote memory intact  Visuo-spatial:  not examined  Speech (Volume):  normal  Speech:   normal; normal  Thought Process:  Coherent and Relevant  Though Content:  WNL; not suicidal and not homicidal  Orientation:   person, place,  time/date and situation  Judgment:   Fair  Planning:   Fair  Affect:    Appropriate  Mood:    Dysphoric  Insight:   Good  Intelligence:   normal  Medical History:   Past Medical History:  Diagnosis Date  . Chest pain,  atypical   . Lichen planus   . Mitral valve prolapse   . Osteoporosis   . Squamous cell skin cancer          Patient Active Problem List   Diagnosis Date Noted  . Labile blood pressure   . Hypoalbuminemia due to protein-calorie malnutrition ([place])   . Hyperglycemia   . Sleep disturbance   . Intertrochanteric fracture of right hip ([place]) 01/06/2020  . Closed nondisplaced intertrochanteric fracture of right femur ([place])   . Acute blood loss anemia   . Essential hypertension   . Postoperative pain   . Drug induced constipation   . Closed fracture of femur, intertrochanteric, right, initial encounter ([place]) 01/04/2020  . Anemia 01/04/2020  . Metabolic acidosis 55/37/4827  . Coagulation test abnormality 01/04/2020  . Anxiety 03/30/2013  . Obsessive-compulsive disorders 03/30/2013  . Essential hypertension, benign 03/30/2013  . Osteoporosis, unspecified 03/30/2013  . Chest pain, atypical   . Mitral valve prolapse   . MIGRAINE HEADACHE 10/29/2006  . HYPERTENSION 10/29/2006  . ATRIAL ARRHYTHMIAS 10/29/2006  . OSTEOPENIA 10/29/2006  . INSOMNIA 10/29/2006  . URINARY FREQUENCY 10/29/2006     Psychiatric History:  Patient has a past history of anxiety but denies any significant exacerbation of her anxiety beyond what she would expect given her recent hip fracture.  Family Med/Psych History:  Family History  Problem Relation Age of Onset  . Other Mother        AD  . Congestive Heart Failure Father   . Syncope episode Father   . Parkinson's disease Brother   . Other Brother        Deep Brain Stimulator   Impression/DX:  Alison Rivera is an 86 year old right-handed female with history of mitral valve prolapse, osteoporosis, hypertension. Patient presented on 01/04/2020 after falling asleep on the toilet and falling landing on her right side. Denies loss of consciousness. Patient sustained right intertrochanteric hip fracture. Patient had experienced a "pop" on initial day of admission  for therapies and hip x-ray was ordered and reviewed only showing postsurgical changes.  Patient was alert and oriented sitting up in her reclining chair in the upright position. Patient's husband was present at the time as well. Patient was able to describe what it happened clearly and appeared to have good mental status and cognition. Patient denied any severe adjustment issues with extended hospital stay and while she is frustrated with the events around her fractured hip she is reporting adequate coping.  Disposition/Plan:  Today we worked on coping and adjustment issues. Patient acknowledged some mild increase in anxiety but felt it was appropriate given the current situation of her recent hip fracture. Patient agreed to inform treatment team if her anxiety becomes exacerbated and has a deleterious effect on therapeutic activities.  Diagnosis:    Right hip fx.        Electronically Signed   _______________________ Ilean Skill, Psy.D. Clinical Neuropsychologist

## 2020-01-13 NOTE — Patient Care Conference (Signed)
Inpatient RehabilitationTeam Conference and Plan of Care Update Date: 01/13/2020   Time: 11:37 AM    Patient Name: Alison Rivera      Medical Record Number: [id number]  Date of Birth: 05/03/1931 Sex: Female         Room/Bed: 4M08C/4M08C-01 Payor Info: Payor: MEDICARE / Plan: MEDICARE PART A AND B / Product Type: *No Product type* /    Admit Date/Time:  01/06/2020  4:34 PM  Primary Diagnosis:  Intertrochanteric fracture of right hip [facility])  Hospital Problems: Principal Problem:   Intertrochanteric fracture of right hip ([place]) Active Problems:   Hypoalbuminemia due to protein-calorie malnutrition ([place])   Hyperglycemia   Sleep disturbance   Labile blood pressure    Expected Discharge Date: Expected Discharge Date: 01/21/20  Team Members Present: Physician leading conference: Dr. Delice Lesch Care Coodinator Present: Dorien Chihuahua, RN, BSN, CRRN;Becky Dupree, LCSW Nurse Present: Mohammed Kindle, RN PT Present: Becky Sax, PT OT Present: Simonne Come, OT PPS Coordinator present : Gunnar Fusi, Novella Olive, PT     Current Status/Progress Goal Weekly Team Focus  Bowel/Bladder   continent of B/B  remain continenet of B/B  assess B/B q shift and PRN   Swallow/Nutrition/ Hydration             ADL's   Min assist ambulatory transfers with RW, min assist LB dressing, mod assist for footwear, min assist bathing  Mod I overall, supervision shower transfer and bathing - will most likely downgrade to supervision overall due to pain limiting mobility  ADL retraining, dynamic standing balance, WB tolerance in standing, functional transfers   Mobility   bed mobility mod/max A, transfers with RW CGA, gait 85ft with RW min A, unable to do stairs.  supervision with LRAD, min A for steps  functional mobility/transfers, generalized strengthening, weight bearing on RLE, standing balance/coordination, gait, stairs, and endurance   Communication             Safety/Cognition/ Behavioral  Observations            Pain   8 of 10  pain <3  assess pain q shift and PRN   Skin   staples to R side surgical incision  heal surgical site, remove staples  assess skin q shift and PRN     Discharge Planning:  HOme with husband and two daughter's assisting with her care. Husband clear he is in charge and not their daughter's. He can have ramp built if needed.   Team Discussion: Patient reports insomnia; staff note progress limited by fatigue. Right hip pain controlled with prn medications. MD to add lidocaine patch for pain control. Continue Lovenox through 01/18/20; will not need to continue at discharge per MD.  Patient on target to meet rehab goals: yes, supervsion goals set. Currently CG assist for transfers and ambulated 27' with pain noted during activity.  *See Care Plan and progress notes for long and short-term goals.   Revisions to Treatment Plan:  Adaptive equipment trials  Teaching Needs: Transfers, toileting, medications, skin care, etc.  Current Barriers to Discharge: Decreased caregiver support and Home enviroment access/layout  Possible Resolutions to Barriers: Family education Ramp recommended for discharge [place] follow up services  Patient has all DME except for wheelchair; daughter seeking this item as out of pocket purchase.     Medical Summary Current Status: Decreased functional ability secondary to right intertrochanteric hip fracture.  Status post open treatment intramedullary implant 01/04/2020  Barriers to Discharge: Medical stability   Possible  Resolutions to [employer]: Therapies, optimize sleep meds, optimize BP meds, follow labs - Hb, optimize pain meds   Continued Need for Acute Rehabilitation Level of Care: The patient requires daily medical management by a physician with specialized training in physical medicine and rehabilitation for the following reasons: Direction of a multidisciplinary physical rehabilitation program to  maximize functional independence : Yes Medical management of patient stability for increased activity during participation in an intensive rehabilitation regime.: Yes Analysis of laboratory values and/or radiology reports with any subsequent need for medication adjustment and/or medical intervention. : Yes   I attest that I was present, lead the team conference, and concur with the assessment and plan of the team.   Dorien Chihuahua B 01/13/2020, 2:41 PM

## 2020-01-14 ENCOUNTER — Inpatient Hospital Stay (HOSPITAL_COMMUNITY): Payer: Medicare Other

## 2020-01-14 ENCOUNTER — Inpatient Hospital Stay (HOSPITAL_COMMUNITY): Payer: Medicare Other | Admitting: Occupational Therapy

## 2020-01-14 ENCOUNTER — Inpatient Hospital Stay (HOSPITAL_COMMUNITY): Payer: Medicare Other | Admitting: Physical Therapy

## 2020-01-14 DIAGNOSIS — S72141S Displaced intertrochanteric fracture of right femur, sequela: Secondary | ICD-10-CM | POA: Diagnosis not present

## 2020-01-14 DIAGNOSIS — I10 Essential (primary) hypertension: Secondary | ICD-10-CM | POA: Diagnosis not present

## 2020-01-14 DIAGNOSIS — G479 Sleep disorder, unspecified: Secondary | ICD-10-CM | POA: Diagnosis not present

## 2020-01-14 DIAGNOSIS — D62 Acute posthemorrhagic anemia: Secondary | ICD-10-CM | POA: Diagnosis not present

## 2020-01-14 MED ORDER — MIRTAZAPINE 15 MG PO TABS
15.0000 mg | ORAL_TABLET | Freq: Every day | ORAL | Status: DC
  Administered 2020-01-14 – 2020-01-20 (×7): 15 mg via ORAL
  Filled 2020-01-14 (×7): qty 1

## 2020-01-14 MED ORDER — METHOCARBAMOL 500 MG PO TABS
500.0000 mg | ORAL_TABLET | Freq: Four times a day (QID) | ORAL | Status: DC
  Administered 2020-01-14 – 2020-01-21 (×26): 500 mg via ORAL
  Filled 2020-01-14 (×26): qty 1

## 2020-01-14 MED ORDER — METHOCARBAMOL 1000 MG/10ML IJ SOLN
500.0000 mg | Freq: Four times a day (QID) | INTRAVENOUS | Status: DC
  Filled 2020-01-14: qty 5

## 2020-01-14 NOTE — Progress Notes (Signed)
[place] PHYSICAL MEDICINE & REHABILITATION PROGRESS NOTE  Subjective/Complaints: Patient seen sitting up in bed this morning.  She states she did not sleep well overnight.  Per nursing, daughter states that patient should not be receiving Ambien and should be on Remeron.  Discussed with patient, who does not agree that she should not be on Ambien, but is willing to try Remeron.  ROS: Denies CP, SOB, N/V/D  Objective: Vital Signs: Blood pressure (!) 137/58, pulse 70, temperature 98.3 F (36.8 C), temperature source Oral, resp. rate 16, height 5\' 4"  (1.626 m), weight 57.4 kg, SpO2 95 %. No results found. No results for input(s): WBC, HGB, HCT, PLT in the last 72 hours. Recent Labs    01/13/20 0442  CREATININE 0.81    Intake/Output Summary (Last 24 hours) at 01/14/2020 1023 Last data filed at 01/14/2020 0700 Gross per 24 hour  Intake 517 ml  Output -  Net 517 ml        Physical Exam: BP (!) 137/58 (BP Location: Right Arm)   Pulse 70   Temp 98.3 F (36.8 C) (Oral)   Resp 16   Ht 5\' 4"  (1.626 m)   Wt 57.4 kg   SpO2 95%   BMI 21.72 kg/m  Constitutional: No distress . Vital signs reviewed. HENT: Normocephalic.  Atraumatic. Eyes: EOMI. No discharge. Cardiovascular: No JVD.  RRR. Respiratory: Normal effort.  No stridor.  Bilateral clear to auscultation. GI: Non-distended.  BS +. Skin: Warm and dry.  Right hip with dressing CDI Psych: Normal mood.  Normal behavior. Musc: Right hip with edema and tenderness. Neuro: Alert Motor:  Right lower extremity: Hip flexion 1+/5 (pain inhibition), knee extension 4-/5 (pain inhibition), ankle dorsiflexion 4+/5, stable Left lower extremity: Hip flexion, knee extension 4-4+/5 (pain inhibition), ankle dorsiflexion 4+/5  Assessment/Plan: 1. Functional deficits which require 3+ hours per day of interdisciplinary therapy in a comprehensive inpatient rehab setting.  Physiatrist is providing close team supervision and 24 hour management  of active medical problems listed below.  Physiatrist and rehab team continue to assess barriers to discharge/monitor patient progress toward functional and medical goals   Care Tool:  Bathing    Body parts bathed by patient: Right arm,Left arm,Chest,Abdomen,Front perineal area,Right upper leg,Left upper leg,Face,Left lower leg   Body parts bathed by helper: Buttocks     Bathing assist Assist Level: Minimal Assistance - Patient > 75%     Upper Body Dressing/Undressing Upper body dressing   What is the patient wearing?: Pull over shirt    Upper body assist Assist Level: Set up assist    Lower Body Dressing/Undressing Lower body dressing      What is the patient wearing?: Pants     Lower body assist Assist for lower body dressing: Contact Guard/Touching assist     Toileting Toileting    Toileting assist Assist for toileting: Maximal Assistance - Patient 25 - 49%     Transfers Chair/bed transfer  Transfers assist     Chair/bed transfer assist level: Contact Guard/Touching assist Chair/bed transfer assistive device: Programmer, multimedia   Ambulation assist      Assist level: Contact Guard/Touching assist Assistive device: Walker-rolling Max distance: 12ft   Walk 10 feet activity   Assist  Walk 10 feet activity did not occur: Safety/medical concerns (pain, generalized weakness, decreased balance)  Assist level: Contact Guard/Touching assist Assistive device: Walker-rolling   Walk 50 feet activity   Assist Walk 50 feet with 2 turns activity did not occur:  Safety/medical concerns         Walk 150 feet activity   Assist Walk 150 feet activity did not occur: Safety/medical concerns         Walk 10 feet on uneven surface  activity   Assist Walk 10 feet on uneven surfaces activity did not occur: Safety/medical concerns         Wheelchair     Assist Will patient use wheelchair at discharge?: Yes Type of Wheelchair:  Manual    Wheelchair assist level: Supervision/Verbal cueing Max wheelchair distance: 174ft    Wheelchair 50 feet with 2 turns activity    Assist        Assist Level: Supervision/Verbal cueing   Wheelchair 150 feet activity     Assist      Assist Level: Supervision/Verbal cueing    Medical Problem List and Plan: 1.  Decreased functional ability secondary to right intertrochanteric hip fracture.  Status post open treatment intramedullary implant 01/04/2020.  Weightbearing as tolerated  Continue CIR 2.  Antithrombotics: -DVT/anticoagulation: Lovenox.     Creatinine within normal notes on 12/8             Venous Dopplers- no evidence of clots.              -antiplatelet therapy: N/A 3. Pain Management: Hydrocodone   Robaxin as needed, changed to scheduled on 12/9             Scheduled Tylenol, add heating pad for low back pain.   Lidoderm patch ordered. 4. Mood: Zoloft 50 mg daily             -antipsychotic agents: N/A 5. Neuropsych: This patient is capable of making decisions on her own behalf. 6. Skin/Wound Care: continue staples/foam dressing for now 7. Fluids/Electrolytes/Nutrition: Routine in and outs  Labs ordered for tomorrow 8.  Acute blood loss anemia.               Hemoglobin 7.9 on 12/6, labs ordered for tomorrow  Continue to monitor 9.  Hypertension.  Toprolol 25 daily  Relatively controlled on 12/9             Monitor with increased activity 10.  Drug-induced constipation.  Colace 100 mg twice daily, MiraLAX daily             Increase bowel meds as necessary 11.  Hyperglycemia-?  Stress-induced  Hemoglobin A1c 5.2 on 12/6 12.  Hypoalbuminemia  Supplement initiated on 12/2 13.  Sleep disturbance  Increased trazodone to 50mg , DC'd  Ambien 5 mg PTA, resumed-educated patient on safety, DC'd per daughter request  Remeron 15mg  started on 12/9  LOS: 8 days A FACE TO FACE EVALUATION WAS PERFORMED  Alison Rivera Alison Rivera 01/14/2020, 10:23 AM

## 2020-01-14 NOTE — Progress Notes (Signed)
Physical Therapy Session Note  Patient Details  Name: TAMANIKA HEINEY MRN: [id number] Date of Birth: January 27, 1932  Today's Date: 01/14/2020 PT Individual Time: 1715-1730 PT Individual Time Calculation (min): 15 min   Short Term Goals: Week 1:  PT Short Term Goal 1 (Week 1): pt will perform bed mobility with min A overall PT Short Term Goal 2 (Week 1): Pt will transfer bed<>WC with LRAD and CGA PT Short Term Goal 3 (Week 1): Pt will ambulate 75f with LRAD and CGA  Skilled Therapeutic Interventions/Progress Updates:    Pt received in recliner and agreeable to PT. Pt participated in 2x10 alternating marching in RW, LAQs on each side, and sit<>stands. Rest breaks provided throughout. With marching, pt required CGA in order to maintain balance and required supervision for sit>stands. During marching, pain noted below knee in RLE, however patient does not rate pain. Dinner provided at end of session. PT assisted with meal setup and left patient in recliner with call bell in reach, chair alarm on, and needs met.   Therapy Documentation Precautions:  Precautions Precautions: Fall Restrictions Weight Bearing Restrictions: No RLE Weight Bearing: Weight bearing as tolerated Vital Signs: Therapy Vitals Temp: 97.8 F (36.6 C) Temp Source: Oral Pulse Rate: 65 Resp: 16 BP: (!) 126/46 Patient Position (if appropriate): Sitting Oxygen Therapy SpO2: 100 % O2 Device: Room Air    Therapy/Group: Individual Therapy  BGaylord Shih12/10/2019, 5:52 PM

## 2020-01-14 NOTE — Progress Notes (Signed)
Occupational Therapy Session Note  Patient Details  Name: Alison Rivera MRN: [id number] Date of Birth: 24-Mar-1931  Today's Date: 01/14/2020 OT Individual Time: 5188-4166   &   1100-1200 OT Individual Time Calculation (min): 58 min   &   60 min   Short Term Goals: Week 1:  OT Short Term Goal 1 (Week 1): Pt will complete toilet transfer with CGA. OT Short Term Goal 2 (Week 1): Pt will complete toileting with CGA. OT Short Term Goal 3 (Week 1): Pt will complete LB dressing with min A.  Skilled Therapeutic Interventions/Progress Updates:    Session 1:   Patient seated in recliner, states that her back is sore (level "4") and that she is exhausted due to limited sleep at hs.  Nursing provided pain meds during session.  Sit to stand from recliner CGA/CS.  She changed clothing seated in recliner - set up for [place] shirt and CGA for pulling pants over hips in stance.  She declined shower and states that she already washed face and brushed teeth.   SPT with RW to/from bed and recliner with CGA.  Completed unsupported sitting trunk/hip/shoulder mobility and stability activities with focus on light stretch for lower back and pain control.  Sit to supine in bed with min A - reviewed and practiced positioning at bed level with sidelying and pillow placement.  Supine to sit with min A.   Returned to recliner for positioning recommendations - provided cushion for seat to increase trunk elevation, reviewed recline option on side of chair and soft support for right LE, also provided options for less severe LE elevation that was more comfortable to patient.  Ambulation with RW approx 60 feet CGA .  She returned to recliner at close of session, seat belt alarm set and call bell in hand.     Session 2:   Patient dozing in recliner - she notes improved comfort with postioning efforts.  Also discussed moving to lessen stiffness with staying in one position.  Sit to stand from recliner and ambulation with RW to/from toilet  with CGA, toileting completed with CGA.  She ambulated to sink w/ RW for hand hygiene in stance CGA.  Reviewed and practiced reach and transport of items in kitchen environment, hand placement, strategies for efficiency and safety using RW.  Provided walker bag.  She was able to demonstrate understanding with min cues.   Completed UB ergometer 2 x 2.5 minutes, theraband exercises. Returned to recliner at close of session, seat belt alarm set and call bell in hand.      Therapy Documentation Precautions:  Precautions Precautions: Fall Restrictions Weight Bearing Restrictions: No RLE Weight Bearing: Weight bearing as tolerated   Therapy/Group: Individual Therapy  Carlos Levering 01/14/2020, 7:37 AM

## 2020-01-14 NOTE — Progress Notes (Signed)
Physical Therapy Session Note  Patient Details  Name: Alison Rivera MRN: [id number] Date of Birth: 04/10/1931  Today's Date: 01/14/2020 PT Individual Time: 6834-1962 PT Individual Time Calculation (min): 54 min   Short Term Goals: Week 1:  PT Short Term Goal 1 (Week 1): pt will perform bed mobility with min A overall PT Short Term Goal 2 (Week 1): Pt will transfer bed<>WC with LRAD and CGA PT Short Term Goal 3 (Week 1): Pt will ambulate 31ft with LRAD and CGA  Skilled Therapeutic Interventions/Progress Updates:   Received pt sitting in WC at sink with NT, pt agreeable to therapy, and reported pain 8/10 in R hip with mobility. RN notified and present to administer medication during session. Repositioning, rest breaks, and distraction done to reduce pain levels. Session with emphasis on functional mobility/transfers, generalized strengthening, dynamic standing balance/coordination, ambulation, stair navigation, and improved activity tolerance. Pt able to perform stand<>pivot transfers with RW and CGA/close supervision consistently throughout session. Pt transported to therapy gym in [facility] total A for time management purposes and ambulated 31ft with RW and CGA. Pt with improvements in weight shifting to RLE but continues to demonstrate antalgic gait pattern. Pt navigated 3 steps with 2 rails and mod A ascending and descending with a step to pattern. Pt required cues for "up with the good, down with the bad" technique and frequently groaning due to pain. Pt performed the following activities on Biodex with emphasis on weight shifting to the R with mod A to get on/off Biodex: -postural stability training for 1.5 minutes with BUE support fading to 1 UE support with CGA for balance on level static. -limits of stability training for 2 minutes and 16 seconds with BUE support and CGA on level static. Pt transported to ortho gym and ambulated 56ft on uneven surfaces (ramp) with RW and CGA. Pt transferred  sit<>stand with RW and CGA and performed the following exercises standing with BUE support on RW and CGA for balance: -alternating marches 2x10 -heel raises 2x15 Pt transported back to room in [facility] total A and returned on recliner. Concluded session with pt sitting in recliner, needs within reach, and seatbelt alarm on. Husband present at bedside.   Therapy Documentation Precautions:  Precautions Precautions: Fall Restrictions Weight Bearing Restrictions: No RLE Weight Bearing: Weight bearing as tolerated   Therapy/Group: Individual Therapy [name] PT, DPT   01/14/2020, 7:31 AM

## 2020-01-15 ENCOUNTER — Inpatient Hospital Stay (HOSPITAL_COMMUNITY): Payer: Medicare Other | Admitting: Occupational Therapy

## 2020-01-15 ENCOUNTER — Inpatient Hospital Stay (HOSPITAL_COMMUNITY): Payer: Medicare Other

## 2020-01-15 LAB — CBC WITH DIFFERENTIAL/PLATELET
Abs Immature Granulocytes: 0.04 10*3/uL (ref 0.00–0.07)
Basophils Absolute: 0 10*3/uL (ref 0.0–0.1)
Basophils Relative: 0 %
Eosinophils Absolute: 0.1 10*3/uL (ref 0.0–0.5)
Eosinophils Relative: 1 %
HCT: 25.4 % — ABNORMAL LOW (ref 36.0–46.0)
Hemoglobin: 8.5 g/dL — ABNORMAL LOW (ref 12.0–15.0)
Immature Granulocytes: 1 %
Lymphocytes Relative: 25 %
Lymphs Abs: 1.3 10*3/uL (ref 0.7–4.0)
MCH: 31.7 pg (ref 26.0–34.0)
MCHC: 33.5 g/dL (ref 30.0–36.0)
MCV: 94.8 fL (ref 80.0–100.0)
Monocytes Absolute: 0.6 10*3/uL (ref 0.1–1.0)
Monocytes Relative: 12 %
Neutro Abs: 3.2 10*3/uL (ref 1.7–7.7)
Neutrophils Relative %: 61 %
Platelets: 374 10*3/uL (ref 150–400)
RBC: 2.68 MIL/uL — ABNORMAL LOW (ref 3.87–5.11)
RDW: 16.1 % — ABNORMAL HIGH (ref 11.5–15.5)
WBC: 5.2 10*3/uL (ref 4.0–10.5)
nRBC: 0 % (ref 0.0–0.2)

## 2020-01-15 LAB — BASIC METABOLIC PANEL
Anion gap: 10 (ref 5–15)
BUN: 16 mg/dL (ref 8–23)
CO2: 27 mmol/L (ref 22–32)
Calcium: 9.3 mg/dL (ref 8.9–10.3)
Chloride: 99 mmol/L (ref 98–111)
Creatinine, Ser: 0.72 mg/dL (ref 0.44–1.00)
GFR, Estimated: >60 mL/min (ref >60)
Glucose, Bld: 93 mg/dL (ref 70–99)
Potassium: 4.1 mmol/L (ref 3.5–5.1)
Sodium: 136 mmol/L (ref 135–145)

## 2020-01-15 NOTE — Progress Notes (Signed)
Physical Therapy Weekly Progress Note  Patient Details  Name: Alison Rivera MRN: [id number] Date of Birth: 1931-07-09  Beginning of progress report period: January 07, 2020 End of progress report period: January 15, 2020  Patient has met 3 of 3 short term goals. Pt demonstrates steady progress towards long term goals. Pt is currently able to perform bed mobility with min A without using leg lifter and supervision using leg lifter, transfers with RW and CGA, ambulate 37f with RW and CGA, navigate 3 steps with 2 rails mod A, and perform WC mobility up to 1533fwith supervision. Pt continues to be limited by high pain levels with any functional task that involves weight bearing on RLE.   Patient continues to demonstrate the following deficits muscle weakness and muscle joint tightness, decreased cardiorespiratoy endurance and decreased standing balance, decreased postural control and decreased balance strategies and therefore will continue to benefit from skilled PT intervention to increase functional independence with mobility.  Patient progressing toward long term goals..  Continue plan of care.  PT Short Term Goals Week 1:  PT Short Term Goal 1 (Week 1): pt will perform bed mobility with min A overall PT Short Term Goal 1 - Progress (Week 1): Met PT Short Term Goal 2 (Week 1): Pt will transfer bed<>WC with LRAD and CGA PT Short Term Goal 2 - Progress (Week 1): Met PT Short Term Goal 3 (Week 1): Pt will ambulate 1554fith LRAD and CGA PT Short Term Goal 3 - Progress (Week 1): Met Week 2:  PT Short Term Goal 1 (Week 2): STG=LTG due to LOS  Skilled Therapeutic Interventions/Progress Updates:  Ambulation/gait training;Discharge planning;Functional mobility training;Therapeutic Activities;Balance/vestibular training;Disease management/prevention;Neuromuscular re-education;Skin care/wound management;Therapeutic Exercise;Wheelchair propulsion/positioning;DME/adaptive equipment instruction;Pain  management;Splinting/orthotics;UE/LE Strength taining/ROM;Community reintegration;Functional electrical stimulation;Patient/family education;Stair training;UE/LE Coordination activities   Therapy Documentation Precautions:  Precautions Precautions: Fall Restrictions Weight Bearing Restrictions: No RLE Weight Bearing: Weight bearing as tolerated  Therapy/Group: Individual Therapy AnnAlfonse Alpers, DPT  01/15/2020, 7:28 AM

## 2020-01-15 NOTE — Progress Notes (Signed)
Occupational Therapy Weekly Progress Note  Patient Details  Name: Alison Rivera MRN: [id number] Date of Birth: 11/21/1931  Beginning of progress report period: January 07, 2020 End of progress report period: January 15, 2020  Today's Date: 01/15/2020 OT Individual Time: 1049-1200 OT Individual Time Calculation (min): 71 min    Patient has met 3 of 3 short term goals.  Pt is making steady progress towards goals.  Pt is able to complete LB dressing with use of AE with min assist at sit > stand level.  Pt is demonstrating improved ability to tolerate increased WB through RLE during dynamic standing and ambulation.  Pt is able to complete ambulatory transfers, including toilet with CGA with RW.  Pt is able to complete toileting tasks with CGA with improved dynamic standing balance.    Patient continues to demonstrate the following deficits: muscle weakness and muscle joint tightness, decreased cardiorespiratoy endurance and decreased standing balance and decreased balance strategies and therefore will continue to benefit from skilled OT intervention to enhance overall performance with BADL and Reduce care partner burden.  Patient progressing toward long term goals..  Continue plan of care.  OT Short Term Goals Week 1:  OT Short Term Goal 1 (Week 1): Pt will complete toilet transfer with CGA. OT Short Term Goal 1 - Progress (Week 1): Met OT Short Term Goal 2 (Week 1): Pt will complete toileting with CGA. OT Short Term Goal 2 - Progress (Week 1): Met OT Short Term Goal 3 (Week 1): Pt will complete LB dressing with min A. OT Short Term Goal 3 - Progress (Week 1): Met Week 2:  OT Short Term Goal 1 (Week 2): STG = LTGs due to remaining LOS  Skilled Therapeutic Interventions/Progress Updates:    Treatment session with focus on functional mobility, dynamic standing balance, and increased tolerance of WB through RLE during dynamic standing.  Pt received upright in recliner stating she "probably  should" shower, but declined due to "already dressed" during previous session.  Discussed progress towards goals and plan to shower on Monday during therapy session.  Pt completed short distance ambulatory transfers in room with RW with CGA to close supervision.  Pt demonstrating improved acceptance of WB through RLE during ambulatory transfers.  Engaged in dynamic standing activity with focus on weight shifting and toe tapping to various targets to challenge standing balance, weight shifting, and weight bearing through RLE.  Utilized vertical card matching activity in standing to increase weight shifting when reaching up and outside BOS to obtain cards.  Pt demonstrating difficulty with sequencing, requiring max cues for completion of task.  Engaged in obstacle course with stepping over obstacles, therapist educating on technique when stepping over obstacle with RW and providing min assist due to pt taking large step vs getting closer to obstacle prior to stepping over.  Returned to room and transferred back to recliner with CGA.  Pt remained upright in recliner with legs elevated, seat belt alarm on, and all needs in reach.  Therapy Documentation Precautions:  Precautions Precautions: Fall Restrictions Weight Bearing Restrictions: No RLE Weight Bearing: Weight bearing as tolerated Pain: Pt with c/o pain 7/10 with mobility.  Therapy/Group: Individual Therapy  Simonne Come 01/15/2020, 12:55 PM

## 2020-01-15 NOTE — Progress Notes (Signed)
Physical Therapy Session Note  Patient Details  Name: Alison Rivera MRN: [id number] Date of Birth: Oct 17, 1931  Today's Date: 01/15/2020 PT Individual Time: 8280-0349 and 1791-5056 PT Individual Time Calculation (min): 54 min and 55 min  Short Term Goals: Week 1:  PT Short Term Goal 1 (Week 1): pt will perform bed mobility with min A overall PT Short Term Goal 1 - Progress (Week 1): Met PT Short Term Goal 2 (Week 1): Pt will transfer bed<>WC with LRAD and CGA PT Short Term Goal 2 - Progress (Week 1): Met PT Short Term Goal 3 (Week 1): Pt will ambulate 77f with LRAD and CGA PT Short Term Goal 3 - Progress (Week 1): Met Week 2:  PT Short Term Goal 1 (Week 2): STG=LTG due to LOS  Skilled Therapeutic Interventions/Progress Updates:  Treatment Session 1: 09794-801654 min Received pt sitting in recliner, pt agreeable to therapy, and reported pain 8/10 in R hip (premedicated). Repositioning, rest breaks, and distraction done to reduce pain levels. Pt reported sleeping better last night. Session with emphasis on dressing, functional mobility/transfers, generalized strengthening, dynamic standing balance/coordination, ambulation, and improved activity tolerance. Pt able to perform stand<>pivot transfers with RW and close supervision throughout session. Doffed gown and donned clean pull over shirt with supervision. Donned bilateral ted hose total A and underwear and pants sitting in recliner with min A. Pt transferred sit<>stand with RW and close supervision and able to pull underwear/pants over hips with CGA. Worked on bed mobility and pt transferred sit<>supine with min A for RLE management due to pain. Educated pt on use of gait belt to assist with independence with bed mobility. Pt then transferred supine<>sitting EOB with supervision using gait belt as leg lifter. [facility] purposes and performed BUE/LE strengthening on Nustep at workload 3 for 10  minutes for a total of 207 steps for improved cardiovascular endurance and R hip ROM. Pt ambulated 423fx1 and 8290f 1 with RW and CGA. Pt with improvements in weight shifting to RLE but continues to demonstrate antalgic gait pattern and "grunting" with increased fatigue/pain. Pt performed the following exercises sitting on mat with supervision and verbal cues for technique: -bicep curls with 4lb dowel 2x12 -overhead shoulder press with 4lb dowel 1x10 and 1x12 -horizontal chest press at 90 degrees with 4lb dowel 2x10  [facility] Concluded session with pt sitting in recliner, needs within reach, and seatbelt alarm on.   Treatment Session 2: 1430-1525 55 min Received pt sitting in recliner, pt agreeable to therapy, and reported low levels of pain in R hip but did not state level. Repositioning, rest breaks, and distraction done to reduce pain levels. Session with emphasis on toileting, functional mobility/transfers, generalized strengthening, dynamic standing balance/coordination, ambulation, and improved activity tolerance. Pt reported urge to use restroom and ambulated [id number] trials to/from bathroom with RW and supervision. Pt able to manage clothing and void with supervision. Pt stood and used hand sanitizer to clean hands with CGA for balance. [facility] and performed TUG with RW and close supervision x 3 trials with average of 44 seconds.  Trial 1: 48 seconds Trail 2: 41 seconds Trial 3: 45 seconds  Educated pt on test results and significance indicating high fall risk and encouraged pt to use RW at home; pt verbalized understanding and in agreement. Engaged in dynamic  standing balance in tandem stance with no UE support and min A for balance x 2 trials bilaterally. Pt able to maintain R SLS for ~10 seconds and L SLS for ~15 seconds. Attempted SLS however pt unable to stand on RLE even with BUE support but  able to maintain L SLS ~8 seconds. Pt performed 5x sit<>stand in 22 seconds with close supervision using UEs to push up from [facility]. Worked on dynamic standing balance playing cornhole without AD and CGA for balance x 3 trials. Pt performed WC mobility >367f using BUE and supervision back to room for improved cardiovascular endurance and UE strengthening. Pt transferred stand<>pivot WC<>recliner with RW and supervision. Concluded session with pt sitting in recliner, needs within reach, and seatbelt alarm on. Husband present at bedside.   Therapy Documentation Precautions:  Precautions Precautions: Fall Restrictions Weight Bearing Restrictions: No RLE Weight Bearing: Weight bearing as tolerated   Therapy/Group: Individual Therapy AAlfonse AlpersPT, DPT   01/15/2020, 7:32 AM

## 2020-01-15 NOTE — Progress Notes (Signed)
[place] PHYSICAL MEDICINE & REHABILITATION PROGRESS NOTE  Subjective/Complaints: Slept better last night. No complaints this morning. When asked, states she does have hip pain- recently received her pain medication.  Moving bowels regularly.   ROS: Denies CP, SOB, N/V/D  Objective: Vital Signs: Blood pressure 138/70, pulse 69, temperature 98.4 F (36.9 C), temperature source Oral, resp. rate 16, height 5\' 4"  (1.626 m), weight 57.4 kg, SpO2 98 %. No results found. Recent Labs    01/15/20 0441  WBC 5.2  HGB 8.5*  HCT 25.4*  PLT 374   Recent Labs    01/13/20 0442 01/15/20 0441  NA  --  136  K  --  4.1  CL  --  99  CO2  --  27  GLUCOSE  --  93  BUN  --  16  CREATININE 0.81 0.72  CALCIUM  --  9.3    Intake/Output Summary (Last 24 hours) at 01/15/2020 1132 Last data filed at 01/14/2020 2144 Gross per 24 hour  Intake 340 ml  Output --  Net 340 ml        Physical Exam: BP 138/70 (BP Location: Right Arm)   Pulse 69   Temp 98.4 F (36.9 C) (Oral)   Resp 16   Ht 5\' 4"  (1.626 m)   Wt 57.4 kg   SpO2 98%   BMI 21.72 kg/m  Gen: no distress, normal appearing HEENT: oral mucosa pink and moist, NCAT Cardio: Reg rate Chest: normal effort, normal rate of breathing Abd: soft, non-distended Ext: no edema Skin: Warm and dry.  Right hip with dressing CDI Psych: Normal mood.  Normal behavior. Musc: Right hip with edema and tenderness. Neuro: Alert Motor:  Right lower extremity: Hip flexion 1+/5 (pain inhibition), knee extension 4-/5 (pain inhibition), ankle dorsiflexion 4+/5, stable Left lower extremity: Hip flexion, knee extension 4-4+/5 (pain inhibition), ankle dorsiflexion 4+/5  Assessment/Plan: 1. Functional deficits which require 3+ hours per day of interdisciplinary therapy in a comprehensive inpatient rehab setting.  Physiatrist is providing close team supervision and 24 hour management of active medical problems listed below.  Physiatrist and rehab team  continue to assess barriers to discharge/monitor patient progress toward functional and medical goals   Care Tool:  Bathing    Body parts bathed by patient: Right arm,Left arm,Chest,Abdomen,Front perineal area,Right upper leg,Left upper leg,Face,Left lower leg   Body parts bathed by helper: Buttocks     Bathing assist Assist Level: Minimal Assistance - Patient > 75%     Upper Body Dressing/Undressing Upper body dressing   What is the patient wearing?: Pull over shirt    Upper body assist Assist Level: Set up assist    Lower Body Dressing/Undressing Lower body dressing      What is the patient wearing?: Pants     Lower body assist Assist for lower body dressing: Contact Guard/Touching assist     Toileting Toileting    Toileting assist Assist for toileting: Contact Guard/Touching assist     Transfers Chair/bed transfer  Transfers assist     Chair/bed transfer assist level: Contact Guard/Touching assist Chair/bed transfer assistive device: Programmer, multimedia   Ambulation assist      Assist level: Contact Guard/Touching assist Assistive device: Walker-rolling Max distance: 11ft   Walk 10 feet activity   Assist  Walk 10 feet activity did not occur: Safety/medical concerns (pain, generalized weakness, decreased balance)  Assist level: Contact Guard/Touching assist Assistive device: Walker-rolling   Walk 50 feet activity   Assist  Walk 50 feet with 2 turns activity did not occur: Safety/medical concerns  Assist level: Contact Guard/Touching assist Assistive device: Walker-rolling    Walk 150 feet activity   Assist Walk 150 feet activity did not occur: Safety/medical concerns         Walk 10 feet on uneven surface  activity   Assist Walk 10 feet on uneven surfaces activity did not occur: Safety/medical concerns   Assist level: Contact Guard/Touching assist Assistive device: Aeronautical engineer  Will patient use wheelchair at discharge?: Yes Type of Wheelchair: Manual    Wheelchair assist level: Supervision/Verbal cueing Max wheelchair distance: 155ft    Wheelchair 50 feet with 2 turns activity    Assist        Assist Level: Supervision/Verbal cueing   Wheelchair 150 feet activity     Assist      Assist Level: Supervision/Verbal cueing    Medical Problem List and Plan: 1.  Decreased functional ability secondary to right intertrochanteric hip fracture.  Status post open treatment intramedullary implant 01/04/2020.  Weightbearing as tolerated  Continue CIR 2.  Antithrombotics: -DVT/anticoagulation: Lovenox.   Creatinine 0.72 on 12/10, repeat Monday             Venous Dopplers- no evidence of clots.              -antiplatelet therapy: N/A 3. Pain Management:   Robaxin as needed, changed to scheduled on 12/9             Scheduled Tylenol, add heating pad for low back pain.   Lidoderm patch ordered.  Continue Norco prn 4. Mood: Zoloft 50 mg daily             -antipsychotic agents: N/A 5. Neuropsych: This patient is capable of making decisions on her own behalf. 6. Skin/Wound Care: continue staples/foam dressing for now 7. Fluids/Electrolytes/Nutrition: Routine in and outs  Electrolytes stable 12/10, repeat Monday 8.  Acute blood loss anemia.               Hemoglobin 7.9 on 12/6, 8.5 on 12/10, repeat Monday  Continue to monitor 9.  Hypertension.  Toprolol 25 daily  Relatively controlled on 12/9             Monitor with increased activity 10.  Drug-induced constipation.  Colace 100 mg twice daily, MiraLAX daily             Increase bowel meds as necessary 11.  Hyperglycemia-?  Stress-induced  Hemoglobin A1c 5.2 on 12/6 12.  Hypoalbuminemia  Supplement initiated on 12/2 13.  Sleep disturbance  Increased trazodone to 50mg , DC'd  Ambien 5 mg PTA, resumed-educated patient on safety, DC'd per daughter request  Remeron 15mg  started on 12/9  LOS: 9  days A FACE TO [place] 01/15/2020, 11:32 AM

## 2020-01-15 NOTE — Discharge Instructions (Signed)
Inpatient Rehab Discharge Instructions  CASSARA NIDA Discharge date and time: No discharge date for patient encounter.   Activities/Precautions/ Functional Status: Activity: activity as tolerated Diet: regular diet Wound Care: Routine skin checks Functional status:  ___ No restrictions     ___ Walk up steps independently ___ 24/7 supervision/assistance   ___ Walk up steps with assistance ___ Intermittent supervision/assistance  ___ Bathe/dress independently ___ Walk with walker     _x__ Bathe/dress with assistance ___ Walk Independently    ___ Shower independently ___ Walk with assistance    ___ Shower with assistance ___ No alcohol     ___ Return to work/school ________  Special Instructions: No driving smoking or alcohol     COMMUNITY REFERRALS UPON DISCHARGE:    Home Health:   PT, OT, [place] [phone]    Medical Equipment/Items Ordered:ROLLING WALKER AND 3 IN 1                                                 Agency/Supplier:[facility] [phone]  PRIVATE DUTY LIST GIVEN TO DAUGHTER-KATHERINE TO FOLLOW UP WITH IF PLANS TO HIRE ASSIST  My questions have been answered and I understand these instructions. I will adhere to these goals and the provided educational materials after my discharge from the hospital.  Patient/Caregiver Signature _______________________________ Date __________  Clinician Signature _______________________________________ Date __________  Please bring this form and your medication list with you to all your follow-up doctor's appointments.

## 2020-01-16 ENCOUNTER — Inpatient Hospital Stay (HOSPITAL_COMMUNITY): Payer: Medicare Other | Admitting: Physical Therapy

## 2020-01-16 NOTE — Progress Notes (Signed)
Physical Therapy Session Note  Patient Details  Name: Alison Rivera MRN: [id number] Date of Birth: 1931/05/03  Today's Date: 01/16/2020 PT Individual Time: 3794-4461 PT Individual Time Calculation (min): 59 min   Short Term Goals: Week 1:  PT Short Term Goal 1 (Week 1): pt will perform bed mobility with min A overall PT Short Term Goal 1 - Progress (Week 1): Met PT Short Term Goal 2 (Week 1): Pt will transfer bed<>WC with LRAD and CGA PT Short Term Goal 2 - Progress (Week 1): Met PT Short Term Goal 3 (Week 1): Pt will ambulate 29ft with LRAD and CGA PT Short Term Goal 3 - Progress (Week 1): Met  Skilled Therapeutic Interventions/Progress Updates:  Pt was seen bedside in the am. Pt performed multiple sit to stand and stand pivot transfers with rolling walker and c/g. Pt propelled wc 150 feet x 2 with B UEs and S. Pt ambulated 32 feet x 2 with rolling walker and c.g. Pt performed B hip flex and LAQs, 3 sets x 10 reps each. Pt performed step taps and alternating step taps 3 sets x 5 reps each. Pt returned to room following treatment and left sitting up in recliner with chair alarm on and all needs within reach.   Therapy Documentation Precautions:  Precautions Precautions: Fall Restrictions Weight Bearing Restrictions: No RLE Weight Bearing: Weight bearing as tolerated General:   Pain: No c/o pain at rest, c/o of 3/10 pain R LE with WB.  Therapy/Group: Individual Therapy  Dub Amis 01/16/2020, 12:25 PM

## 2020-01-16 NOTE — Progress Notes (Signed)
[place] PHYSICAL MEDICINE & REHABILITATION PROGRESS NOTE  Subjective/Complaints: No complaints this morning. Denies pain, constipation, insomnia.  Asks when staples may be removed. Today is day 12, advised that they may be removed on day 14.   ROS: Denies CP, SOB, N/V/D  Objective: Vital Signs: Blood pressure (!) 123/50, pulse 78, temperature 98.9 F (37.2 C), temperature source Oral, resp. rate 18, height 5\' 4"  (1.626 m), weight 57.4 kg, SpO2 96 %. No results found. Recent Labs    01/15/20 0441  WBC 5.2  HGB 8.5*  HCT 25.4*  PLT 374   Recent Labs    01/15/20 0441  NA 136  K 4.1  CL 99  CO2 27  GLUCOSE 93  BUN 16  CREATININE 0.72  CALCIUM 9.3    Intake/Output Summary (Last 24 hours) at 01/16/2020 1137 Last data filed at 01/15/2020 1825 Gross per 24 hour  Intake 500 ml  Output --  Net 500 ml        Physical Exam: BP (!) 123/50   Pulse 78   Temp 98.9 F (37.2 C) (Oral)   Resp 18   Ht 5\' 4"  (1.626 m)   Wt 57.4 kg   SpO2 96%   BMI 21.72 kg/m  Gen: no distress, normal appearing HEENT: oral mucosa pink and moist, NCAT Cardio: Reg rate Chest: normal effort, normal rate of breathing Abd: soft, non-distended Ext: no edema Skin: Warm and dry.  Right hip with dressing CDI Psych: Normal mood.  Normal behavior. Musc: Right hip with edema and tenderness. Neuro: Alert Motor:  Right lower extremity: Hip flexion 1+/5 (pain inhibition), knee extension 4-/5 (pain inhibition), ankle dorsiflexion 4+/5, stable Left lower extremity: Hip flexion, knee extension 4-4+/5 (pain inhibition), ankle dorsiflexion 4+/5  Assessment/Plan: 1. Functional deficits which require 3+ hours per day of interdisciplinary therapy in a comprehensive inpatient rehab setting.  Physiatrist is providing close team supervision and 24 hour management of active medical problems listed below.  Physiatrist and rehab team continue to assess barriers to discharge/monitor patient progress toward  functional and medical goals   Care Tool:  Bathing    Body parts bathed by patient: Right arm,Left arm,Chest,Abdomen,Front perineal area,Right upper leg,Left upper leg,Face,Left lower leg   Body parts bathed by helper: Buttocks     Bathing assist Assist Level: Minimal Assistance - Patient > 75%     Upper Body Dressing/Undressing Upper body dressing   What is the patient wearing?: Pull over shirt    Upper body assist Assist Level: Set up assist    Lower Body Dressing/Undressing Lower body dressing      What is the patient wearing?: Pants     Lower body assist Assist for lower body dressing: Contact Guard/Touching assist     Toileting Toileting    Toileting assist Assist for toileting: Contact Guard/Touching assist     Transfers Chair/bed transfer  Transfers assist     Chair/bed transfer assist level: Contact Guard/Touching assist Chair/bed transfer assistive device: Programmer, multimedia   Ambulation assist      Assist level: Contact Guard/Touching assist Assistive device: Walker-rolling Max distance: 67ft   Walk 10 feet activity   Assist  Walk 10 feet activity did not occur: Safety/medical concerns (pain, generalized weakness, decreased balance)  Assist level: Contact Guard/Touching assist Assistive device: Walker-rolling   Walk 50 feet activity   Assist Walk 50 feet with 2 turns activity did not occur: Safety/medical concerns  Assist level: Contact Guard/Touching assist Assistive device: Walker-rolling  Walk 150 feet activity   Assist Walk 150 feet activity did not occur: Safety/medical concerns         Walk 10 feet on uneven surface  activity   Assist Walk 10 feet on uneven surfaces activity did not occur: Safety/medical concerns   Assist level: Contact Guard/Touching assist Assistive device: Aeronautical engineer Will patient use wheelchair at discharge?: Yes Type of Wheelchair:  Manual    Wheelchair assist level: Supervision/Verbal cueing Max wheelchair distance: 123ft    Wheelchair 50 feet with 2 turns activity    Assist        Assist Level: Supervision/Verbal cueing   Wheelchair 150 feet activity     Assist      Assist Level: Supervision/Verbal cueing    Medical Problem List and Plan: 1.  Decreased functional ability secondary to right intertrochanteric hip fracture.  Status post open treatment intramedullary implant 01/04/2020.  Weightbearing as tolerated  Continue CIR 2.  Antithrombotics: -DVT/anticoagulation: Lovenox.   Creatinine 0.72 on 12/10, repeat Monday             Venous Dopplers- no evidence of clots.              -antiplatelet therapy: N/A 3. Pain Management:   Robaxin as needed, changed to scheduled on 12/9             Scheduled Tylenol, add heating pad for low back pain.   Lidoderm patch ordered.  Continue Norco prn 4. Mood: Zoloft 50 mg daily             -antipsychotic agents: N/A 5. Neuropsych: This patient is capable of making decisions on her own behalf. 6. Skin/Wound Care: continue staples/foam dressing for now. Discussed that they may be removed 14 days from surgery.  7. Fluids/Electrolytes/Nutrition: Routine in and outs  Electrolytes stable 12/10, repeat Monday 8.  Acute blood loss anemia.               Hemoglobin 7.9 on 12/6, 8.5 on 12/10, repeat Monday  Continue to monitor 9.  Hypertension.  Toprolol 25 daily  69/45: Systolic slightly elevated and diastolic soft- continue to monitor TID              Monitor with increased activity 10.  Drug-induced constipation.  Colace 100 mg twice daily, MiraLAX daily             Increase bowel meds as necessary 11.  Hyperglycemia-?  Stress-induced  Hemoglobin A1c 5.2 on 12/6 12.  Hypoalbuminemia  Supplement initiated on 12/2 13.  Sleep disturbance  Increased trazodone to 50mg , DC'd  Ambien 5 mg PTA, resumed-educated patient on safety, DC'd per daughter  request  Remeron 15mg  started on 12/9  LOS: 10 days A FACE TO [place] 01/16/2020, 11:37 AM

## 2020-01-17 NOTE — Progress Notes (Signed)
[place] PHYSICAL MEDICINE & REHABILITATION PROGRESS NOTE  Subjective/Complaints: No complaints this morning Advised that staples can be removed tomorrow, day 14, incisions examined and both are healing well. BP soft  ROS: Denies CP, SOB, N/V/D  Objective: Vital Signs: Blood pressure (!) 126/55, pulse 70, temperature 98.1 F (36.7 C), resp. rate 18, height 5\' 4"  (1.626 m), weight 57.4 kg, SpO2 100 %. No results found. Recent Labs    01/15/20 0441  WBC 5.2  HGB 8.5*  HCT 25.4*  PLT 374   Recent Labs    01/15/20 0441  NA 136  K 4.1  CL 99  CO2 27  GLUCOSE 93  BUN 16  CREATININE 0.72  CALCIUM 9.3    Intake/Output Summary (Last 24 hours) at 01/17/2020 1513 Last data filed at 01/16/2020 2045 Gross per 24 hour  Intake 290 ml  Output --  Net 290 ml        Physical Exam: BP (!) 126/55 (BP Location: Right Arm)   Pulse 70   Temp 98.1 F (36.7 C)   Resp 18   Ht 5\' 4"  (1.626 m)   Wt 57.4 kg   SpO2 100%   BMI 21.72 kg/m  Gen: no distress, normal appearing HEENT: oral mucosa pink and moist, NCAT Cardio: Reg rate Chest: normal effort, normal rate of breathing Abd: soft, non-distended Ext: no edema Skin: Warm and dry.  Right hip with dressing CDI Psych: Normal mood.  Normal behavior. Musc: Right hip with edema and tenderness. Neuro: Alert Motor:  Right lower extremity: Hip flexion 1+/5 (pain inhibition), knee extension 4-/5 (pain inhibition), ankle dorsiflexion 4+/5, stable Left lower extremity: Hip flexion, knee extension 4-4+/5 (pain inhibition), ankle dorsiflexion 4+/5  Assessment/Plan: 1. Functional deficits which require 3+ hours per day of interdisciplinary therapy in a comprehensive inpatient rehab setting.  Physiatrist is providing close team supervision and 24 hour management of active medical problems listed below.  Physiatrist and rehab team continue to assess barriers to discharge/monitor patient progress toward functional and medical  goals   Care Tool:  Bathing    Body parts bathed by patient: Right arm,Left arm,Chest,Abdomen,Front perineal area,Right upper leg,Left upper leg,Face,Left lower leg   Body parts bathed by helper: Buttocks     Bathing assist Assist Level: Minimal Assistance - Patient > 75%     Upper Body Dressing/Undressing Upper body dressing   What is the patient wearing?: Pull over shirt    Upper body assist Assist Level: Set up assist    Lower Body Dressing/Undressing Lower body dressing      What is the patient wearing?: Pants     Lower body assist Assist for lower body dressing: Contact Guard/Touching assist     Toileting Toileting    Toileting assist Assist for toileting: Contact Guard/Touching assist     Transfers Chair/bed transfer  Transfers assist     Chair/bed transfer assist level: Contact Guard/Touching assist Chair/bed transfer assistive device: Programmer, multimedia   Ambulation assist      Assist level: Contact Guard/Touching assist Assistive device: Walker-rolling Max distance: 32   Walk 10 feet activity   Assist  Walk 10 feet activity did not occur: Safety/medical concerns (pain, generalized weakness, decreased balance)  Assist level: Contact Guard/Touching assist Assistive device: Walker-rolling   Walk 50 feet activity   Assist Walk 50 feet with 2 turns activity did not occur: Safety/medical concerns  Assist level: Contact Guard/Touching assist Assistive device: Walker-rolling    Walk 150 feet activity  Assist Walk 150 feet activity did not occur: Safety/medical concerns         Walk 10 feet on uneven surface  activity   Assist Walk 10 feet on uneven surfaces activity did not occur: Safety/medical concerns   Assist level: Contact Guard/Touching assist Assistive device: Aeronautical engineer Will patient use wheelchair at discharge?: Yes Type of Wheelchair: Manual    Wheelchair assist  level: Supervision/Verbal cueing Max wheelchair distance: 150    Wheelchair 50 feet with 2 turns activity    Assist        Assist Level: Supervision/Verbal cueing   Wheelchair 150 feet activity     Assist      Assist Level: Supervision/Verbal cueing    Medical Problem List and Plan: 1.  Decreased functional ability secondary to right intertrochanteric hip fracture.  Status post open treatment intramedullary implant 01/04/2020.  Weightbearing as tolerated  Continue CIR 2.  Antithrombotics: -DVT/anticoagulation: Lovenox.   Creatinine 0.72 on 12/10, repeat Monday             Venous Dopplers- no evidence of clots.              -antiplatelet therapy: N/A 3. Pain Management:   Robaxin as needed, changed to scheduled on 12/9             Scheduled Tylenol, add heating pad for low back pain.   Lidoderm patch ordered.  Continue Norco prn, using sparingly.  4. Mood: Zoloft 50 mg daily             -antipsychotic agents: N/A 5. Neuropsych: This patient is capable of making decisions on her own behalf. 6. Skin/Wound Care: continue staples/foam dressing for now. Discussed that they may be removed 14 days from surgery.  7. Fluids/Electrolytes/Nutrition: Routine in and outs  Electrolytes stable 12/10, repeat Monday 8.  Acute blood loss anemia.               Hemoglobin 7.9 on 12/6, 8.5 on 12/10, repeat Monday  Continue to monitor 9.  Hypertension.  Toprolol 25 daily  12/12: BP labile- continue to monitor             Monitor with increased activity 10.  Drug-induced constipation.  Colace 100 mg twice daily, MiraLAX daily             Increase bowel meds as necessary 11.  Hyperglycemia-?  Stress-induced  Hemoglobin A1c 5.2 on 12/6 12.  Hypoalbuminemia  Supplement initiated on 12/2 13.  Sleep disturbance  Increased trazodone to 50mg , DC'd  Ambien 5 mg PTA, resumed-educated patient on safety, DC'd per daughter request  Sleeping better, continue remeron  LOS: 11 days A FACE  TO FACE EVALUATION WAS PERFORMED  Martha Clan P Katisha Shimizu 01/17/2020, 3:13 PM

## 2020-01-18 ENCOUNTER — Inpatient Hospital Stay (HOSPITAL_COMMUNITY): Payer: Medicare Other

## 2020-01-18 ENCOUNTER — Inpatient Hospital Stay (HOSPITAL_COMMUNITY): Payer: Medicare Other | Admitting: Occupational Therapy

## 2020-01-18 NOTE — Plan of Care (Signed)
  Problem: RH Stairs Goal: LTG Patient will ambulate up and down stairs w/assist (PT) Description: LTG: Patient will ambulate up and down # of stairs with assistance (PT) Outcome: Not Applicable Flowsheets (Taken 01/18/2020 0731) LTG: Pt will ambulate up/down stairs assist needed:: (D/C) -- Note: D/C   Problem: RH Balance Goal: LTG Patient will maintain dynamic standing balance (PT) Description: LTG:  Patient will maintain dynamic standing balance with assistance during mobility activities (PT) Flowsheets (Taken 01/18/2020 0731) LTG: Pt will maintain dynamic standing balance during mobility activities with:: (upgraded due to improved strength, balance, and awareness) Independent with assistive device  Note: upgraded due to improved strength, balance, and awareness   Problem: Sit to Stand Goal: LTG:  Patient will perform sit to stand with assistance level (PT) Description: LTG:  Patient will perform sit to stand with assistance level (PT) Flowsheets (Taken 01/18/2020 0731) LTG: PT will perform sit to stand in preparation for functional mobility with assistance level: (upgraded due to improved strength, balance, and awareness) Independent with assistive device Note: upgraded due to improved strength, balance, and awareness   Problem: RH Bed to Chair Transfers Goal: LTG Patient will perform bed/chair transfers w/assist (PT) Description: LTG: Patient will perform bed to chair transfers with assistance (PT). Flowsheets (Taken 01/18/2020 0731) LTG: Pt will perform Bed to Chair Transfers with assistance level: (upgraded due to improved strength, balance, and awareness) Independent with assistive device  Note: upgraded due to improved strength, balance, and awareness   Problem: RH Car Transfers Goal: LTG Patient will perform car transfers with assist (PT) Description: LTG: Patient will perform car transfers with assistance (PT). Flowsheets (Taken 01/18/2020 0731) LTG: Pt will perform car  transfers with assist:: (upgraded due to improved strength, balance, and awareness) Supervision/Verbal cueing Note: upgraded due to improved strength, balance, and awareness

## 2020-01-18 NOTE — Plan of Care (Signed)
  Problem: RH Dressing Goal: LTG Patient will perform lower body dressing w/assist (OT) Description: LTG: Patient will perform lower body dressing with assist, with/without cues in positioning using equipment (OT) Flowsheets (Taken 01/18/2020 0753) LTG: Pt will perform lower body dressing with assistance level of: (downgraded) Supervision/Verbal cueing Note: Downgraded due to requiring cues/assist for use of AE during LB dressing   Problem: RH Toilet Transfers Goal: LTG Patient will perform toilet transfers w/assist (OT) Description: LTG: Patient will perform toilet transfers with assist, with/without cues using equipment (OT) Flowsheets (Taken 01/18/2020 0753) LTG: Pt will perform toilet transfers with assistance level of: (downgraded) Supervision/Verbal cueing Note: Downgraded as pt will require supervision for ambulatory transfers

## 2020-01-18 NOTE — Progress Notes (Signed)
Patient ID: Alison Rivera, female   DOB: [birth year], 86 y.o.   MRN: [id number] Met with husband who is complaining of demand for them to have a ramp. He is a Chief Financial Officer and feels he can get her up the stairs at home based upon his background. He does not feel it can be demanded of them to do this. He asked again who this worker was and why am involved. He is here daily and stays for long periods of time unsure if this is helpful for the pt.

## 2020-01-18 NOTE — Progress Notes (Signed)
Physical Therapy Session Note  Patient Details  Name: Alison Rivera MRN: [id number] Date of Birth: Sep 13, 1931  Today's Date: 01/18/2020 PT Individual Time: 1100-1155 and 1445-1524 PT Individual Time Calculation (min): 55 min and 39 min  Short Term Goals: Week 1:  PT Short Term Goal 1 (Week 1): pt will perform bed mobility with min A overall PT Short Term Goal 1 - Progress (Week 1): Met PT Short Term Goal 2 (Week 1): Pt will transfer bed<>WC with LRAD and CGA PT Short Term Goal 2 - Progress (Week 1): Met PT Short Term Goal 3 (Week 1): Pt will ambulate 7ft with LRAD and CGA PT Short Term Goal 3 - Progress (Week 1): Met Week 2:  PT Short Term Goal 1 (Week 2): STG=LTG due to LOS  Skilled Therapeutic Interventions/Progress Updates:   Treatment Session 1: 1100-1155 55 min Received pt sitting in [place] with husband present at bedside, pt agreeable to therapy, and reported low pain level in R hip but did not state level (premedicated). Repositioning, rest breaks, and distraction done to reduce pain levels. Session with emphasis on functional mobility/transfers, generalized strengthening, dynamic standing balance/coordination, ambulation, stair navigation, and improved activity tolerance. Pt transported to therapy gym in [facility] total A for time management purposes and navigated 6 steps with 2 rails and min A ascending and descending with a step to pattern with cues for "up with the good, down with the bad" technique, and 2 steps with 1 rail and mod A using a lateral stepping technique. Pt with heavy reliance on rail for UE support. Pt transported to dayroom in [facility] total A and ambulated 175ft with RW and close supervision. Pt with increased cadence and improved weight shifting to RLE. Pt transferred sit<>stand with RW and supervision and performed the following exercises standing with BUE support and CGA for balance: -marching x10 bilaterally with 1lb ankle weight -mini-squats 2x10 -hip abduction x5 bilaterally  with 1lb ankle weight (limited by pain) -hamstring curls with 1lb ankle weight x8 bilaterally Pt performed BLE strengthening on Kinetron at 30cm/sec for 1 minute x 1 trial and 50cm/sec for 1 minute x 2 trials without back support with emphasis on trunk control and core strengthening. Pt transported back to room in [facility] total A and transferred WC<>recliner with RW and supervision. Concluded session with pt sitting in recliner, needs within reach, and seatbelt alarm on. Husband present at bedside.   Treatment Session 2: 1445-1524 39 min Received pt sitting in recliner with husband present at bedside, pt agreeable to therapy, and did not state pain level during session. Session with emphasis on family education, functional mobility/transfers, generalized strengthening, dynamic standing balance/coordination, ambulation, simulated car transfers, stair navigation, and improved activity tolerance. Pt transferred recliner<>WC stand<>pivot with RW and CGA and transported to ortho gym in [facility] total A. Pt performed ambulatory simulated car transfer with RW and CGA with cues for turning technique. Pt then ambulated 6ft on uneven surfaces (ramp) with RW and supervision. Educated pt's husband on recommendation to provide supervision but that pt does not require any physical assist. Pt transported to therapy gym and navigated 3 steps with 2 rails and min A x 2 trials ascending and descending with a step to pattern. Trial 1 with therapist and trial 2 with pt's husband. Pt then navigated 2 steps with 1 handrail and mod A using a lateral stepping technique with therapist. Attempted this with husband but pt requested to stop due to reports of not feeling safe. Educated pt's husband again  on therapist's reasoning for recommending a ramp for safety and improved independence for pt, as pt's husband telling therapist multiple times that he could have 1 rail installed. However, pt has verbalized agreement that ramp would be the safest  and easiest method for getting in/out of the house. Pt then ambulated 19ft with RW and close supervision provided by pt's husband back to room. Therapist educated pt's husband on need to be far enough away not to step on pt's feet and risk her falling but close enough to provide support in case of LOB. Concluded session with pt sitting in recliner, needs within reach, and seatbelt alarm on. Therapist provided fresh ice water for pt.   Therapy Documentation Precautions:  Precautions Precautions: Fall Restrictions Weight Bearing Restrictions: No RLE Weight Bearing: Weight bearing as tolerated  Therapy/Group: Individual Therapy [name] PT, DPT   01/18/2020, 7:26 AM

## 2020-01-18 NOTE — Progress Notes (Signed)
[place] PHYSICAL MEDICINE & REHABILITATION PROGRESS NOTE  Subjective/Complaints: Patient seen standing up this morning, performing ADLs.  She states she slept well overnight.  She states she had a good weekend.  She states she is getting stronger. She notes improvement in pain.  ROS: Denies CP, SOB, N/V/D  Objective: Vital Signs: Blood pressure (!) 150/61, pulse 66, temperature 98.4 F (36.9 C), temperature source Oral, resp. rate 18, height 5\' 4"  (1.626 m), weight 57.4 kg, SpO2 99 %. No results found. No results for input(s): WBC, HGB, HCT, PLT in the last 72 hours. No results for input(s): NA, K, CL, CO2, GLUCOSE, BUN, CREATININE, CALCIUM in the last 72 hours.  Intake/Output Summary (Last 24 hours) at 01/18/2020 0827 Last data filed at 01/18/2020 0803 Gross per 24 hour  Intake 1460 ml  Output --  Net 1460 ml        Physical Exam: BP (!) 150/61 (BP Location: Left Arm)   Pulse 66   Temp 98.4 F (36.9 C) (Oral)   Resp 18   Ht 5\' 4"  (1.626 m)   Wt 57.4 kg   SpO2 99%   BMI 21.72 kg/m  Constitutional: No distress . Vital signs reviewed. HENT: Normocephalic.  Atraumatic. Eyes: EOMI. No discharge. Cardiovascular: No JVD.  RRR. Respiratory: Normal effort.  No stridor.  Bilateral clear to auscultation. GI: Non-distended.  BS +. Skin: Warm and dry.  Right hip with dressing CDI Psych: Normal mood.  Normal behavior. Musc: Right hip with improving edema and tenderness Neuro: Alert Motor:  Right lower extremity: Hip flexion 2+/5 (pain inhibition), knee extension 4-/5 (pain inhibition), ankle dorsiflexion 4+/5. Left lower extremity: Hip flexion, knee extension 4-4+/5 (pain inhibition), ankle dorsiflexion 4+/5  Assessment/Plan: 1. Functional deficits which require 3+ hours per day of interdisciplinary therapy in a comprehensive inpatient rehab setting.  Physiatrist is providing close team supervision and 24 hour management of active medical problems listed  below.  Physiatrist and rehab team continue to assess barriers to discharge/monitor patient progress toward functional and medical goals   Care Tool:  Bathing    Body parts bathed by patient: Right arm,Left arm,Chest,Abdomen,Front perineal area,Right upper leg,Left upper leg,Face,Left lower leg   Body parts bathed by helper: Buttocks     Bathing assist Assist Level: Minimal Assistance - Patient > 75%     Upper Body Dressing/Undressing Upper body dressing   What is the patient wearing?: Pull over shirt    Upper body assist Assist Level: Set up assist    Lower Body Dressing/Undressing Lower body dressing      What is the patient wearing?: Pants     Lower body assist Assist for lower body dressing: Contact Guard/Touching assist     Toileting Toileting    Toileting assist Assist for toileting: Contact Guard/Touching assist     Transfers Chair/bed transfer  Transfers assist     Chair/bed transfer assist level: Contact Guard/Touching assist Chair/bed transfer assistive device: Programmer, multimedia   Ambulation assist      Assist level: Contact Guard/Touching assist Assistive device: Walker-rolling Max distance: 32   Walk 10 feet activity   Assist  Walk 10 feet activity did not occur: Safety/medical concerns (pain, generalized weakness, decreased balance)  Assist level: Contact Guard/Touching assist Assistive device: Walker-rolling   Walk 50 feet activity   Assist Walk 50 feet with 2 turns activity did not occur: Safety/medical concerns  Assist level: Contact Guard/Touching assist Assistive device: Walker-rolling    Walk 150 feet activity  Assist Walk 150 feet activity did not occur: Safety/medical concerns         Walk 10 feet on uneven surface  activity   Assist Walk 10 feet on uneven surfaces activity did not occur: Safety/medical concerns   Assist level: Contact Guard/Touching assist Assistive device:  Aeronautical engineer Will patient use wheelchair at discharge?: Yes Type of Wheelchair: Manual    Wheelchair assist level: Supervision/Verbal cueing Max wheelchair distance: 150    Wheelchair 50 feet with 2 turns activity    Assist        Assist Level: Supervision/Verbal cueing   Wheelchair 150 feet activity     Assist      Assist Level: Supervision/Verbal cueing    Medical Problem List and Plan: 1.  Decreased functional ability secondary to right intertrochanteric hip fracture.  Status post open treatment intramedullary implant 01/04/2020.  Weightbearing as tolerated  Continue CIR 2.  Antithrombotics: -DVT/anticoagulation: Lovenox.    Creatinine 0.72 on 12/10              Venous Dopplers- no evidence of clots.              -antiplatelet therapy: N/A 3. Pain Management:   Robaxin as needed, changed to scheduled on 12/9             Scheduled Tylenol, add heating pad for low back pain.   Lidoderm patch ordered.  Continue Norco prn, using sparingly  Controlled with meds on 12/13.  4. Mood: Zoloft 50 mg daily             -antipsychotic agents: N/A 5. Neuropsych: This patient is capable of making decisions on her own behalf. 6. Skin/Wound Care: continue staples/foam dressing for now. Discussed that they may be removed 14 days from surgery.  7. Fluids/Electrolytes/Nutrition: Routine in and outs  Electrolytes stable 12/10, labs ordered for tomorrow 8.  Acute blood loss anemia.               Hemoglobin 8.5 on 12/10, labs ordered for tomorrow  Continue to monitor 9.  Hypertension.  Toprolol 25 daily  12/12: BP labile- continue to monitor             Monitor with increased activity 10.  Drug-induced constipation.  Colace 100 mg twice daily, MiraLAX daily             Increase bowel meds as necessary 11.  Hyperglycemia-?  Stress-induced  Hemoglobin A1c 5.2 on 12/6 12.  Hypoalbuminemia  Supplement initiated on 12/2 13.  Sleep  disturbance  Increased trazodone to 50mg , DC'd  Ambien 5 mg PTA, resumed-educated patient on safety, DC'd per daughter request  Continue Remeron  Improving  LOS: 12 days A FACE TO FACE EVALUATION WAS PERFORMED  Alison Rivera Lorie Phenix 01/18/2020, 8:27 AM

## 2020-01-18 NOTE — Progress Notes (Signed)
Occupational Therapy Session Note  Patient Details  Name: Alison Rivera MRN: [id number] Date of Birth: March 03, 1931  Today's Date: 01/18/2020 OT Individual Time: 1305-1350 OT Individual Time Calculation (min): 45 min    Short Term Goals: Week 2:  OT Short Term Goal 1 (Week 2): STG = LTGs due to remaining LOS  Skilled Therapeutic Interventions/Progress Updates:    Treatment session with focus on dynamic standing balance and weight bearing through RLE.  Pt received upright in recliner agreeable to therapy session. Engaged in balance activities at high-low table for UE support.  Engaged in mini squats 10 x3, sideways walking x3 to increase WB through RLE, and then forward/backward walking with only LUE on table - pt unable to complete with only LUE support therefore completed with HHA from therapist on Rt.  Engaged in alternating toe taps on 3" step 10 x2 with RW for UE support.  Pt required seated rest breaks between each exercise.  Ambulated 30' with RW back to recliner in room with CGA.  Pt remained upright in recliner with legs elevated, seat belt alarm on, and all needs in reach.  Therapy Documentation Precautions:  Precautions Precautions: Fall Restrictions Weight Bearing Restrictions: No RLE Weight Bearing: Weight bearing as tolerated General:   Vital Signs: Therapy Vitals Temp: 98.7 F (37.1 C) Pulse Rate: 73 Resp: 16 BP: (!) 112/57 Patient Position (if appropriate): Sitting Oxygen Therapy SpO2: 100 % O2 Device: Room Air Pain:  Pt with c/o pain 5/10 with activity, no pain at rest.   Therapy/Group: Individual Therapy  Simonne Come 01/18/2020, 3:10 PM

## 2020-01-18 NOTE — Progress Notes (Signed)
Occupational Therapy Session Note  Patient Details  Name: Alison Rivera MRN: [id number] Date of Birth: [birth year]  Today's Date: 01/18/2020 OT Individual Time: 9030-0923 OT Individual Time Calculation (min): 60 min    Short Term Goals: Week 2:  OT Short Term Goal 1 (Week 2): STG = LTGs due to remaining LOS  Skilled Therapeutic Interventions/Progress Updates:    Treatment session with focus on self-care retraining, functional transfers, and use of AE for increased independence with LB dressing.  Pt received upright in recliner awaiting therapist for shower, as previously discussed during therapy session Friday.  Therapist gathered clothing prior to shower.  Pt completed sit >stand from recliner with CGA and ambulated to room shower with RW with CGA.  Therapist covered incision prior to shower.  Pt completed shower transfer with min cues for technique and CGA during transfer.  Pt completed bathing at overall CGA.  Pt returned to room to complete dressing.  Pt donned bra in standing with CGA.  Pt completed LB dressing at sit > stand level from EOB with use of reacher to thread RLE in to underwear and pants.  Pt engaged in oral care and drying hair while seated at sink.  Therapist donned TEDS for time management.  Pt remained seated at sink to style hair, husband arriving at end of session.    Therapy Documentation Precautions:  Precautions Precautions: Fall Restrictions Weight Bearing Restrictions: No RLE Weight Bearing: Weight bearing as tolerated Pain: Pain Assessment Pain Scale: 0-10 Pain Score: 0-No pain Faces Pain Scale: No hurt Pain Type: Acute pain Pain Location: Hip Pain Orientation: Right   Therapy/Group: Individual Therapy  Simonne Come 01/18/2020, 11:46 AM

## 2020-01-19 ENCOUNTER — Inpatient Hospital Stay (HOSPITAL_COMMUNITY): Payer: Medicare Other | Admitting: Occupational Therapy

## 2020-01-19 ENCOUNTER — Inpatient Hospital Stay (HOSPITAL_COMMUNITY): Payer: Medicare Other

## 2020-01-19 DIAGNOSIS — E871 Hypo-osmolality and hyponatremia: Secondary | ICD-10-CM

## 2020-01-19 LAB — CBC WITH DIFFERENTIAL/PLATELET
Abs Immature Granulocytes: 0.03 10*3/uL (ref 0.00–0.07)
Basophils Absolute: 0 10*3/uL (ref 0.0–0.1)
Basophils Relative: 1 %
Eosinophils Absolute: 0.1 10*3/uL (ref 0.0–0.5)
Eosinophils Relative: 1 %
HCT: 28.8 % — ABNORMAL LOW (ref 36.0–46.0)
Hemoglobin: 9.6 g/dL — ABNORMAL LOW (ref 12.0–15.0)
Immature Granulocytes: 1 %
Lymphocytes Relative: 22 %
Lymphs Abs: 1.2 10*3/uL (ref 0.7–4.0)
MCH: 31.9 pg (ref 26.0–34.0)
MCHC: 33.3 g/dL (ref 30.0–36.0)
MCV: 95.7 fL (ref 80.0–100.0)
Monocytes Absolute: 0.6 10*3/uL (ref 0.1–1.0)
Monocytes Relative: 12 %
Neutro Abs: 3.3 10*3/uL (ref 1.7–7.7)
Neutrophils Relative %: 63 %
Platelets: 411 10*3/uL — ABNORMAL HIGH (ref 150–400)
RBC: 3.01 MIL/uL — ABNORMAL LOW (ref 3.87–5.11)
RDW: 16 % — ABNORMAL HIGH (ref 11.5–15.5)
WBC: 5.1 10*3/uL (ref 4.0–10.5)
nRBC: 0 % (ref 0.0–0.2)

## 2020-01-19 LAB — BASIC METABOLIC PANEL
Anion gap: 8 (ref 5–15)
BUN: 22 mg/dL (ref 8–23)
CO2: 28 mmol/L (ref 22–32)
Calcium: 9.4 mg/dL (ref 8.9–10.3)
Chloride: 98 mmol/L (ref 98–111)
Creatinine, Ser: 0.8 mg/dL (ref 0.44–1.00)
GFR, Estimated: >60 mL/min (ref >60)
Glucose, Bld: 97 mg/dL (ref 70–99)
Potassium: 4.1 mmol/L (ref 3.5–5.1)
Sodium: 134 mmol/L — ABNORMAL LOW (ref 135–145)

## 2020-01-19 NOTE — Progress Notes (Signed)
Physical Therapy Session Note  Patient Details  Name: Alison Rivera MRN: [id number] Date of Birth: 1931-09-10  Today's Date: 01/19/2020 PT Individual Time: 1000-1055 and 1300-1410  PT Individual Time Calculation (min): 55 min and 70 min  Short Term Goals: Week 1:  PT Short Term Goal 1 (Week 1): pt will perform bed mobility with min A overall PT Short Term Goal 1 - Progress (Week 1): Met PT Short Term Goal 2 (Week 1): Pt will transfer bed<>WC with LRAD and CGA PT Short Term Goal 2 - Progress (Week 1): Met PT Short Term Goal 3 (Week 1): Pt will ambulate 60f with LRAD and CGA PT Short Term Goal 3 - Progress (Week 1): Met Week 2:  PT Short Term Goal 1 (Week 2): STG=LTG due to LOS  Skilled Therapeutic Interventions/Progress Updates:   Treatment Session 1: 1000-1055 55 min Received pt sitting in WC, pt agreeable to therapy, and reported pain 6/10 in R hip increasing to 10/10. RN notified and present to administer pain medication during session. Repositioning, rest breaks, and distraction done to reduce pain levels. Session with emphasis on functional mobility/transfers, generalized strengthening, dynamic standing balance/coordinaiton, ambulation, and improved activity tolerance. Therapist adjusted pt's new RW and pt performed WC mobility 1534fusing BUE and supervision to therapy gym and ambulated 8080fith RW and close supervision. Pt demonstrated antalgic gait pattern today more than normal due to increased reports of R hip pain. Pt able to stand and pick up cone from floor using RW and CGA. Pt transferred sit<>supine with supervision and performed the following exercises supine on wedge with verbal/tactile cues for technique: -heel slides x10 bilaterally (AAROM on RLE) -hip abduction x10 bilaterally (AAROM on RLE) -SLR x10 bilaterally (AAROM on RLE) -bridges x10 -SAQ x10 bilaterally  -hip adduction ball squeezes x10 Pt transferred supine<>sitting on mat with supervision and ambulated [id number] RW and supervision back to WC. Pt [facility] and ambulated 5ft 48fh RW and supervision to recliner. Concluded session with pt sitting in recliner, needs within reach, and seatbelt alarm on.   Treatment Session 2: 1300-1410 70 min Received pt sitting in recliner with husband present at bedside, pt agreeable to therapy, and denied any pain during session. Session with emphasis on functional mobility/transfers, generalized strengthening, dynamic standing balance/coordinaiton, ambulation, toileting, and improved activity tolerance. Pt reported urge to use restroom and ambulated 12ft 64ftrials with RW and supervision to/from bathroom. Pt able to manage clothing and void Mod I. Pt stood at sink and washed hands mod I. Pt performed WC mobility 100ft u8f BUE and supervision to ortho gym and transferred WC<> Nustep stand<>pivot with RW and supervision and performed BUE/LE strengthening on Nustep at workload 4 for 12 minutes for a total of 513 steps for improved cardiovascular endurance and R hip ROM. [facility] and ambulated 180ft [id number] and supervision with improvements in weight shifting to R. Engaged in dynamic standing balance Wii bowling for 8/10 rounds (approximately 15-20 minutes) standing without AD and CGA for balance with emphasis on equal weight distribution. Completed final 2 rounds sitting in WC due to fatigue. Pt performed WC mobility 150ft [id number] and supervision back to room with cues for technique. Concluded session with pt sitting in WC, needs within reach, and seatbelt alarm on. Husband present at bedside.   Therapy Documentation Precautions:  Precautions Precautions: Fall Restrictions Weight Bearing Restrictions: No RLE Weight Bearing: Weight bearing as  tolerated   Therapy/Group: Individual Therapy [name] PT, DPT   01/19/2020, 7:25 AM

## 2020-01-19 NOTE — Progress Notes (Signed)
Occupational Therapy Session Note  Patient Details  Name: Alison Rivera MRN: [id number] Date of Birth: 1931-12-18  Today's Date: 01/19/2020 OT Individual Time: 3143-8887 OT Individual Time Calculation (min): 75 min    Short Term Goals: Week 2:  OT Short Term Goal 1 (Week 2): STG = LTGs due to remaining LOS  Skilled Therapeutic Interventions/Progress Updates:    Treatment session with focus on self-care retraining, use of AE for LB dressing, and dynamic standing balance.  Pt received in bathroom on toilet upon arrival.  Pt requested more time to complete toileting needs.  Therapist gathered AE for LB dressing while pt completed toileting needs.  Pt completed ambulatory transfer to w/ with RW with close supervision.  Pt engaged in dressing at Mod I level with pt gathering clothing items from w/c level and donning UB clothing at Mod I level.  Pt required supervision/cues for use of AE with LB dressing, pt then able to don pants with close supervision at sit > stand level and cues for technique with use of sock aid and shoe funnel.  Therapist assisted with donning TEDS and fastening Right shoe.  Discussed purpose of TEDS. Engaged in dynamic standing balance at high-low table with focus on weight shifting and increasing weight bearing through RLE while engaging in table top jigsaw puzzle.  Pt demonstrating increased weight shifting and tolerance of weight through RLE. Pt even reporting increased fatigue and task, but pleased with ability to complete with minimal pain.  Pt ambulated 150' with RW with supervision back to room.  Pt remained upright in w/c with seat belt alarm on and all needs in reach.  Therapy Documentation Precautions:  Precautions Precautions: Fall Restrictions Weight Bearing Restrictions: No RLE Weight Bearing: Weight bearing as tolerated Pain:  Pt with no c/o pain   Therapy/Group: Individual Therapy  Simonne Come 01/19/2020, 8:58 AM

## 2020-01-19 NOTE — Discharge Summary (Addendum)
Physician Discharge Summary  Patient ID: Alison Rivera MRN: [id number] DOB/AGE: 1931-12-29 86 y.o.  Admit date: 01/06/2020 Discharge date: 01/21/2020  Discharge Diagnoses:  Principal Problem:   Intertrochanteric fracture of right hip ([place]) Active Problems:   Hypoalbuminemia due to protein-calorie malnutrition (HCC)   Hyperglycemia   Sleep disturbance   Labile blood pressure   Hyponatremia   History of open reduction and internal fixation (ORIF) procedure Acute blood loss anemia Drug-induced constipation Osteoporosis Mitral valve prolapse  Discharged Condition: Stable  Significant Diagnostic Studies: DG Chest 1 View  Result Date: 01/04/2020 CLINICAL DATA:  Fall EXAM: CHEST  1 VIEW COMPARISON:  02/26/2005 FINDINGS: The lungs are symmetrically hyperinflated in keeping with changes of COPD. No superimposed confluent pulmonary infiltrate. No pneumothorax or pleural effusion. Cardiac size is at the upper limits of normal, stable since prior examination. Pulmonary vascularity is normal. No acute bone abnormality. IMPRESSION: No active disease.  COPD. Electronically Signed   By: Fidela Salisbury MD   On: 01/04/2020 04:02   Pelvis Portable  Result Date: 01/04/2020 CLINICAL DATA:  Intramedullary nail placement for fracture EXAM: PORTABLE PELVIS 1-2 VIEWS COMPARISON:  Intraoperative radiographs right hip January 04, 2020; preoperative pelvis and right hip images January 04, 2020 FINDINGS: Frontal view obtained. There is screw and nail fixation through an intertrochanteric femur fracture on the right with alignment essentially anatomic at the fracture site. Screw tips in proximal right femur. Nail remains within intramedullary portion of bone throughout its course. No new fracture. No dislocation. Narrowing right hip joint is stable. Bony overgrowth along the superolateral aspect of the right acetabulum present. IMPRESSION: Screw and nail fixation through fracture of the intertrochanteric  proximal right femur with alignment essentially anatomic in the area of fracture on frontal view. Screw tips in proximal femur. Narrowing right hip joint. Bony overgrowth along the superolateral aspect of the right acetabulum potentially places patient at increased risk for femoroacetabular impingement. Electronically Signed   By: Lowella Grip III M.D.   On: 01/04/2020 17:21   DG C-Arm 1-60 Min  Result Date: 01/04/2020 CLINICAL DATA:  Fixation right hip fracture. EXAM: OPERATIVE right HIP (WITH PELVIS IF PERFORMED) 2 VIEWS TECHNIQUE: Fluoroscopic spot image(s) were submitted for interpretation post-operatively. COMPARISON:  Earlier same day. FINDINGS: Examination demonstrates placement of right femoral intramedullary nail with 2 associated screws bridging the femoral neck into the femoral head fixating patient's intertrochanteric fracture. Hardware is intact with anatomic alignment over the fracture site. IMPRESSION: Expected changes post fixation of right intertrochanteric hip fracture. Electronically Signed   By: Marin Olp M.D.   On: 01/04/2020 16:34   DG HIP OPERATIVE UNILAT W OR W/O PELVIS RIGHT  Result Date: 01/04/2020 CLINICAL DATA:  Fixation right hip fracture. EXAM: OPERATIVE right HIP (WITH PELVIS IF PERFORMED) 2 VIEWS TECHNIQUE: Fluoroscopic spot image(s) were submitted for interpretation post-operatively. COMPARISON:  Earlier same day. FINDINGS: Examination demonstrates placement of right femoral intramedullary nail with 2 associated screws bridging the femoral neck into the femoral head fixating patient's intertrochanteric fracture. Hardware is intact with anatomic alignment over the fracture site. IMPRESSION: Expected changes post fixation of right intertrochanteric hip fracture. Electronically Signed   By: Marin Olp M.D.   On: 01/04/2020 16:34   DG HIP UNILAT WITH PELVIS 2-3 VIEWS RIGHT  Result Date: 01/18/2020 CLINICAL DATA:  ORIF EXAM: DG HIP (WITH OR WITHOUT PELVIS) 2-3V  RIGHT COMPARISON:  01/06/2020 FINDINGS: Changes of internal fixation across the proximal right femoral fracture. No change in alignment. No hardware complicating feature.  IMPRESSION: No change in the postoperative appearance of the right femur. Electronically Signed   By: Rolm Baptise M.D.   On: 01/18/2020 20:13   DG HIP UNILAT WITH PELVIS 2-3 VIEWS RIGHT  Result Date: 01/06/2020 CLINICAL DATA:  Onset pain after the patient felt a pop today. The patient suffered a right intertrochanteric fracture in a fall 01/04/2020 and underwent internal fixation that same day. EXAM: DG HIP (WITH OR WITHOUT PELVIS) 2-3V RIGHT COMPARISON:  Plain films of the pelvis and right hip 01/04/2020. FINDINGS: There is no acute bony or joint abnormality. Fixation hardware for the patient's intertrochanteric fracture is intact and normal in appearance. Position and alignment of the fracture are unchanged. Attenuated appearance of the inferior pubic rami bilaterally is also unchanged. IMPRESSION: No acute abnormality. Status post fixation of a right intertrochanteric fracture. The appearance is unchanged. Electronically Signed   By: Inge Rise M.D.   On: 01/06/2020 14:35   DG Hip Unilat With Pelvis 2-3 Views Right  Result Date: 01/04/2020 CLINICAL DATA:  Fall from toilet with right hip pain, initial encounter EXAM: DG HIP (WITH OR WITHOUT PELVIS) 3V RIGHT COMPARISON:  None. FINDINGS: There is an intratrochanteric fracture of the proximal right femur with impaction and angulation at the fracture site. Femoral head is well seated. Pelvic ring is intact. No soft tissue abnormality is noted. IMPRESSION: Intratrochanteric fracture of the right femur. Electronically Signed   By: Inez Catalina M.D.   On: 01/04/2020 04:02   VAS [place] LOWER EXTREMITY VENOUS (DVT)  Result Date: 01/08/2020  Lower Venous DVT Study Indications: Swelling, post-op RT hip.  Risk Factors: Surgery 01-04-2020 Intramedullary nail intertrochantric RT hip.  Anticoagulation: Lovenox. Comparison Study: No prior studies. Performing Technologist: Darlin Coco, RDMS  Examination Guidelines: A complete evaluation includes B-mode imaging, spectral Doppler, color Doppler, and power Doppler as needed of all accessible portions of each vessel. Bilateral testing is considered an integral part of a complete examination. Limited examinations for reoccurring indications may be performed as noted. The reflux portion of the exam is performed with the patient in reverse Trendelenburg.  +---------+---------------+---------+-----------+----------+--------------+ RIGHT    CompressibilityPhasicitySpontaneityPropertiesThrombus Aging +---------+---------------+---------+-----------+----------+--------------+ CFV      Full           Yes      Yes                                 +---------+---------------+---------+-----------+----------+--------------+ SFJ      Full                                                        +---------+---------------+---------+-----------+----------+--------------+ FV Prox  Full                                                        +---------+---------------+---------+-----------+----------+--------------+ FV Mid   Full                                                        +---------+---------------+---------+-----------+----------+--------------+  FV DistalFull                                                        +---------+---------------+---------+-----------+----------+--------------+ PFV      Full                                                        +---------+---------------+---------+-----------+----------+--------------+ POP      Full           Yes      Yes                                 +---------+---------------+---------+-----------+----------+--------------+ PTV      Full                                                         +---------+---------------+---------+-----------+----------+--------------+ PERO     Full                                                        +---------+---------------+---------+-----------+----------+--------------+   +---------+---------------+---------+-----------+----------+--------------+ LEFT     CompressibilityPhasicitySpontaneityPropertiesThrombus Aging +---------+---------------+---------+-----------+----------+--------------+ CFV      Full           Yes      Yes                                 +---------+---------------+---------+-----------+----------+--------------+ SFJ      Full                                                        +---------+---------------+---------+-----------+----------+--------------+ FV Prox  Full                                                        +---------+---------------+---------+-----------+----------+--------------+ FV Mid   Full                                                        +---------+---------------+---------+-----------+----------+--------------+ FV DistalFull                                                        +---------+---------------+---------+-----------+----------+--------------+  PFV      Full                                                        +---------+---------------+---------+-----------+----------+--------------+ POP      Full           Yes      Yes                                 +---------+---------------+---------+-----------+----------+--------------+ PTV      Full                                                        +---------+---------------+---------+-----------+----------+--------------+ PERO     Full                                                        +---------+---------------+---------+-----------+----------+--------------+     Summary: RIGHT: - There is no evidence of deep vein thrombosis in the lower extremity.  - No cystic structure found in  the popliteal fossa.  LEFT: - There is no evidence of deep vein thrombosis in the lower extremity.  - No cystic structure found in the popliteal fossa.  *See table(s) above for measurements and observations. Electronically signed by Jamelle Haring on 01/08/2020 at 1:42:53 PM.    Final     Labs:  Basic Metabolic Panel: Recent Labs  Lab 01/15/20 0441 01/19/20 0451 01/20/20 0416  NA 136 134*  --   K 4.1 4.1  --   CL 99 98  --   CO2 27 28  --   GLUCOSE 93 97  --   BUN 16 22  --   CREATININE 0.72 0.80 0.84  CALCIUM 9.3 9.4  --     CBC: Recent Labs  Lab 01/15/20 0441 01/19/20 0451  WBC 5.2 5.1  NEUTROABS 3.2 3.3  HGB 8.5* 9.6*  HCT 25.4* 28.8*  MCV 94.8 95.7  PLT 374 411*    CBG: No results for input(s): GLUCAP in the last 168 hours.  Family history.  Father with CHF Brother with Parkinson's disease.  Denies any colon cancer esophageal cancer or rectal cancer  Brief HPI:   Alison Rivera is a 86 y.o. right-handed female with history of mitral valve prolapse osteoporosis and hypertension.  Lives with spouse two-level home bed and bath main level 2 steps to entry.  Independent prior to admission.  Husband can assist as well as a daughter nearby.  Presented 01/04/2020 after falling asleep on the toilet when she fell landing on her right side.  No loss of consciousness.  Sustained right intertrochanteric hip fracture.  Admission chemistries unremarkable except calcium 7.1 hemoglobin 10.7.  Underwent open treatment of intertrochanteric fracture with intramedullary implant on 01/04/2020 per Dr.Xu.  Weightbearing as tolerated.  Lovenox for DVT prophylaxis.  Hospital course further complicated by acute blood loss anemia 8.0 and monitored.  Therapy evaluations completed and patient was admitted  for a comprehensive rehab program.   Hospital Course: DEMETRIA IWAI was admitted to rehab 01/06/2020 for inpatient therapies to consist of PT, ST and OT at least three hours five days a week. Past  admission physiatrist, therapy team and rehab RN have worked together to provide customized collaborative inpatient rehab.  Pertaining to patient's right intertrochanteric hip fracture status post open treatment intramedullary implant 01/04/2020 weightbearing as tolerated neurovascular sensation intact.  Follow-up films 01/18/2020 no change in postoperative appearance of the right femur.  Patient follow-up orthopedic services.  Maintained on Lovenox during hospital stay venous Doppler studies negative.  Pain management maintained on Robaxin as needed as well as Norco.  Lidoderm patch have been ordered.  Mood stabilization with Zoloft she was attending full therapies.  Acute blood loss anemia latest hemoglobin 9.6.  No bleeding episodes.  Blood pressure remained controlled on Toprol.  Patient would follow-up outpatient.  Drug-induced constipation resolved with laxative assistance.  She did have some sleep difficulty currently maintained on Remeron.  She had also been on Ambien prior to admission.   Blood pressures were monitored on TID basis and controlled     Rehab course: During patient's stay in rehab weekly team conferences were held to monitor patient's progress, set goals and discuss barriers to discharge. At admission, patient required min mod assist ambulate 12 feet rolling walker moderate assist sit to stand moderate assist supine to sit.  Supervision upper body bathing max is lower body bathing minimal assist upper body dressing total assist lower body dressing  Physical exam.  Blood pressure 119/47 pulse 72 temperature 92 respirations 16 oxygen saturations 90% room air Constitutional.  No acute distress HEENT Head.  Normocephalic and atraumatic Neck.  Supple nontender no JVD without thyromegaly Cardiac regular rate rhythm without extra sounds or murmur heard Abdomen.  Soft nontender positive bowel sounds not rebound Respiratory effort normal no respiratory distress without wheeze Skin.   Right hip incision clean and dry Neurologic.  Alert oriented Motor.  Bilateral upper extremities 5/5 proximal distal Right lower extremity hip flexion knee extension 1+/5 pain inhibition ankle dorsiflexion 4+/5 Left lower extremity hip flexion knee extension 4 -/5 pain inhibition ankle dorsiflexion 4/5 sensation intact  /She  has had improvement in activity tolerance, balance, postural control as well as ability to compensate for deficits. Marlana Salvage has had improvement in functional use RUE/LUE  and RLE/LLE as well as improvement in awareness.  Sessions emphasis on functional mobility transfers general strengthening dynamic standing balance coordination ambulation.  Navigated 6 steps to rails minimal assist ascending and descending.  Ambulates 130 feet rolling walker close supervision.  Patient transferred recliner wheelchair stand pivot with rolling walker contact-guard assist.  Patient perform ambulated simulated car transfers rolling walker contact-guard assist.  Patient completed sit to stand ADLs from recliner contact-guard assist ambulates to the room shower with rolling walker contact-guard assist.  Patient completed shower transfers with minimal cues for technique contact-guard assist during transfers.  Patient completed bathing overall contact-guard assist.  Patient donned Abrol and standing with contact-guard.  Completed lower body dressing at sit to stand level edge of bed use of reacher to thread right lower extremity.  Full family teaching completed plan discharge to home       Disposition: Discharged home    Diet: Regular  Special Instructions: No driving smoking or alcohol  Weightbearing as tolerated  Medications at discharge 1.  Tylenol as needed 2.  Vitamin D 1000 units daily 3.  Colace 100 mg twice daily 4.  Hydrocodone 1 to 2 tablets every 6 hours as needed pain 5.  Lidoderm patch change as directed 6.  Robaxin 500 mg 4 times daily as needed 7.  Toprol-XL 25 mg daily 8.   Remeron 15 mg nightly 9.  Multivitamin daily 10.  Zoloft 50 mg daily 11.  Vitamin B12 1000 mcg p.o. daily 12.  Ambien 5 mg nightly as needed sleep  30-35 minutes were spent completing discharge summary and discharge planning Discharge Instructions     Ambulatory referral to Physical Medicine Rehab   Complete by: As directed    Moderate complexity follow-up 1 month left intertrochanteric femur fracture/multiple medical        Follow-up Information     Jamse Arn, MD Follow up.   Specialty: Physical Medicine and Rehabilitation Why: Office to call for appointment Contact information: [address] [place] [address] [phone]         Leandrew Koyanagi, MD Follow up.   Specialty: Orthopedic Surgery Why: Call for appointment Contact information: [place] [address] [phone]                 Signed: Cathlyn Parsons 01/21/2020, 5:20 AM Patient was seen, face-face, and physical exam performed by me on day of discharge, greater than 30 minutes of total time spent.. Please see progress note from day of discharge as well.  Delice Lesch, MD, ABPMR

## 2020-01-19 NOTE — Progress Notes (Signed)
Alison Rivera PHYSICAL MEDICINE & REHABILITATION PROGRESS NOTE  Subjective/Complaints: Patient seen sitting up in her chair this morning.  She states she did not sleep well overnight because she could not be comfortable.  ROS: Denies CP, SOB, N/V/D  Objective: Vital Signs: Blood pressure (!) 151/62, pulse 72, temperature 97.8 F (36.6 C), temperature source Oral, resp. rate 17, height 5\' 4"  (1.626 m), weight 57.4 kg, SpO2 96 %. DG HIP UNILAT WITH PELVIS 2-3 VIEWS RIGHT  Result Date: 01/18/2020 CLINICAL DATA:  ORIF EXAM: DG HIP (WITH OR WITHOUT PELVIS) 2-3V RIGHT COMPARISON:  01/06/2020 FINDINGS: Changes of internal fixation across the proximal right femoral fracture. No change in alignment. No hardware complicating feature. IMPRESSION: No change in the postoperative appearance of the right femur. Electronically Signed   By: Rolm Baptise M.D.   On: 01/18/2020 20:13   Recent Labs    01/19/20 0451  WBC 5.1  HGB 9.6*  HCT 28.8*  PLT 411*   Recent Labs    01/19/20 0451  NA 134*  K 4.1  CL 98  CO2 28  GLUCOSE 97  BUN 22  CREATININE 0.80  CALCIUM 9.4    Intake/Output Summary (Last 24 hours) at 01/19/2020 0923 Last data filed at 01/19/2020 0730 Gross per 24 hour  Intake 560 ml  Output --  Net 560 ml        Physical Exam: BP (!) 151/62 (BP Location: Right Arm)    Pulse 72    Temp 97.8 F (36.6 C) (Oral)    Resp 17    Ht 5\' 4"  (1.626 m)    Wt 57.4 kg    SpO2 96%    BMI 21.72 kg/m  Constitutional: No distress . Vital signs reviewed. HENT: Normocephalic.  Atraumatic. Eyes: EOMI. No discharge. Cardiovascular: No JVD.  RRR. Respiratory: Normal effort.  No stridor.  Bilateral clear to auscultation. GI: Non-distended.  BS +. Skin: Warm and dry.  Right hip with staples CDI Psych: Normal mood.  Normal behavior. Musc: Right hip with improving edema.  Minimal tenderness. Neuro: Alert Motor:  Right lower extremity: Hip flexion 3 -/5 (pain inhibition), knee extension 4-/5 (pain  inhibition), ankle dorsiflexion 4+/5. Left lower extremity: Hip flexion, knee extension 4-4+/5 (pain inhibition), ankle dorsiflexion 4+/5  Assessment/Plan: 1. Functional deficits which require 3+ hours per day of interdisciplinary therapy in a comprehensive inpatient rehab setting.  Physiatrist is providing close team supervision and 24 hour management of active medical problems listed below.  Physiatrist and rehab team continue to assess barriers to discharge/monitor patient progress toward functional and medical goals   Care Tool:  Bathing    Body parts bathed by patient: Right arm,Left arm,Chest,Abdomen,Front perineal area,Right upper leg,Left upper leg,Face,Left lower leg,Buttocks   Body parts bathed by helper: Buttocks     Bathing assist Assist Level: Contact Guard/Touching assist     Upper Body Dressing/Undressing Upper body dressing   What is the patient wearing?: Pull over shirt,Bra    Upper body assist Assist Level: Independent with assistive device    Lower Body Dressing/Undressing Lower body dressing      What is the patient wearing?: Pants,Underwear/pull up     Lower body assist Assist for lower body dressing: Supervision/Verbal cueing     Toileting Toileting    Toileting assist Assist for toileting: Contact Guard/Touching assist     Transfers Chair/bed transfer  Transfers assist     Chair/bed transfer assist level: Supervision/Verbal cueing Chair/bed transfer assistive device: Programmer, multimedia  Ambulation assist      Assist level: Supervision/Verbal cueing Assistive device: Walker-rolling Max distance: 147ft   Walk 10 feet activity   Assist  Walk 10 feet activity did not occur: Safety/medical concerns (pain, generalized weakness, decreased balance)  Assist level: Supervision/Verbal cueing Assistive device: Walker-rolling   Walk 50 feet activity   Assist Walk 50 feet with 2 turns activity did not occur:  Safety/medical concerns  Assist level: Supervision/Verbal cueing Assistive device: Walker-rolling    Walk 150 feet activity   Assist Walk 150 feet activity did not occur: Safety/medical concerns         Walk 10 feet on uneven surface  activity   Assist Walk 10 feet on uneven surfaces activity did not occur: Safety/medical concerns   Assist level: Supervision/Verbal cueing Assistive device: Aeronautical engineer Will patient use wheelchair at discharge?: Yes Type of Wheelchair: Manual    Wheelchair assist level: Supervision/Verbal cueing Max wheelchair distance: 150    Wheelchair 50 feet with 2 turns activity    Assist        Assist Level: Supervision/Verbal cueing   Wheelchair 150 feet activity     Assist      Assist Level: Supervision/Verbal cueing    Medical Problem List and Plan: 1.  Decreased functional ability secondary to right intertrochanteric hip fracture.  Status post open treatment intramedullary implant 01/04/2020.  Weightbearing as tolerated  Continue CIR  Repeat hip x-ray showing postsurgical changes. 2.  Antithrombotics: -DVT/anticoagulation: Lovenox.    Creatinine 0.72 on 12/10              Venous Dopplers- no evidence of clots.              -antiplatelet therapy: N/A 3. Pain Management:   Robaxin as needed, changed to scheduled on 12/9             Scheduled Tylenol, add heating pad for low back pain.   Lidoderm patch ordered.  Continue Norco prn, using sparingly  Controlled with meds on 12/14.  4. Mood: Zoloft 50 mg daily             -antipsychotic agents: N/A 5. Neuropsych: This patient is capable of making decisions on her own behalf. 6. Skin/Wound Care:   DC staples 7. Fluids/Electrolytes/Nutrition: Routine in and outs 8.  Acute blood loss anemia.               Hemoglobin 9.6 on 12/14  Continue to monitor 9.  Hypertension.  Toprolol 25 daily  Controlled on 12/14             Monitor with  increased activity 10.  Drug-induced constipation.  Colace 100 mg twice daily, MiraLAX daily             Increase bowel meds as necessary 11.  Hyperglycemia-resolved  Hemoglobin A1c 5.2 on 12/6 12.  Hypoalbuminemia  Supplement initiated on 12/2 13.  Sleep disturbance  Increased trazodone to 50mg , DC'd  Ambien 5 mg PTA, resumed-educated patient on safety, DC'd per daughter request  Continue Remeron  Improving overall 14.  Hyponatremia  Sodium 134 on 12/14  Continue to monitor  LOS: 13 days A FACE TO FACE EVALUATION WAS PERFORMED  Alison Rivera Lorie Phenix 01/19/2020, 9:23 AM

## 2020-01-19 NOTE — Progress Notes (Signed)
Staples on right hip removed. Pt tolerated well. Noted mild blanchable redness to incision. Scant dry blood noted. Incision OTA. No complications noted.

## 2020-01-20 ENCOUNTER — Inpatient Hospital Stay (HOSPITAL_COMMUNITY): Payer: Medicare Other

## 2020-01-20 ENCOUNTER — Inpatient Hospital Stay (HOSPITAL_COMMUNITY): Payer: Medicare Other | Admitting: Occupational Therapy

## 2020-01-20 ENCOUNTER — Inpatient Hospital Stay (HOSPITAL_COMMUNITY): Payer: Medicare Other | Admitting: Physical Therapy

## 2020-01-20 DIAGNOSIS — Z9889 Other specified postprocedural states: Secondary | ICD-10-CM

## 2020-01-20 LAB — CREATININE, SERUM
Creatinine, Ser: 0.84 mg/dL (ref 0.44–1.00)
GFR, Estimated: >60 mL/min (ref >60)

## 2020-01-20 MED ORDER — CYANOCOBALAMIN 100 MCG PO TABS: 100.0000 ug | ORAL_TABLET | Freq: Every day | ORAL | 0 refills | Status: DC

## 2020-01-20 MED ORDER — SERTRALINE HCL 50 MG PO TABS: 50.0000 mg | ORAL_TABLET | Freq: Every day | ORAL | 0 refills | Status: DC

## 2020-01-20 MED ORDER — HYDROCODONE-ACETAMINOPHEN 5-325 MG PO TABS
1.0000 | ORAL_TABLET | Freq: Four times a day (QID) | ORAL | 0 refills | Status: DC | PRN
Start: 2020-01-20 — End: 2021-01-03

## 2020-01-20 MED ORDER — ZOLPIDEM TARTRATE 5 MG PO TABS: 5.0000 mg | ORAL_TABLET | Freq: Every evening | ORAL | 0 refills | Status: DC | PRN

## 2020-01-20 MED ORDER — METOPROLOL SUCCINATE ER 25 MG PO TB24: 25.0000 mg | ORAL_TABLET | Freq: Every day | ORAL | 0 refills | Status: DC

## 2020-01-20 MED ORDER — METHOCARBAMOL 500 MG PO TABS: 500.0000 mg | ORAL_TABLET | Freq: Four times a day (QID) | ORAL | 0 refills | Status: DC | PRN

## 2020-01-20 MED ORDER — VITAMIN D3 25 MCG PO TABS: 1000.0000 [IU] | ORAL_TABLET | Freq: Every day | ORAL | 0 refills | Status: AC

## 2020-01-20 MED ORDER — MIRTAZAPINE 15 MG PO TABS: 15.0000 mg | ORAL_TABLET | Freq: Every day | ORAL | 0 refills | Status: DC

## 2020-01-20 MED ORDER — LIDOCAINE 5 % EX PTCH: 1.0000 | MEDICATED_PATCH | CUTANEOUS | 0 refills | Status: DC

## 2020-01-20 NOTE — Progress Notes (Signed)
Physical Therapy Session Note  Patient Details  Name: Alison Rivera MRN: [id number] Date of Birth: December 14, 1931  Today's Date: 01/20/2020 PT Individual Time: 1100-1125 and [id number] PT Individual Time Calculation (min): 25 min and 54 min  Short Term Goals: Week 1:  PT Short Term Goal 1 (Week 1): pt will perform bed mobility with min A overall PT Short Term Goal 1 - Progress (Week 1): Met PT Short Term Goal 2 (Week 1): Pt will transfer bed<>WC with LRAD and CGA PT Short Term Goal 2 - Progress (Week 1): Met PT Short Term Goal 3 (Week 1): Pt will ambulate 69ft with LRAD and CGA PT Short Term Goal 3 - Progress (Week 1): Met Week 2:  PT Short Term Goal 1 (Week 2): STG=LTG due to LOS  Skilled Therapeutic Interventions/Progress Updates:   Treatment Session 1: 1100-1125 25 min Received pt sitting in recliner, pt agreeable to therapy, and denied any pain during session. Session with emphasis on discharge planning, functional mobility/transfers, generalized strengthening, dynamic standing balance/coordination, ambulation, simulated car transfers, and improved activity tolerance. Pt transferred recliner<>WC without AD and supervision and transported to ortho gym in [facility] total A for time management purposes. Pt ambulated 57ft on uneven surfaces (ramp) with RW and supervision and performed ambulatory simulated car transfer with RW and supervision. Pt ambulated 117ft with RW and supervision back to room. Concluded session with pt sitting in WC, needs within reach, and seatbelt alarm on.   Treatment Session 2: 1430-1524 54 min Received pt sitting in [facility] with husband present at bedside, pt agreeable to therapy, and denied any pain during session. Session with emphasis on discharge planning, functional mobility/transfers, generalized strengthening, dynamic standing balance/coordination, ambulation, stair navigation, and improved activity tolerance. Pt transported to therapy gym in [facility] total A for time management  purposes and navigated 6 steps with 2 rails CGA/min A ascending and descending with a step to pattern. Pt required min cues for "up with the good, down with the bad" technique. Pt ambulated 113ft with RW and supervision to dayroom and performed standing BLE strengthening on Kinetron at 20cm/sec for 30 seconds x 4 trials with BUE support and CGA for balance. Pt transferred sit<>stand without UE support 2x8 reps with CGA for balance with cues for scooting to edge of chair and for anterior weight shifting. Pt transferred sit<>stand on Airex and worked on L SLS for 15 seconds x 2 trials with 1UE support fading to no UE support and R SLS for 15 seconds x 2 trials with BUE support fading to 1 UE support and min A for balance. Worked on dynamic standing balance on Airex without UE support with therapist applying perterbations; pt with no LOB x 2 trials. Educated pt on [place] parts management and pt able to don/doff legrests with supervision and increased time. Pt performed WC mobility 371ft using BUE mod I back to room. Concluded session with pt sitting in recliner, needs within reach, and seatbelt alarm on. Husband present at bedside.   Therapy Documentation Precautions:  Precautions Precautions: Fall Restrictions Weight Bearing Restrictions: No RLE Weight Bearing: Weight bearing as tolerated  Therapy/Group: Individual Therapy [name] PT, DPT   01/20/2020, 7:34 AM

## 2020-01-20 NOTE — Progress Notes (Addendum)
[place] PHYSICAL MEDICINE & REHABILITATION PROGRESS NOTE  Subjective/Complaints: Patient seen sitting up in her chair this morning working with therapies.  She states she slept well overnight.  She is excited for discharge tomorrow.  ROS: Denies CP, SOB, N/V/D  Objective: Vital Signs: Blood pressure (!) 148/63, pulse 67, temperature 97.9 F (36.6 C), resp. rate 17, height 5\' 4"  (1.626 m), weight 55.7 kg, SpO2 96 %. DG HIP UNILAT WITH PELVIS 2-3 VIEWS RIGHT  Result Date: 01/18/2020 CLINICAL DATA:  ORIF EXAM: DG HIP (WITH OR WITHOUT PELVIS) 2-3V RIGHT COMPARISON:  01/06/2020 FINDINGS: Changes of internal fixation across the proximal right femoral fracture. No change in alignment. No hardware complicating feature. IMPRESSION: No change in the postoperative appearance of the right femur. Electronically Signed   By: Rolm Baptise M.D.   On: 01/18/2020 20:13   Recent Labs    01/19/20 0451  WBC 5.1  HGB 9.6*  HCT 28.8*  PLT 411*   Recent Labs    01/19/20 0451 01/20/20 0416  NA 134*  --   K 4.1  --   CL 98  --   CO2 28  --   GLUCOSE 97  --   BUN 22  --   CREATININE 0.80 0.84  CALCIUM 9.4  --     Intake/Output Summary (Last 24 hours) at 01/20/2020 3154 Last data filed at 01/20/2020 0744 Gross per 24 hour  Intake 1022 ml  Output --  Net 1022 ml        Physical Exam: BP (!) 148/63   Pulse 67   Temp 97.9 F (36.6 C)   Resp 17   Ht 5\' 4"  (1.626 m)   Wt 55.7 kg   SpO2 96%   BMI 21.08 kg/m  Constitutional: No distress . Vital signs reviewed. HENT: Normocephalic.  Atraumatic. Eyes: EOMI. No discharge. Cardiovascular: No JVD.  RRR. Respiratory: Normal effort.  No stridor.  Bilateral clear to auscultation. GI: Non-distended.  BS +. Skin: Warm and dry.  Right hip incision CDI. Right hip hematoma. Psych: Normal mood.  Normal behavior. Musc: Right hip with improving edema.  Minimal tenderness. Neuro: Alert Motor:  Right lower extremity: Hip flexion 3-3+/5 (pain  inhibition), knee extension 4-/5 (pain inhibition), ankle dorsiflexion 4+/5. Left lower extremity: Hip flexion, knee extension 4-4+/5 (pain inhibition), ankle dorsiflexion 4+/5  Assessment/Plan: 1. Functional deficits which require 3+ hours per day of interdisciplinary therapy in a comprehensive inpatient rehab setting.  Physiatrist is providing close team supervision and 24 hour management of active medical problems listed below.  Physiatrist and rehab team continue to assess barriers to discharge/monitor patient progress toward functional and medical goals   Care Tool:  Bathing    Body parts bathed by patient: Right arm,Left arm,Chest,Abdomen,Front perineal area,Right upper leg,Left upper leg,Face,Left lower leg,Buttocks   Body parts bathed by helper: Buttocks     Bathing assist Assist Level: Contact Guard/Touching assist     Upper Body Dressing/Undressing Upper body dressing   What is the patient wearing?: Pull over shirt,Bra    Upper body assist Assist Level: Independent with assistive device    Lower Body Dressing/Undressing Lower body dressing      What is the patient wearing?: Pants,Underwear/pull up     Lower body assist Assist for lower body dressing: Supervision/Verbal cueing     Toileting Toileting    Toileting assist Assist for toileting: Contact Guard/Touching assist     Transfers Chair/bed transfer  Transfers assist     Chair/bed transfer assist level:  Supervision/Verbal cueing Chair/bed transfer assistive device: Museum/gallery exhibitions officer assist      Assist level: Supervision/Verbal cueing Assistive device: Walker-rolling Max distance: 143ft   Walk 10 feet activity   Assist  Walk 10 feet activity did not occur: Safety/medical concerns (pain, generalized weakness, decreased balance)  Assist level: Supervision/Verbal cueing Assistive device: Walker-rolling   Walk 50 feet activity   Assist Walk 50 feet with 2  turns activity did not occur: Safety/medical concerns  Assist level: Supervision/Verbal cueing Assistive device: Walker-rolling    Walk 150 feet activity   Assist Walk 150 feet activity did not occur: Safety/medical concerns         Walk 10 feet on uneven surface  activity   Assist Walk 10 feet on uneven surfaces activity did not occur: Safety/medical concerns   Assist level: Supervision/Verbal cueing Assistive device: Aeronautical engineer Will patient use wheelchair at discharge?: Yes Type of Wheelchair: Manual    Wheelchair assist level: Supervision/Verbal cueing Max wheelchair distance: 150    Wheelchair 50 feet with 2 turns activity    Assist        Assist Level: Supervision/Verbal cueing   Wheelchair 150 feet activity     Assist      Assist Level: Supervision/Verbal cueing    Medical Problem List and Plan: 1.  Decreased functional ability secondary to right intertrochanteric hip fracture.  Status post open treatment intramedullary implant 01/04/2020.  Weightbearing as tolerated  Continue CIR, patient and family education  Repeat hip x-ray showing postsurgical changes.  Team conference today to discuss current and goals and coordination of care, home and environmental barriers, and discharge planning with nursing, case manager, and therapies. Please see conference note from today as well.  2.  Antithrombotics: -DVT/anticoagulation: Lovenox.    Creatinine within normal limits on 12/15             Venous Dopplers- no evidence of clots.              -antiplatelet therapy: N/A 3. Pain Management:   Robaxin as needed, changed to scheduled on 12/9             Scheduled Tylenol, add heating pad for low back pain.   Lidoderm patch ordered.  Continue Norco prn, using sparingly  Controlled with meds on 12/15 4. Mood: Zoloft 50 mg daily             -antipsychotic agents: N/A 5. Neuropsych: This patient is capable of making  decisions on her own behalf. 6. Skin/Wound Care:   DCed staples 7. Fluids/Electrolytes/Nutrition: Routine in and outs 8.  Acute blood loss anemia.               Hemoglobin 9.6 on 12/14  Continue to monitor 9.  Hypertension.  Toprolol 25 daily  Relatively controlled on 12/15             Monitor with increased activity 10.  Drug-induced constipation.  Colace 100 mg twice daily, MiraLAX daily             Increase bowel meds as necessary 11.  Hyperglycemia-resolved  Hemoglobin A1c 5.2 on 12/6 12.  Hypoalbuminemia  Supplement initiated on 12/2 13.  Sleep disturbance  Increased trazodone to 50mg , DC'd  Ambien 5 mg PTA, resumed-educated patient on safety, DC'd per daughter request  Continue Remeron  Improved 14.  Hyponatremia  Sodium 134 on 12/14  Continue to monitor  LOS: 14 days  A FACE TO FACE EVALUATION WAS PERFORMED  Makahla Kiser Lorie Phenix 01/20/2020, 9:22 AM

## 2020-01-20 NOTE — Progress Notes (Signed)
Physical Therapy Discharge Summary  Patient Details  Name: Alison Rivera MRN: [id number] Date of Birth: Feb 25, 1931  Patient has met 9 of 9 long term goals due to improved activity tolerance, improved balance, improved postural control, increased strength, increased range of motion, decreased pain, improved awareness and improved coordination. Patient to discharge at an ambulatory level Supervision. Pt's husband and daughter will be primary caregivers upon D/C. Pt's husband attended family education training on 12/13 and verbalized confidence with all tasks to ensure safe discharge home. Pt's husband participated in hands on training with stair navigation to allow him to see how difficult stair navigation is for pt due to pain and weakness and reasoning behind therapist's recommendation for ramp.   All goals met   Recommendation:  Patient will benefit from ongoing skilled PT services in home health setting to continue to advance safe functional mobility, address ongoing impairments in functional mobility, generalized strengthening, dynamic standing balance/coordination, ambulation, stair navigation, endurance, and to minimize fall risk.  Equipment: RW  Reasons for discharge: treatment goals met  Patient/family agrees with progress made and goals achieved: Yes  PT Discharge Precautions/Restrictions Precautions Precautions: Fall Restrictions Weight Bearing Restrictions: Yes RLE Weight Bearing: Weight bearing as tolerated Cognition Overall Cognitive Status: Within Functional Limits for tasks assessed Arousal/Alertness: Awake/alert Orientation Level: Oriented X4 Memory: Appears intact Awareness: Appears intact Problem Solving: Appears intact Safety/Judgment: Appears intact Sensation Sensation Light Touch: Appears Intact Hot/Cold: Appears Intact Proprioception: Appears Intact Coordination Gross Motor Movements are Fluid and Coordinated: No Fine Motor Movements are Fluid and  Coordinated: Yes Coordination and Movement Description: mild uncoordination due to pain and generalized weakness Finger Nose Finger Test: [facility] bilaterally Heel Shin Test: WFL on LLE, decreased ROM on RLE Motor  Motor Motor: Within Functional Limits Motor - Skilled Clinical Observations: generalized weakness  Mobility Bed Mobility Bed Mobility: Rolling Right;Rolling Left;Sit to Supine;Supine to Sit Rolling Right: Supervision/verbal cueing Rolling Left: Supervision/Verbal cueing Supine to Sit: Supervision/Verbal cueing Sit to Supine: Supervision/Verbal cueing Transfers Transfers: Sit to Stand;Stand to Sit;Stand Pivot Transfers Sit to Stand: Independent with assistive device Stand to Sit: Independent with assistive device Stand Pivot Transfers: Independent with assistive device Stand Pivot Transfer Details (indicate cue type and reason): none Transfer (Assistive device): Rolling walker Locomotion  Gait Ambulation: Yes Gait Assistance: Supervision/Verbal cueing Gait Distance (Feet): 150 Feet Assistive device: Rolling walker Gait Assistance Details: Verbal cues for technique;Verbal cues for precautions/safety Gait Assistance Details: verbal cues for energy conservation and weight bearing on RLE Gait Gait: Yes Gait Pattern: Impaired Gait Pattern: Step-to pattern;Decreased step length - left;Decreased step length - right;Decreased stance time - right;Decreased stride length;Decreased hip/knee flexion - right;Decreased weight shift to right;Trunk flexed;Poor foot clearance - right;Poor foot clearance - left;Narrow base of support;Decreased trunk rotation;Antalgic Gait velocity: Decreased Stairs / Additional Locomotion Stairs: Yes Stairs Assistance: Minimal Assistance - Patient > 75% Stair Management Technique: Two rails Number of Stairs: 6 Height of Stairs: 6 Ramp: Supervision/Verbal cueing (RW) Product manager Mobility: Yes Wheelchair Assistance: Independent with  Camera operator: Both upper extremities Wheelchair Parts Management: Needs assistance Distance: 173ft  Trunk/Postural Assessment  Cervical Assessment Cervical Assessment: Exceptions to [facility] (forward head) Thoracic Assessment Thoracic Assessment: Exceptions to [facility] (rounded shoulders) Lumbar Assessment Lumbar Assessment: Exceptions to [facility] (posterior pelvic tilt) Postural Control Postural Control: Within Functional Limits  Balance Balance Balance Assessed: Yes Static Sitting Balance Static Sitting - Balance Support: Feet supported;No upper extremity supported Static Sitting - Level of Assistance: 7: Independent Dynamic Sitting Balance  Dynamic Sitting - Balance Support: Feet supported;No upper extremity supported Dynamic Sitting - Level of Assistance: 7: Independent Static Standing Balance Static Standing - Balance Support: Bilateral upper extremity supported (RW) Static Standing - Level of Assistance: 6: Modified independent (Device/Increase time) Dynamic Standing Balance Dynamic Standing - Balance Support: Bilateral upper extremity supported (RW) Dynamic Standing - Level of Assistance: 6: Modified independent (Device/Increase time) Dynamic Standing - Comments: with transfers Extremity Assessment  RLE Assessment RLE Assessment: Exceptions to [facility] Strength Comments: grossly generalized to 4/5 (except hip flexion 3-/5) LLE Assessment LLE Assessment: Exceptions to [facility] Strength Comments: grossly generalized to 4+/5  [name] PT, DPT  01/20/2020, 7:41 AM

## 2020-01-20 NOTE — Progress Notes (Addendum)
Inpatient Rehabilitation Care Coordinator  Discharge Note  The overall goal for the admission was met for:   Discharge location: Yes-HOME WITH HUSBAND AND DAUGHTER TO ASSIST  Length of Stay: Yes-15 DAYS  Discharge activity level: Yes-MOD/I-SUPERVISION LEVEL  Home/community participation: Yes  Services provided included: MD, RD, PT, OT, RN, CM, Pharmacy, Neuropsych and SW  Financial Services: Medicare and Private Insurance: [facility]  Follow-up services arranged: Home Health: [place], DME: [facility] AND 3 IN 1 and Patient/Family has no preference for HH/DME agencies  Comments (or additional information):HUSBAND WAS HERE DAILY STAYING [place]. DECLINED RAMP WHICH WAS RECOMMENDED. OTH DAUGHTER'S HAVE CONCERNS REGARDING HUSBAND'S ABILITY TO PROVIDE CARE TO WIFE AND HIS MENTAL CAPACITY. GAVE PRIVATE DUTY LIST AND ENCOURAGED DAUGHTER TO ASSIST AND BE IN THE HOME ALSO OR PURSUE PRIVATE DUTY ASSISTANCE  Patient/Family verbalized understanding of follow-up arrangements: Yes  Individual responsible for coordination of the follow-up plan: Christinia Gully [phone] OR KATHERINE-DAUGHTER [phone]  Confirmed correct DME delivered: Elease Hashimoto 01/20/2020    Elease Hashimoto

## 2020-01-20 NOTE — Progress Notes (Signed)
Physical Therapy Session Note  Patient Details  Name: Alison Rivera MRN: [id number] Date of Birth: [birth year]  Today's Date: 01/20/2020 PT Individual Time: 1630-1700 PT Individual Time Calculation (min): 30 min   Short Term Goals: Week 2:  PT Short Term Goal 1 (Week 2): STG=LTG due to LOS  Skilled Therapeutic Interventions/Progress Updates:    Pt received seated in w/c in room, agreeable to PT session. No complaints of pain. Patient independent with w/c mobility to/from therapy gym. Sit to stand and stand pivot transfer with RW at Supervision level throughout session. Standing balance with no UE support and CGA performing ball toss against rebounder, 2 x 25 reps. Sidesteps L/R 2 x 10 ft each direction with RW and CGA for balance. Pt returned to room and remained seated in w/c at end of session, needs in reach, quick release belt and chair alarm in place.  Therapy Documentation Precautions:  Precautions Precautions: Fall Restrictions Weight Bearing Restrictions: Yes RLE Weight Bearing: Weight bearing as tolerated    Therapy/Group: Individual Therapy   Excell Seltzer, PT, DPT  01/20/2020, 5:19 PM

## 2020-01-20 NOTE — Progress Notes (Signed)
Occupational Therapy Discharge Summary  Patient Details  Name: Alison Rivera MRN: [id number] Date of Birth: [birth year]   Patient has met 10 of 10 long term goals due to improved activity tolerance, improved balance, postural control, ability to compensate for deficits, functional use of  RIGHT lower extremity and improved awareness.  Patient to discharge at overall Modified Independent level overall, Supervision for bathing at shower level, shower transfer and LB dressing.  Patient's care partner is independent to provide the necessary intermittent assistance at discharge.  Patient's husband and adult daughters have been present intermittently during stay with husband completing family education with PT with functional mobility and transfers.    Reasons goals not met: N/A  Recommendation:  Patient will benefit from ongoing skilled OT services in home health setting to continue to advance functional skills in the area of BADL and Reduce care partner burden.  Equipment: 3 in 1  Reasons for discharge: treatment goals met and discharge from hospital  Patient/family agrees with progress made and goals achieved: Yes  OT Discharge Precautions/Restrictions  Precautions Precautions: Fall Restrictions Weight Bearing Restrictions: Yes RLE Weight Bearing: Weight bearing as tolerated Pain Pain Assessment Pain Scale: 0-10 Pain Score: 0-No pain ADL ADL Equipment Provided: Reacher,Sock aid (shoe funnel) Eating: Independent Grooming: Modified independent Where Assessed-Grooming: Sitting at sink,Standing at sink Upper Body Bathing: Setup Where Assessed-Upper Body Bathing: Shower Lower Body Bathing: Supervision/safety Where Assessed-Lower Body Bathing: Shower Upper Body Dressing: Independent Lower Body Dressing: Supervision/safety Where Assessed-Lower Body Dressing: Edge of bed Toileting: Modified independent Where Assessed-Toileting: Toilet (BSC over toilet) Toilet Transfer: Distant  supervision Toilet Transfer Method: Counselling psychologist: Raised toilet seat Social research officer, government: Close supervision Social research officer, government Method: Heritage manager: Civil engineer, contracting with back,Grab bars Vision Baseline Vision/History: Wears glasses Wears Glasses: Reading only Patient Visual Report: No change from baseline Vision Assessment?: No apparent visual deficits Perception  Perception: Within Functional Limits Praxis Praxis: Intact Cognition Overall Cognitive Status: Within Functional Limits for tasks assessed Arousal/Alertness: Awake/alert Orientation Level: Oriented X4 Attention: Selective Selective Attention: Appears intact Memory: Appears intact Awareness: Appears intact Problem Solving: Appears intact Safety/Judgment: Appears intact Sensation Sensation Light Touch: Appears Intact Hot/Cold: Appears Intact Proprioception: Appears Intact Coordination Gross Motor Movements are Fluid and Coordinated: No Fine Motor Movements are Fluid and Coordinated: Yes Finger Nose Finger Test: [facility] bilaterally Motor  Motor Motor: Within Functional Limits Motor - Skilled Clinical Observations: generalized weakness Mobility  Bed Mobility Bed Mobility: Rolling Right;Rolling Left;Sit to Supine;Supine to Sit Rolling Right: Supervision/verbal cueing Rolling Left: Supervision/Verbal cueing Supine to Sit: Supervision/Verbal cueing Sit to Supine: Supervision/Verbal cueing Transfers Sit to Stand: Independent with assistive device Stand to Sit: Independent with assistive device  Trunk/Postural Assessment  Cervical Assessment Cervical Assessment: Exceptions to [facility] (forward head) Thoracic Assessment Thoracic Assessment: Exceptions to [facility] (rounded shoulders) Lumbar Assessment Lumbar Assessment: Exceptions to [facility] (posterior pelvic tilt) Postural Control Postural Control: Within Functional Limits  Balance Balance Balance Assessed: Yes Static  Sitting Balance Static Sitting - Balance Support: Feet supported;No upper extremity supported Static Sitting - Level of Assistance: 7: Independent Dynamic Sitting Balance Dynamic Sitting - Balance Support: Feet supported;No upper extremity supported Dynamic Sitting - Level of Assistance: 7: Independent Static Standing Balance Static Standing - Balance Support: Bilateral upper extremity supported (RW) Static Standing - Level of Assistance: 6: Modified independent (Device/Increase time) Dynamic Standing Balance Dynamic Standing - Balance Support: Bilateral upper extremity supported (RW) Dynamic Standing - Level of Assistance: 6: Modified independent (Device/Increase time) Dynamic  Standing - Comments: with transfers Extremity/Trunk Assessment RUE Assessment RUE Assessment: Within Functional Limits LUE Assessment LUE Assessment: Within Functional Limits   Boomer Winders, [facility] 01/20/2020, 9:26 AM

## 2020-01-20 NOTE — Progress Notes (Signed)
Patient educated on Lovenox, home kit in room. No other concerns to report.

## 2020-01-20 NOTE — Progress Notes (Signed)
Occupational Therapy Session Note  Patient Details  Name: Alison Rivera MRN: [id number] Date of Birth: April 09, 1931  Today's Date: 01/20/2020 OT Individual Time: 5027-7412 OT Individual Time Calculation (min): 60 min    Short Term Goals: Week 2:  OT Short Term Goal 1 (Week 2): STG = LTGs due to remaining LOS  Skilled Therapeutic Interventions/Progress Updates:    Treatment session with focus on self-care retraining and d/c planning.  Pt received upright in recliner agreeable to shower this session.  Pt completed ambulatory transfer to room shower with RW with supervision.  Pt able to doff clothing prior to shower with supervision and completed bathing at sit > stand level with supervision.  Pt ambulated to EOB to engage in dressing, per home routine.  Pt able to complete UB dressing Mod I and LB dressing with supervision with use of AE for LB dressing. Pt improving with mobility and self-care with decreased pain and improved mobility.  Pt completed oral care in standing at sink this session with good weight shifting and standing tolerance.  Pt returned to sitting upright in recliner.  Pt reports no further questions and is ready for d/c.  Pt remained upright in recliner with seat belt alarm on and all needs in reach.  Therapy Documentation Precautions:  Precautions Precautions: Fall Restrictions Weight Bearing Restrictions: Yes RLE Weight Bearing: Weight bearing as tolerated Pain: Pain Assessment Pain Scale: 0-10 Pain Score: 0-No pain   Therapy/Group: Individual Therapy  Simonne Come 01/20/2020, 9:15 AM

## 2020-01-20 NOTE — Patient Care Conference (Signed)
Inpatient RehabilitationTeam Conference and Plan of Care Update Date: 01/20/2020   Time: 11:47 AM    Patient Name: Alison Rivera      Medical Record Number: [id number]  Date of Birth: [birth year] Sex: Female         Room/Bed: 4M08C/4M08C-01 Payor Info: Payor: MEDICARE / Plan: MEDICARE PART A AND B / Product Type: *No Product type* /    Admit Date/Time:  01/06/2020  4:34 PM  Primary Diagnosis:  Intertrochanteric fracture of right hip [facility])  Hospital Problems: Principal Problem:   Intertrochanteric fracture of right hip ([place]) Active Problems:   Hypoalbuminemia due to protein-calorie malnutrition ([place])   Hyperglycemia   Sleep disturbance   Labile blood pressure   Hyponatremia   History of open reduction and internal fixation (ORIF) procedure    Expected Discharge Date: Expected Discharge Date: 01/21/20  Team Members Present: Physician leading conference: Dr. Delice Lesch Care Coodinator Present: Dorien Chihuahua, RN, BSN, CRRN;Becky Dupree, LCSW Nurse Present: Annita Brod, LPN PT Present: Becky Sax, PT OT Present: Simonne Come, OT PPS Coordinator present : Gunnar Fusi, Novella Olive, PT     Current Status/Progress Goal Weekly Team Focus  Bowel/Bladder   cont of b and b lbm 12/14  remain cont.  ass q shift and prn   Swallow/Nutrition/ Hydration             ADL's   CGA to close supervision ambulatory transfer with RW, Supervision LB dressing with use of AE except Rt shoe and TEDS, Supervision bathing and UB dressing  Mod I dynamic standing, sit > stand, and toileting; Supervision bathing and dressing, bathroom transfers, and simple meal prep  ADL retraining, AE for LB dressing, dynamic standing balance, WB tolerance in standing, functional transfers   Mobility   bed mobility supervision with leg lifter, transfers supervision with RW, gait 167ft with RW supervision, 6 steps with 2 rails min A  Mod I transfers, supervision gait  functional mobility/transfers, generalized  strengthening, weight bearing on RLE, standing balance/coordination, gait, family education, stairs, and endurance   Communication             Safety/Cognition/ Behavioral Observations            Pain   pt has no current c/o pain  remain free of pain  Assess q shify and medicate prn   Skin   pt has surgical i  ncisio on rt hip staples removed, covered with foam  healthy healing with no infection  assess q shift and prn     Discharge Planning:  Husband here daily and has gone through education in prepartion for discharge home. Aware will need 24 hr supervision and recomendation which husband has declined and feels he is an Chief Financial Officer and can get her up their stairs   Team Discussion: Discussion of home entry revision recommendations, DME ordered, family education completed. Patient on target to meet rehab goals: yes, currently at goal level for supervision to mod I overall  *See Care Plan and progress notes for long and short-term goals.   Revisions to Treatment Plan:   Teaching Needs: Family education completed   Current Barriers to Discharge: Decreased caregiver support and Home enviroment access/layout  Possible Resolutions to Barriers: Recommendation for hired help to assist family with care Ramp for entry to home (2 step entry)    Medical Summary Current Status: Decreased functional ability secondary to right intertrochanteric hip fracture.  Status post open treatment intramedullary implant 01/04/2020  Barriers to Discharge: Medical stability  Possible Resolutions to [employer] Focus: Therapies, patient and family edu, sleep improved, optimize pain meds, patient and family edu   Continued Need for Acute Rehabilitation Level of Care: The patient requires daily medical management by a physician with specialized training in physical medicine and rehabilitation for the following reasons: Direction of a multidisciplinary physical rehabilitation program to maximize  functional independence : Yes Medical management of patient stability for increased activity during participation in an intensive rehabilitation regime.: Yes Analysis of laboratory values and/or radiology reports with any subsequent need for medication adjustment and/or medical intervention. : Yes   I attest that I was present, lead the team conference, and concur with the assessment and plan of the team.   Dorien Chihuahua B 01/20/2020, 2:29 PM

## 2020-01-20 NOTE — Progress Notes (Signed)
Patient ID: Alison Rivera, female   DOB: 04-Dec-1931, 86 y.o.   MRN: [id number]  Met with pt and husband to discuss team conference progress toward her goals and discharge tomorrow. Both feel prepared and ready and pt finally slept last night. Husband hopes he sleeps better at home once pt is home with him. See in am for last minute questions

## 2020-01-21 NOTE — Progress Notes (Signed)
Discharge instructions discussed per PA. Belongings gathered, questions answered. No other concerns. Pt left per wheelchair to private vehicle. No complications noted.  Sheela Stack, LPN

## 2020-01-21 NOTE — Progress Notes (Signed)
[facility] PHYSICAL MEDICINE & REHABILITATION PROGRESS NOTE  Subjective/Complaints: Patient seen sitting up in her chair this morning.  She states she slept well overnight.  She states she is ready for discharge.  ROS: Denies CP, SOB, N/V/D  Objective: Vital Signs: Blood pressure (!) 160/72, pulse 76, temperature 98.5 F (36.9 C), resp. rate 16, height 5\' 4"  (1.626 m), weight 55.7 kg, SpO2 96 %. No results found. Recent Labs    01/19/20 0451  WBC 5.1  HGB 9.6*  HCT 28.8*  PLT 411*   Recent Labs    01/19/20 0451 01/20/20 0416  NA 134*  --   K 4.1  --   CL 98  --   CO2 28  --   GLUCOSE 97  --   BUN 22  --   CREATININE 0.80 0.84  CALCIUM 9.4  --     Intake/Output Summary (Last 24 hours) at 01/21/2020 1222 Last data filed at 01/21/2020 0811 Gross per 24 hour  Intake 840 ml  Output --  Net 840 ml        Physical Exam: BP (!) 160/72   Pulse 76   Temp 98.5 F (36.9 C)   Resp 16   Ht 5\' 4"  (1.626 m)   Wt 55.7 kg   SpO2 96%   BMI 21.08 kg/m  Constitutional: No distress . Vital signs reviewed. HENT: Normocephalic.  Atraumatic. Eyes: EOMI. No discharge. Cardiovascular: No JVD.  RRR. Respiratory: Normal effort.  No stridor.  Bilateral clear to auscultation. GI: Non-distended.  BS +. Skin: Warm and dry.  Right hip healing. Right hip hematoma, stable Psych: Normal mood.  Normal behavior. Musc: Right hip with edema, no tenderness. Neuro: Alert Motor:  Right lower extremity: Hip flexion 3-3+/5 (pain inhibition), knee extension 4-/5 (pain inhibition), ankle dorsiflexion 4+/5., improving Left lower extremity: Hip flexion, knee extension 4-4+/5 (pain inhibition), ankle dorsiflexion 4+/5  Assessment/Plan: 1. Functional deficits which require 3+ hours per day of interdisciplinary therapy in a comprehensive inpatient rehab setting.  Physiatrist is providing close team supervision and 24 hour management of active medical problems listed below.  Physiatrist and  rehab team continue to assess barriers to discharge/monitor patient progress toward functional and medical goals   Care Tool:  Bathing    Body parts bathed by patient: Right arm,Left arm,Chest,Abdomen,Front perineal area,Right upper leg,Left upper leg,Face,Left lower leg,Buttocks,Right lower leg   Body parts bathed by helper: Buttocks     Bathing assist Assist Level: Supervision/Verbal cueing     Upper Body Dressing/Undressing Upper body dressing   What is the patient wearing?: Pull over shirt,Bra    Upper body assist Assist Level: Independent    Lower Body Dressing/Undressing Lower body dressing      What is the patient wearing?: Pants,Underwear/pull up     Lower body assist Assist for lower body dressing: Supervision/Verbal cueing     Toileting Toileting    Toileting assist Assist for toileting: Independent with assistive device     Transfers Chair/bed transfer  Transfers assist     Chair/bed transfer assist level: Independent with assistive device Chair/bed transfer assistive device: Museum/gallery exhibitions officer assist      Assist level: Supervision/Verbal cueing Assistive device: Walker-rolling Max distance: 169ft   Walk 10 feet activity   Assist  Walk 10 feet activity did not occur: Safety/medical concerns (pain, generalized weakness, decreased balance)  Assist level: Supervision/Verbal cueing Assistive device: Walker-rolling   Walk 50 feet activity   Assist Walk 50  feet with 2 turns activity did not occur: Safety/medical concerns  Assist level: Supervision/Verbal cueing Assistive device: Walker-rolling    Walk 150 feet activity   Assist Walk 150 feet activity did not occur: Safety/medical concerns  Assist level: Supervision/Verbal cueing Assistive device: Walker-rolling    Walk 10 feet on uneven surface  activity   Assist Walk 10 feet on uneven surfaces activity did not occur: Safety/medical  concerns   Assist level: Supervision/Verbal cueing Assistive device: Aeronautical engineer Will patient use wheelchair at discharge?: Yes Type of Wheelchair: Manual    Wheelchair assist level: Independent Max wheelchair distance: 150    Wheelchair 50 feet with 2 turns activity    Assist        Assist Level: Independent   Wheelchair 150 feet activity     Assist      Assist Level: Independent    Medical Problem List and Plan: 1.  Decreased functional ability secondary to right intertrochanteric hip fracture.  Status post open treatment intramedullary implant 01/04/2020.  Weightbearing as tolerated  DC today  Will see patient for hospital follow-up in 1 month post-discharge  Repeat hip x-ray showing postsurgical changes. 2.  Antithrombotics: -DVT/anticoagulation: Lovenox.    Creatinine within normal limits on 12/15             Venous Dopplers- no evidence of clots.              -antiplatelet therapy: N/A 3. Pain Management:   Robaxin as needed, changed to scheduled on 12/9             Scheduled Tylenol, add heating pad for low back pain.   Lidoderm patch ordered.  Continue Norco prn, using sparingly  Controlled with meds on 12/16 4. Mood: Zoloft 50 mg daily             -antipsychotic agents: N/A 5. Neuropsych: This patient is capable of making decisions on her own behalf. 6. Skin/Wound Care:   DCed staples 7. Fluids/Electrolytes/Nutrition: Routine in and outs 8.  Acute blood loss anemia.               Hemoglobin 9.6 on 12/14  Continue to monitor 9.  Hypertension.  Toprolol 25 daily  Controlled for the most part on 12/16             Monitor with increased activity 10.  Drug-induced constipation.  Colace 100 mg twice daily, MiraLAX daily             Increase bowel meds as necessary 11.  Hyperglycemia-resolved  Hemoglobin A1c 5.2 on 12/6 12.  Hypoalbuminemia  Supplement initiated on 12/2 13.  Sleep disturbance  Increased  trazodone to 50mg , DC'd  Ambien 5 mg PTA, resumed-educated patient on safety, DC'd per daughter request  Continue Remeron  Improved 14.  Hyponatremia  Sodium 134 on 12/14  Continue to monitor  > 30 minutes spent in total in discharge planning between myself and PA regarding aforementioned, as well discussion regarding DME equipment, follow-up appointments, follow-up therapies, discharge medications, discharge recommendations  LOS: 15 days A FACE TO FACE EVALUATION WAS PERFORMED  Aneya Daddona Lorie Phenix 01/21/2020, 12:22 PM

## 2020-01-23 DIAGNOSIS — I10 Essential (primary) hypertension: Secondary | ICD-10-CM | POA: Diagnosis not present

## 2020-01-23 DIAGNOSIS — Z85828 Personal history of other malignant neoplasm of skin: Secondary | ICD-10-CM | POA: Diagnosis not present

## 2020-01-23 DIAGNOSIS — F32A Depression, unspecified: Secondary | ICD-10-CM | POA: Diagnosis not present

## 2020-01-23 DIAGNOSIS — Z9181 History of falling: Secondary | ICD-10-CM | POA: Diagnosis not present

## 2020-01-23 DIAGNOSIS — D62 Acute posthemorrhagic anemia: Secondary | ICD-10-CM | POA: Diagnosis not present

## 2020-01-23 DIAGNOSIS — S72141D Displaced intertrochanteric fracture of right femur, subsequent encounter for closed fracture with routine healing: Secondary | ICD-10-CM | POA: Diagnosis not present

## 2020-01-23 DIAGNOSIS — G4700 Insomnia, unspecified: Secondary | ICD-10-CM | POA: Diagnosis not present

## 2020-01-23 DIAGNOSIS — I058 Other rheumatic mitral valve diseases: Secondary | ICD-10-CM | POA: Diagnosis not present

## 2020-01-23 DIAGNOSIS — M810 Age-related osteoporosis without current pathological fracture: Secondary | ICD-10-CM | POA: Diagnosis not present

## 2020-01-23 DIAGNOSIS — K5903 Drug induced constipation: Secondary | ICD-10-CM | POA: Diagnosis not present

## 2020-01-25 DIAGNOSIS — I10 Essential (primary) hypertension: Secondary | ICD-10-CM | POA: Diagnosis not present

## 2020-01-25 DIAGNOSIS — M810 Age-related osteoporosis without current pathological fracture: Secondary | ICD-10-CM | POA: Diagnosis not present

## 2020-01-25 DIAGNOSIS — I058 Other rheumatic mitral valve diseases: Secondary | ICD-10-CM | POA: Diagnosis not present

## 2020-01-25 DIAGNOSIS — K5903 Drug induced constipation: Secondary | ICD-10-CM | POA: Diagnosis not present

## 2020-01-25 DIAGNOSIS — S72141D Displaced intertrochanteric fracture of right femur, subsequent encounter for closed fracture with routine healing: Secondary | ICD-10-CM | POA: Diagnosis not present

## 2020-01-25 DIAGNOSIS — D62 Acute posthemorrhagic anemia: Secondary | ICD-10-CM | POA: Diagnosis not present

## 2020-01-26 ENCOUNTER — Telehealth: Payer: Self-pay | Admitting: *Deleted

## 2020-01-26 DIAGNOSIS — I10 Essential (primary) hypertension: Secondary | ICD-10-CM | POA: Diagnosis not present

## 2020-01-26 DIAGNOSIS — I058 Other rheumatic mitral valve diseases: Secondary | ICD-10-CM | POA: Diagnosis not present

## 2020-01-26 DIAGNOSIS — D62 Acute posthemorrhagic anemia: Secondary | ICD-10-CM | POA: Diagnosis not present

## 2020-01-26 DIAGNOSIS — K5903 Drug induced constipation: Secondary | ICD-10-CM | POA: Diagnosis not present

## 2020-01-26 DIAGNOSIS — S72141D Displaced intertrochanteric fracture of right femur, subsequent encounter for closed fracture with routine healing: Secondary | ICD-10-CM | POA: Diagnosis not present

## 2020-01-26 DIAGNOSIS — M810 Age-related osteoporosis without current pathological fracture: Secondary | ICD-10-CM | POA: Diagnosis not present

## 2020-01-26 NOTE — Telephone Encounter (Signed)
Sharyn Lull OT from [facility] called for POC 1wk4.  Approval given.

## 2020-01-27 DIAGNOSIS — I058 Other rheumatic mitral valve diseases: Secondary | ICD-10-CM | POA: Diagnosis not present

## 2020-01-27 DIAGNOSIS — G4700 Insomnia, unspecified: Secondary | ICD-10-CM | POA: Diagnosis not present

## 2020-01-27 DIAGNOSIS — M810 Age-related osteoporosis without current pathological fracture: Secondary | ICD-10-CM | POA: Diagnosis not present

## 2020-01-27 DIAGNOSIS — S72001A Fracture of unspecified part of neck of right femur, initial encounter for closed fracture: Secondary | ICD-10-CM | POA: Diagnosis not present

## 2020-01-27 DIAGNOSIS — S72141D Displaced intertrochanteric fracture of right femur, subsequent encounter for closed fracture with routine healing: Secondary | ICD-10-CM | POA: Diagnosis not present

## 2020-01-27 DIAGNOSIS — D62 Acute posthemorrhagic anemia: Secondary | ICD-10-CM | POA: Diagnosis not present

## 2020-01-27 DIAGNOSIS — M80051A Age-related osteoporosis with current pathological fracture, right femur, initial encounter for fracture: Secondary | ICD-10-CM | POA: Diagnosis not present

## 2020-01-27 DIAGNOSIS — M25551 Pain in right hip: Secondary | ICD-10-CM | POA: Diagnosis not present

## 2020-01-27 DIAGNOSIS — K5903 Drug induced constipation: Secondary | ICD-10-CM | POA: Diagnosis not present

## 2020-01-27 DIAGNOSIS — I10 Essential (primary) hypertension: Secondary | ICD-10-CM | POA: Diagnosis not present

## 2020-02-03 DIAGNOSIS — I10 Essential (primary) hypertension: Secondary | ICD-10-CM | POA: Diagnosis not present

## 2020-02-03 DIAGNOSIS — D62 Acute posthemorrhagic anemia: Secondary | ICD-10-CM | POA: Diagnosis not present

## 2020-02-03 DIAGNOSIS — S72141D Displaced intertrochanteric fracture of right femur, subsequent encounter for closed fracture with routine healing: Secondary | ICD-10-CM | POA: Diagnosis not present

## 2020-02-03 DIAGNOSIS — M810 Age-related osteoporosis without current pathological fracture: Secondary | ICD-10-CM | POA: Diagnosis not present

## 2020-02-03 DIAGNOSIS — I058 Other rheumatic mitral valve diseases: Secondary | ICD-10-CM | POA: Diagnosis not present

## 2020-02-03 DIAGNOSIS — K5903 Drug induced constipation: Secondary | ICD-10-CM | POA: Diagnosis not present

## 2020-02-04 DIAGNOSIS — I10 Essential (primary) hypertension: Secondary | ICD-10-CM | POA: Diagnosis not present

## 2020-02-04 DIAGNOSIS — I058 Other rheumatic mitral valve diseases: Secondary | ICD-10-CM | POA: Diagnosis not present

## 2020-02-04 DIAGNOSIS — S72141D Displaced intertrochanteric fracture of right femur, subsequent encounter for closed fracture with routine healing: Secondary | ICD-10-CM | POA: Diagnosis not present

## 2020-02-04 DIAGNOSIS — K5903 Drug induced constipation: Secondary | ICD-10-CM | POA: Diagnosis not present

## 2020-02-04 DIAGNOSIS — D62 Acute posthemorrhagic anemia: Secondary | ICD-10-CM | POA: Diagnosis not present

## 2020-02-04 DIAGNOSIS — M810 Age-related osteoporosis without current pathological fracture: Secondary | ICD-10-CM | POA: Diagnosis not present

## 2020-02-05 DIAGNOSIS — D62 Acute posthemorrhagic anemia: Secondary | ICD-10-CM | POA: Diagnosis not present

## 2020-02-05 DIAGNOSIS — K5903 Drug induced constipation: Secondary | ICD-10-CM | POA: Diagnosis not present

## 2020-02-05 DIAGNOSIS — M810 Age-related osteoporosis without current pathological fracture: Secondary | ICD-10-CM | POA: Diagnosis not present

## 2020-02-05 DIAGNOSIS — I058 Other rheumatic mitral valve diseases: Secondary | ICD-10-CM | POA: Diagnosis not present

## 2020-02-05 DIAGNOSIS — I10 Essential (primary) hypertension: Secondary | ICD-10-CM | POA: Diagnosis not present

## 2020-02-05 DIAGNOSIS — S72141D Displaced intertrochanteric fracture of right femur, subsequent encounter for closed fracture with routine healing: Secondary | ICD-10-CM | POA: Diagnosis not present

## 2020-02-09 ENCOUNTER — Ambulatory Visit (INDEPENDENT_AMBULATORY_CARE_PROVIDER_SITE_OTHER): Payer: Medicare Other

## 2020-02-09 ENCOUNTER — Other Ambulatory Visit: Payer: Self-pay

## 2020-02-09 ENCOUNTER — Encounter: Payer: Self-pay | Admitting: Orthopaedic Surgery

## 2020-02-09 ENCOUNTER — Ambulatory Visit (INDEPENDENT_AMBULATORY_CARE_PROVIDER_SITE_OTHER): Payer: Medicare Other | Admitting: Orthopaedic Surgery

## 2020-02-09 DIAGNOSIS — M25551 Pain in right hip: Secondary | ICD-10-CM

## 2020-02-09 DIAGNOSIS — S72144D Nondisplaced intertrochanteric fracture of right femur, subsequent encounter for closed fracture with routine healing: Secondary | ICD-10-CM

## 2020-02-09 NOTE — Progress Notes (Signed)
Post-Op Visit Note   Patient: Alison Rivera           Date of Birth: [birth year]           MRN: [id number] Visit Date: 02/09/2020 PCP: Martha Clan, MD   Assessment & Plan:  Chief Complaint:  Chief Complaint  Patient presents with  . Right Hip - Routine Post Op   Visit Diagnoses:  1. Pain in right hip   2. Closed nondisplaced intertrochanteric fracture of right femur with routine healing, subsequent encounter     Plan:   Lattie is approximately 5 weeks status post cephalomedullary fixation of right intertrochanteric fracture.  She is doing well overall.  She has been ambulating with a walker.  She is doing laundry and cleaning the house.  She takes Tylenol at nighttime when she has pain.  Right hip shows fully healed surgical scars.  She is ambulating with a walker without any apparent pain.  X-rays demonstrate continued healing and stable fixation without any complications.  Lacey is doing very well for 5 weeks status post surgery.  She will continue to do physical therapy and work on strengthening.  We will see her back in 6 weeks with repeat two-view x-rays of the right hip.  I have asked her to add calcium to her supplements.  She should continue to not take Fosamax as this may slow fracture healing.  Follow-Up Instructions: Return in about 6 weeks (around 03/22/2020).   Orders:  Orders Placed This Encounter  Procedures  . XR HIP UNILAT W OR W/O PELVIS 2-3 VIEWS RIGHT   No orders of the defined types were placed in this encounter.   Imaging: XR HIP UNILAT W OR W/O PELVIS 2-3 VIEWS RIGHT  Result Date: 02/09/2020 Stable cephalomedullary fixation of intertrochanteric fracture without complications.   PMFS History: Patient Active Problem List   Diagnosis Date Noted  . History of open reduction and internal fixation (ORIF) procedure   . Hyponatremia   . Labile blood pressure   . Hypoalbuminemia due to protein-calorie malnutrition ([facility])   . Hyperglycemia   . Sleep  disturbance   . Intertrochanteric fracture of right hip ([facility]) 01/06/2020  . Closed nondisplaced intertrochanteric fracture of right femur ([facility])   . Acute blood loss anemia   . Essential hypertension   . Postoperative pain   . Drug induced constipation   . Closed fracture of femur, intertrochanteric, right, initial encounter ([facility]) 01/04/2020  . Anemia 01/04/2020  . Metabolic acidosis 01/04/2020  . Coagulation test abnormality 01/04/2020  . Anxiety 03/30/2013  . Obsessive-compulsive disorders 03/30/2013  . Essential hypertension, benign 03/30/2013  . Osteoporosis, unspecified 03/30/2013  . Chest pain, atypical   . Mitral valve prolapse   . MIGRAINE HEADACHE 10/29/2006  . HYPERTENSION 10/29/2006  . ATRIAL ARRHYTHMIAS 10/29/2006  . OSTEOPENIA 10/29/2006  . INSOMNIA 10/29/2006  . URINARY FREQUENCY 10/29/2006   Past Medical History:  Diagnosis Date  . Chest pain, atypical   . Lichen planus   . Mitral valve prolapse   . Osteoporosis   . Squamous cell skin cancer     Family History  Problem Relation Age of Onset  . Other Mother        AD  . Congestive Heart Failure Father   . Syncope episode Father   . Parkinson's disease Brother   . Other Brother        Deep Brain Stimulator    Past Surgical History:  Procedure Laterality Date  . im nail right  hip Right 01/04/2020  . INTRAMEDULLARY (IM) NAIL INTERTROCHANTERIC Right 01/04/2020   Procedure: INTRAMEDULLARY (IM) NAIL INTERTROCHANTRIC;  Surgeon: Leandrew Koyanagi, MD;  Location: [facility];  Service: Orthopedics;  Laterality: Right;  . NOSE SURGERY     Social History   Occupational History  . Not on file  Tobacco Use  . Smoking status: Never Smoker  . Smokeless tobacco: Never Used  Vaping Use  . Vaping Use: Never used  Substance and Sexual Activity  . Alcohol use: Yes    Alcohol/week: 5.0 standard drinks    Types: 5 Glasses of wine per week  . Drug use: No  . Sexual activity: Not on file

## 2020-02-25 ENCOUNTER — Encounter: Payer: Medicare Other | Admitting: Physical Medicine & Rehabilitation

## 2020-03-22 ENCOUNTER — Ambulatory Visit (INDEPENDENT_AMBULATORY_CARE_PROVIDER_SITE_OTHER): Payer: Medicare Other | Admitting: Orthopaedic Surgery

## 2020-03-22 ENCOUNTER — Ambulatory Visit: Payer: Self-pay

## 2020-03-22 ENCOUNTER — Encounter: Payer: Self-pay | Admitting: Orthopaedic Surgery

## 2020-03-22 DIAGNOSIS — S72144D Nondisplaced intertrochanteric fracture of right femur, subsequent encounter for closed fracture with routine healing: Secondary | ICD-10-CM | POA: Diagnosis not present

## 2020-03-22 DIAGNOSIS — G8929 Other chronic pain: Secondary | ICD-10-CM

## 2020-03-22 DIAGNOSIS — M25561 Pain in right knee: Secondary | ICD-10-CM | POA: Diagnosis not present

## 2020-03-22 MED ORDER — BUPIVACAINE HCL 0.5 % IJ SOLN
2.0000 mL | INTRAMUSCULAR | Status: AC | PRN
  Administered 2020-03-22: 2 mL via INTRA_ARTICULAR

## 2020-03-22 MED ORDER — METHYLPREDNISOLONE ACETATE 40 MG/ML IJ SUSP
40.0000 mg | INTRAMUSCULAR | Status: AC | PRN
  Administered 2020-03-22: 40 mg via INTRA_ARTICULAR

## 2020-03-22 MED ORDER — LIDOCAINE HCL 1 % IJ SOLN
2.0000 mL | INTRAMUSCULAR | Status: AC | PRN
  Administered 2020-03-22: 2 mL

## 2020-03-22 NOTE — Progress Notes (Signed)
Office Visit Note   Patient: Alison Rivera           Date of Birth: [birth year]           MRN: [id number] Visit Date: 03/22/2020              Requested by: Marton Redwood, MD [address] [place],  [place] [address] PCP: Marton Redwood, MD   Assessment & Plan: Visit Diagnoses:  1. Closed nondisplaced intertrochanteric fracture of right femur with routine healing, subsequent encounter   2. Chronic pain of right knee     Plan: Alison Rivera is 3 months status post IM nail right intertrochanteric fracture and right knee DJD.  I do think that the right knee is slowing down her progress so offered a cortisone injection which she agreed to and tolerated well.  I reviewed the x-rays in detail and they do show persistent fracture line concerning for delayed union therefore we will need to order a bone stimulator for her.  We will also obtain calcium, vitamin D, CBC to check her hemoglobin for potential reason for fatigue.  We will see her back in 2 months.  We will notify her of the results of the lab work.  Follow-Up Instructions: Return in about 2 months (around 05/20/2020).   Orders:  Orders Placed This Encounter  Procedures  . XR HIP UNILAT W OR W/O PELVIS 2-3 VIEWS RIGHT  . Vitamin D (25 hydroxy)  . Calcium  . HgB A1c  . Ambulatory referral to Physical Therapy   No orders of the defined types were placed in this encounter.     Procedures: Large Joint Inj: R knee on 03/22/2020 7:26 PM Indications: pain Details: 22 G needle  Arthrogram: No  Medications: 40 mg methylPREDNISolone acetate 40 MG/ML; 2 mL lidocaine 1 %; 2 mL bupivacaine 0.5 % Consent was given by the patient. Patient was prepped and draped in the usual sterile fashion.       Clinical Data: No additional findings.   Subjective: Chief Complaint  Patient presents with  . Right Hip - Routine Post Op    Alison Rivera is nearly 62-month status post cephalomedullary fixation of intertrochanteric fracture.  She is still ambulating  with a walker.  She has some mild pain with weightbearing and ambulation but no pain at rest.  She has finished home health PT.  She has right knee pain as well due to pre-existing osteoarthritis and this is slightly hindering her ability to be more active.  She also feels sluggish and fatigued as well.   Review of Systems   Objective: Vital Signs: There were no vitals taken for this visit.  Physical Exam  Ortho Exam Right hip shows fully healed surgical scars.  She has no pain with range of motion.  She ambulates with a rolling walker with a slight limp.  No significant pain.  Right knee shows no joint effusion.  2+ crepitus with range of motion.  Collaterals and cruciates are stable. Specialty Comments:  No specialty comments available.  Imaging: XR HIP UNILAT W OR W/O PELVIS 2-3 VIEWS RIGHT  Result Date: 03/22/2020 Stable cephalomedullary implant without any complications.  Persistent fracture lucency    PMFS History: Patient Active Problem List   Diagnosis Date Noted  . History of open reduction and internal fixation (ORIF) procedure   . Hyponatremia   . Labile blood pressure   . Hypoalbuminemia due to protein-calorie malnutrition ([place])   . Hyperglycemia   . Sleep disturbance   .  Intertrochanteric fracture of right hip ([place]) 01/06/2020  . Closed nondisplaced intertrochanteric fracture of right femur ([facility])   . Acute blood loss anemia   . Essential hypertension   . Postoperative pain   . Drug induced constipation   . Closed fracture of femur, intertrochanteric, right, initial encounter ([facility]) 01/04/2020  . Anemia 01/04/2020  . Metabolic acidosis 32/91/9166  . Coagulation test abnormality 01/04/2020  . Anxiety 03/30/2013  . Obsessive-compulsive disorders 03/30/2013  . Essential hypertension, benign 03/30/2013  . Osteoporosis, unspecified 03/30/2013  . Chest pain, atypical   . Mitral valve prolapse   . MIGRAINE HEADACHE 10/29/2006  . HYPERTENSION 10/29/2006  .  ATRIAL ARRHYTHMIAS 10/29/2006  . OSTEOPENIA 10/29/2006  . INSOMNIA 10/29/2006  . URINARY FREQUENCY 10/29/2006   Past Medical History:  Diagnosis Date  . Chest pain, atypical   . Lichen planus   . Mitral valve prolapse   . Osteoporosis   . Squamous cell skin cancer     Family History  Problem Relation Age of Onset  . Other Mother        AD  . Congestive Heart Failure Father   . Syncope episode Father   . Parkinson's disease Brother   . Other Brother        Deep Brain Stimulator    Past Surgical History:  Procedure Laterality Date  . im nail right hip Right 01/04/2020  . INTRAMEDULLARY (IM) NAIL INTERTROCHANTERIC Right 01/04/2020   Procedure: INTRAMEDULLARY (IM) NAIL INTERTROCHANTRIC;  Surgeon: Leandrew Koyanagi, MD;  Location: [facility];  Service: Orthopedics;  Laterality: Right;  . NOSE SURGERY     Social History   Occupational History  . Not on file  Tobacco Use  . Smoking status: Never Smoker  . Smokeless tobacco: Never Used  Vaping Use  . Vaping Use: Never used  Substance and Sexual Activity  . Alcohol use: Yes    Alcohol/week: 5.0 standard drinks    Types: 5 Glasses of wine per week  . Drug use: No  . Sexual activity: Not on file

## 2020-03-23 LAB — HEMOGLOBIN A1C
Hgb A1c MFr Bld: 5.7 % of total Hgb — ABNORMAL HIGH (ref <5.7)
Mean Plasma Glucose: 117 mg/dL
eAG (mmol/L): 6.5 mmol/L

## 2020-03-23 LAB — VITAMIN D 25 HYDROXY (VIT D DEFICIENCY, FRACTURES): Vit D, 25-Hydroxy: 69 ng/mL (ref 30–100)

## 2020-03-23 LAB — CALCIUM: Calcium: 10 mg/dL (ref 8.6–10.4)

## 2020-03-25 ENCOUNTER — Telehealth: Payer: Self-pay

## 2020-03-25 NOTE — Telephone Encounter (Signed)
Called [facility] to see if they added the test

## 2020-03-25 NOTE — Telephone Encounter (Signed)
Patient called she is requesting a call back regarding the results from her lab call [phone]

## 2020-03-30 NOTE — Telephone Encounter (Signed)
  Per Dr. Erlinda Hong "I just want to clarify that a Hg level was sent and not a HgA1c.  I believe she wanted a plain Hg level to check for anemia and cause for fatigue, not A1c.  Can you check to see that the right lab was drawn and sent?  Thanks.   "    I called [facility] they state they added same test code Hemoglobin A1c. I advised them that was not correct it should've been plain Hg they state it was too late to add test.    Called patient to let her know.

## 2020-04-05 ENCOUNTER — Ambulatory Visit (INDEPENDENT_AMBULATORY_CARE_PROVIDER_SITE_OTHER): Payer: Medicare Other

## 2020-04-05 ENCOUNTER — Ambulatory Visit (INDEPENDENT_AMBULATORY_CARE_PROVIDER_SITE_OTHER): Payer: Medicare Other | Admitting: Orthopaedic Surgery

## 2020-04-05 ENCOUNTER — Other Ambulatory Visit: Payer: Self-pay

## 2020-04-05 DIAGNOSIS — M25551 Pain in right hip: Secondary | ICD-10-CM | POA: Diagnosis not present

## 2020-04-05 NOTE — Progress Notes (Signed)
Post-Op Visit Note   Patient: Alison Rivera           Date of Birth: May 07, 1931           MRN: [id number] Visit Date: 04/05/2020 PCP: Marton Redwood, MD   Assessment & Plan:  Chief Complaint:  Chief Complaint  Patient presents with  . Other    Xray only   Visit Diagnoses:  1. Pain in right hip     Plan:   Ms. Karney is 3 months status post IM nail right intertrochanteric fracture.  She comes back today for repeat images per Medicare guidelines for approval of bone stimulator.  X-rays continue to demonstrate fracture lucency of the main fracture.  No hardware complications.  At this point we will need to order a bone stimulator to help promote fracture healing.  We will see her back in about 6 weeks with repeat two-view x-ray of the right hip.  Follow-Up Instructions: Return in about 6 weeks (around 05/17/2020).   Orders:  Orders Placed This Encounter  Procedures  . XR HIP UNILAT W OR W/O PELVIS 2-3 VIEWS RIGHT   No orders of the defined types were placed in this encounter.   Imaging: XR HIP UNILAT W OR W/O PELVIS 2-3 VIEWS RIGHT  Result Date: 04/05/2020 No hardware complications.  Persistent fracture lucency   PMFS History: Patient Active Problem List   Diagnosis Date Noted  . History of open reduction and internal fixation (ORIF) procedure   . Hyponatremia   . Labile blood pressure   . Hypoalbuminemia due to protein-calorie malnutrition ([facility])   . Hyperglycemia   . Sleep disturbance   . Intertrochanteric fracture of right hip ([facility]) 01/06/2020  . Closed nondisplaced intertrochanteric fracture of right femur ([facility])   . Acute blood loss anemia   . Essential hypertension   . Postoperative pain   . Drug induced constipation   . Closed fracture of femur, intertrochanteric, right, initial encounter ([facility]) 01/04/2020  . Anemia 01/04/2020  . Metabolic acidosis 23/76/2831  . Coagulation test abnormality 01/04/2020  . Anxiety 03/30/2013  . Obsessive-compulsive  disorders 03/30/2013  . Essential hypertension, benign 03/30/2013  . Osteoporosis, unspecified 03/30/2013  . Chest pain, atypical   . Mitral valve prolapse   . MIGRAINE HEADACHE 10/29/2006  . HYPERTENSION 10/29/2006  . ATRIAL ARRHYTHMIAS 10/29/2006  . OSTEOPENIA 10/29/2006  . INSOMNIA 10/29/2006  . URINARY FREQUENCY 10/29/2006   Past Medical History:  Diagnosis Date  . Chest pain, atypical   . Lichen planus   . Mitral valve prolapse   . Osteoporosis   . Squamous cell skin cancer     Family History  Problem Relation Age of Onset  . Other Mother        AD  . Congestive Heart Failure Father   . Syncope episode Father   . Parkinson's disease Brother   . Other Brother        Deep Brain Stimulator    Past Surgical History:  Procedure Laterality Date  . im nail right hip Right 01/04/2020  . INTRAMEDULLARY (IM) NAIL INTERTROCHANTERIC Right 01/04/2020   Procedure: INTRAMEDULLARY (IM) NAIL INTERTROCHANTRIC;  Surgeon: Leandrew Koyanagi, MD;  Location: [facility];  Service: Orthopedics;  Laterality: Right;  . NOSE SURGERY     Social History   Occupational History  . Not on file  Tobacco Use  . Smoking status: Never Smoker  . Smokeless tobacco: Never Used  Vaping Use  . Vaping Use: Never used  Substance and  Sexual Activity  . Alcohol use: Yes    Alcohol/week: 5.0 standard drinks    Types: 5 Glasses of wine per week  . Drug use: No  . Sexual activity: Not on file

## 2020-04-19 ENCOUNTER — Ambulatory Visit: Payer: Medicare Other | Admitting: Physical Therapy

## 2020-04-19 ENCOUNTER — Telehealth: Payer: Self-pay | Admitting: Orthopaedic Surgery

## 2020-04-19 NOTE — Telephone Encounter (Signed)
Patient called. She wants to know if she should start PT or not. Says she is not healing. Her call back number is [phone]

## 2020-04-19 NOTE — Telephone Encounter (Signed)
Bone stimulator is most important for healing right now, but if she feels she needs more pt from mobility/strength/rom standpoint that I would like for [place] to arrange this for her

## 2020-04-21 ENCOUNTER — Other Ambulatory Visit: Payer: Self-pay

## 2020-04-21 DIAGNOSIS — S72144D Nondisplaced intertrochanteric fracture of right femur, subsequent encounter for closed fracture with routine healing: Secondary | ICD-10-CM

## 2020-04-21 NOTE — Telephone Encounter (Signed)
PT order made

## 2020-05-17 ENCOUNTER — Encounter: Payer: Self-pay | Admitting: Orthopaedic Surgery

## 2020-05-17 ENCOUNTER — Ambulatory Visit: Payer: Self-pay

## 2020-05-17 ENCOUNTER — Ambulatory Visit (INDEPENDENT_AMBULATORY_CARE_PROVIDER_SITE_OTHER): Payer: Medicare Other | Admitting: Orthopaedic Surgery

## 2020-05-17 DIAGNOSIS — S72144D Nondisplaced intertrochanteric fracture of right femur, subsequent encounter for closed fracture with routine healing: Secondary | ICD-10-CM

## 2020-05-17 NOTE — Progress Notes (Signed)
Office Visit Note   Patient: Alison Rivera           Date of Birth: 1931/09/18           MRN: [id number] Visit Date: 05/17/2020              Requested by: Alison Rivera., MD [address] [place],  [place] [address] PCP: Alison Rivera., MD   Assessment & Plan: Visit Diagnoses:  1. Closed nondisplaced intertrochanteric fracture of right femur with routine healing, subsequent encounter     Plan: Impression is delayed union right intertrochanteric fracture.  We had another discussion on bone stimulator and outpatient PT.  After consideration of her options she is agreeable to a bone stimulator.  I will have Alison Rivera reach back out to the patient.  We will see her back in 2 months with two-view x-rays of the right hip.  Follow-Up Instructions: Return in about 2 months (around 07/17/2020).   Orders:  Orders Placed This Encounter  Procedures  . XR HIP UNILAT W OR W/O PELVIS 2-3 VIEWS RIGHT   No orders of the defined types were placed in this encounter.     Procedures: No procedures performed   Clinical Data: No additional findings.   Subjective: Chief Complaint  Patient presents with  . Right Hip - Follow-up    [name] is status post right intertrochanteric hip fracture on 01/04/2020.  Overall she feels like she is not getting better.  She previously declined a bone stimulator device.  Currently takes Advil as needed which does help.  She is ambulating with a walker.  She had in-home PT but never got outpatient PT.   Review of Systems   Objective: Vital Signs: There were no vitals taken for this visit.  Physical Exam  Ortho Exam Right hip shows mild restriction range of motion with mild discomfort more so with internal rotation.  She is ambulating with a rolling walker with a mild limp. Specialty Comments:  No specialty comments available.  Imaging: XR HIP UNILAT W OR W/O PELVIS 2-3 VIEWS RIGHT  Result Date: 05/17/2020 Stable IM cephalomedullary  fixation.  Fracture lucency is persistent on the medial side.  There is no hardware complications.  The lateral portion of the intertrochanteric fracture appears to be healing.    PMFS History: Patient Active Problem List   Diagnosis Date Noted  . History of open reduction and internal fixation (ORIF) procedure   . Hyponatremia   . Labile blood pressure   . Hypoalbuminemia due to protein-calorie malnutrition ([facility])   . Hyperglycemia   . Sleep disturbance   . Intertrochanteric fracture of right hip ([facility]) 01/06/2020  . Closed nondisplaced intertrochanteric fracture of right femur ([facility])   . Acute blood loss anemia   . Essential hypertension   . Postoperative pain   . Drug induced constipation   . Closed fracture of femur, intertrochanteric, right, initial encounter ([facility]) 01/04/2020  . Anemia 01/04/2020  . Metabolic acidosis 73/53/2992  . Coagulation test abnormality 01/04/2020  . Anxiety 03/30/2013  . Obsessive-compulsive disorders 03/30/2013  . Essential hypertension, benign 03/30/2013  . Osteoporosis, unspecified 03/30/2013  . Chest pain, atypical   . Mitral valve prolapse   . MIGRAINE HEADACHE 10/29/2006  . HYPERTENSION 10/29/2006  . ATRIAL ARRHYTHMIAS 10/29/2006  . OSTEOPENIA 10/29/2006  . INSOMNIA 10/29/2006  . URINARY FREQUENCY 10/29/2006   Past Medical History:  Diagnosis Date  . Chest pain, atypical   . Lichen planus   .  Mitral valve prolapse   . Osteoporosis   . Squamous cell skin cancer     Family History  Problem Relation Age of Onset  . Other Mother        AD  . Congestive Heart Failure Father   . Syncope episode Father   . Parkinson's disease Brother   . Other Brother        Deep Brain Stimulator    Past Surgical History:  Procedure Laterality Date  . im nail right hip Right 01/04/2020  . INTRAMEDULLARY (IM) NAIL INTERTROCHANTERIC Right 01/04/2020   Procedure: INTRAMEDULLARY (IM) NAIL INTERTROCHANTRIC;  Surgeon: Alison Koyanagi, MD;  Location: [facility];   Service: Orthopedics;  Laterality: Right;  . NOSE SURGERY     Social History   Occupational History  . Not on file  Tobacco Use  . Smoking status: Never Smoker  . Smokeless tobacco: Never Used  Vaping Use  . Vaping Use: Never used  Substance and Sexual Activity  . Alcohol use: Yes    Alcohol/week: 5.0 standard drinks    Types: 5 Glasses of wine per week  . Drug use: No  . Sexual activity: Not on file

## 2020-06-03 LAB — IFOBT (OCCULT BLOOD): IFOBT: NEGATIVE

## 2020-07-19 ENCOUNTER — Ambulatory Visit: Payer: Medicare Other | Admitting: Orthopaedic Surgery

## 2020-07-19 ENCOUNTER — Ambulatory Visit: Payer: Self-pay

## 2020-07-19 ENCOUNTER — Encounter: Payer: Self-pay | Admitting: Orthopaedic Surgery

## 2020-07-19 DIAGNOSIS — S72144D Nondisplaced intertrochanteric fracture of right femur, subsequent encounter for closed fracture with routine healing: Secondary | ICD-10-CM | POA: Diagnosis not present

## 2020-07-19 NOTE — Progress Notes (Signed)
Post-Op Visit Note   Patient: Alison Rivera           Date of Birth: 12-Oct-1931           MRN: [id number] Visit Date: 07/19/2020 PCP: Ginger Organ., MD   Assessment & Plan:  Chief Complaint:  Chief Complaint  Patient presents with   Right Leg - Fracture, Follow-up   Visit Diagnoses:  1. Closed nondisplaced intertrochanteric fracture of right femur with routine healing, subsequent encounter     Plan: Aliyha is 7 months status post IM nail right intertrochanteric fracture on January 04, 2020.  She has stopped using the walker since we last saw her 2 months ago.  Not taking anything for pain.  She has been driving and going to the grocery store on her own.  She is very independent.  Right hip range of motion is pain-free.  X-rays demonstrate some slow progression of the fracture healing.  She is ambulating with a slow gait.  She has no pain with weightbearing.  Based on findings she is clinically improving and healing therefore I am very happy with her progress.  We will provide her with a cane today for ambulation outside the home if she needs the stability.  She will continue to use the bone stimulator until it stops the therapy.  We will see her back as needed since she is doing well.  Follow-Up Instructions: Return if symptoms worsen or fail to improve.   Orders:  Orders Placed This Encounter  Procedures   XR HIP UNILAT W OR W/O PELVIS 2-3 VIEWS RIGHT   No orders of the defined types were placed in this encounter.   Imaging: XR HIP UNILAT W OR W/O PELVIS 2-3 VIEWS RIGHT  Result Date: 07/19/2020 There has been a small improvement in fracture consolidation best appreciated on the frog-lateral view.   PMFS History: Patient Active Problem List   Diagnosis Date Noted   History of open reduction and internal fixation (ORIF) procedure    Hyponatremia    Labile blood pressure    Hypoalbuminemia due to protein-calorie malnutrition ([facility])    Hyperglycemia    Sleep  disturbance    Intertrochanteric fracture of right hip ([facility]) 01/06/2020   Closed nondisplaced intertrochanteric fracture of right femur ([facility])    Acute blood loss anemia    Essential hypertension    Postoperative pain    Drug induced constipation    Closed fracture of femur, intertrochanteric, right, initial encounter ([facility]) 01/04/2020   Anemia 61/95/0932   Metabolic acidosis 67/01/4579   Coagulation test abnormality 01/04/2020   Anxiety 03/30/2013   Obsessive-compulsive disorders 03/30/2013   Essential hypertension, benign 03/30/2013   Osteoporosis, unspecified 03/30/2013   Chest pain, atypical    Mitral valve prolapse    MIGRAINE HEADACHE 10/29/2006   HYPERTENSION 10/29/2006   ATRIAL ARRHYTHMIAS 10/29/2006   OSTEOPENIA 10/29/2006   INSOMNIA 10/29/2006   URINARY FREQUENCY 10/29/2006   Past Medical History:  Diagnosis Date   Chest pain, atypical    Lichen planus    Mitral valve prolapse    Osteoporosis    Squamous cell skin cancer     Family History  Problem Relation Age of Onset   Other Mother        AD   Congestive Heart Failure Father    Syncope episode Father    Parkinson's disease Brother    Other Brother        Sports coach    Past Surgical  History:  Procedure Laterality Date   im nail right hip Right 01/04/2020   INTRAMEDULLARY (IM) NAIL INTERTROCHANTERIC Right 01/04/2020   Procedure: INTRAMEDULLARY (IM) NAIL INTERTROCHANTRIC;  Surgeon: Leandrew Koyanagi, MD;  Location: [facility];  Service: Orthopedics;  Laterality: Right;   NOSE SURGERY     Social History   Occupational History   Not on file  Tobacco Use   Smoking status: Never   Smokeless tobacco: Never  Vaping Use   Vaping Use: Never used  Substance and Sexual Activity   Alcohol use: Yes    Alcohol/week: 5.0 standard drinks    Types: 5 Glasses of wine per week   Drug use: No   Sexual activity: Not on file

## 2020-12-19 ENCOUNTER — Ambulatory Visit (HOSPITAL_COMMUNITY)
Admission: RE | Admit: 2020-12-19 | Discharge: 2020-12-19 | Disposition: A | Discharge status: Home / Self Care | Payer: Medicare Other | Source: Ambulatory Visit | Attending: Physician Assistant | Admitting: Physician Assistant

## 2020-12-19 ENCOUNTER — Other Ambulatory Visit: Payer: Self-pay

## 2020-12-19 ENCOUNTER — Encounter (HOSPITAL_COMMUNITY): Payer: Self-pay | Admitting: Physician Assistant

## 2020-12-19 VITALS — BP 124/86 | HR 118 | Ht 64.0 in | Wt 120.0 lb

## 2020-12-19 DIAGNOSIS — D6869 Other thrombophilia: Secondary | ICD-10-CM | POA: Diagnosis not present

## 2020-12-19 DIAGNOSIS — I4892 Unspecified atrial flutter: Secondary | ICD-10-CM | POA: Insufficient documentation

## 2020-12-19 DIAGNOSIS — I10 Essential (primary) hypertension: Secondary | ICD-10-CM | POA: Diagnosis not present

## 2020-12-19 DIAGNOSIS — I341 Nonrheumatic mitral (valve) prolapse: Secondary | ICD-10-CM | POA: Diagnosis not present

## 2020-12-19 DIAGNOSIS — F429 Obsessive-compulsive disorder, unspecified: Secondary | ICD-10-CM | POA: Insufficient documentation

## 2020-12-19 DIAGNOSIS — Z79899 Other long term (current) drug therapy: Secondary | ICD-10-CM | POA: Insufficient documentation

## 2020-12-19 DIAGNOSIS — I340 Nonrheumatic mitral (valve) insufficiency: Secondary | ICD-10-CM | POA: Insufficient documentation

## 2020-12-19 DIAGNOSIS — I484 Atypical atrial flutter: Secondary | ICD-10-CM | POA: Insufficient documentation

## 2020-12-19 DIAGNOSIS — Z7901 Long term (current) use of anticoagulants: Secondary | ICD-10-CM | POA: Diagnosis not present

## 2020-12-19 MED ORDER — APIXABAN 2.5 MG PO TABS
2.5000 mg | ORAL_TABLET | Freq: Two times a day (BID) | ORAL | 3 refills | Status: DC
Start: 2020-12-19 — End: 2021-05-29

## 2020-12-19 NOTE — Patient Instructions (Signed)
Cardioversion scheduled for Thursday, December 1st  - Come to afib clinic for labs at [facility] at the [facility] and go to admitting at 930am  - Do not eat or drink anything after midnight the night prior to your procedure.  - Take all your morning medication (except diabetic medications) with a sip of water prior to arrival.  - You will not be able to drive home after your procedure.  - Do NOT miss any doses of your blood thinner - if you should miss a dose please notify our office immediately.  - If you feel as if you go back into normal rhythm prior to scheduled cardioversion, please notify our office immediately. If your procedure is canceled in the cardioversion suite you will be charged a cancellation fee. Patients will be asked to: to mask in public and hand hygiene (no longer quarantine) in the 3 days prior to surgery, to report if any COVID-19-like illness or household contacts to COVID-19 to determine need for testing

## 2020-12-19 NOTE — H&P (View-Only) (Signed)
Primary Care Physician: Ginger Organ., MD Primary Cardiologist: Dr Marlou Porch (remotely) Primary Electrophysiologist: none Referring Physician: Reginold Agent NP   Alison Rivera is a 87 y.o. female with a history of HTN, OCD, MVP, atrial fibrillation who presents for consultation in the [facility]. The patient was initially diagnosed with atrial flutter 12/15/20 after presenting to her PCP with symptoms of tachypalpitations and mild SOB which had been ongoing for 2 weeks. Patient check on an Alpine which showed rapid heart rates. Her metoprolol was increased and she was started on Eliquis for a CHADS2VASC score of 4. There were no specific triggers that she could identify. She does drink alcohol occasionally.   Today, she denies symptoms of chest pain, orthopnea, PND, lower extremity edema, dizziness, presyncope, syncope, snoring, daytime somnolence, bleeding, or neurologic sequela. The patient is tolerating medications without difficulties and is otherwise without complaint today.    Atrial Fibrillation Risk Factors:  she does not have symptoms or diagnosis of sleep apnea. she does not have a history of rheumatic fever. she does have a history of alcohol use. The patient does not have a history of early familial atrial fibrillation or other arrhythmias.  she has a BMI of Body mass index is 20.6 kg/m.Marland Kitchen Filed Weights   12/19/20 1425  Weight: 54.4 kg    Family History  Problem Relation Age of Onset   Other Mother        AD   Congestive Heart Failure Father    Syncope episode Father    Parkinson's disease Brother    Other Brother        Sports coach     Atrial Fibrillation Management history:  Previous antiarrhythmic drugs: none Previous cardioversions: none Previous ablations: none CHADS2VASC score: 4 Anticoagulation history: Eliquis   Past Medical History:  Diagnosis Date   Chest pain, atypical    Lichen planus     Mitral valve prolapse    Osteoporosis    Squamous cell skin cancer    Past Surgical History:  Procedure Laterality Date   im nail right hip Right 01/04/2020   INTRAMEDULLARY (IM) NAIL INTERTROCHANTERIC Right 01/04/2020   Procedure: INTRAMEDULLARY (IM) NAIL INTERTROCHANTRIC;  Surgeon: Leandrew Koyanagi, MD;  Location: [facility];  Service: Orthopedics;  Laterality: Right;   NOSE SURGERY      Current Outpatient Medications  Medication Sig Dispense Refill   acetaminophen (TYLENOL) 325 MG tablet Take 2 tablets (650 mg total) by mouth every 6 (six) hours as needed for moderate pain or headache.     alendronate (FOSAMAX) 70 MG tablet Take 70 mg by mouth once a week.     apixaban (ELIQUIS) 2.5 MG TABS tablet Take 2.5 mg by mouth 2 (two) times daily.     cholecalciferol (VITAMIN D) 25 MCG tablet Take 1 tablet (1,000 Units total) by mouth daily. 30 tablet 0   docusate sodium (COLACE) 100 MG capsule Take 1 capsule (100 mg total) by mouth 2 (two) times daily. 10 capsule 0   HYDROcodone-acetaminophen (NORCO/VICODIN) 5-325 MG tablet Take 1-2 tablets by mouth every 6 (six) hours as needed for moderate pain. 30 tablet 0   hydrocortisone 2.5 % cream Apply 1 application topically 2 (two) times daily as needed.     lidocaine (LIDODERM) 5 % Place 1 patch onto the skin daily. Remove & Discard patch within 12 hours or as directed by MD 30 patch 0   methocarbamol (ROBAXIN) 500 MG tablet Take 1  tablet (500 mg total) by mouth every 6 (six) hours as needed for muscle spasms. 60 tablet 0   metoprolol succinate (TOPROL-XL) 25 MG 24 hr tablet Take 1 tablet (25 mg total) by mouth daily. (Patient taking differently: Take 25 mg by mouth 2 (two) times daily.) 30 tablet 0   mirtazapine (REMERON) 15 MG tablet Take 1 tablet (15 mg total) by mouth at bedtime. 30 tablet 0   Multiple Vitamins-Minerals (CENTRUM SILVER) tablet Take 1 tablet by mouth daily.     polyethylene glycol (MIRALAX / GLYCOLAX) 17 g packet Take 17 g by mouth daily.  14 each 0   sertraline (ZOLOFT) 50 MG tablet Take 1 tablet (50 mg total) by mouth daily. 30 tablet 0   vitamin B-12 100 MCG tablet Take 1 tablet (100 mcg total) by mouth daily. 30 tablet 0   zolpidem (AMBIEN) 10 MG tablet Take 5 mg by mouth at bedtime as needed.     No current facility-administered medications for this encounter.    No Known Allergies  Social History   Socioeconomic History   Marital status: Married    Spouse name: Not on file   Number of children: Not on file   Years of education: Not on file   Highest education level: Not on file  Occupational History   Not on file  Tobacco Use   Smoking status: Never   Smokeless tobacco: Never  Vaping Use   Vaping Use: Never used  Substance and Sexual Activity   Alcohol use: Yes    Alcohol/week: 5.0 standard drinks    Types: 5 Glasses of wine per week    Comment: 1-2 glasses of wine 12/19/2020   Drug use: No   Sexual activity: Not on file  Other Topics Concern   Not on file  Social History Narrative   Not on file   Social Determinants of Health   Financial Resource Strain: Not on file  Food Insecurity: Not on file  Transportation Needs: Not on file  Physical Activity: Not on file  Stress: Not on file  Social Connections: Not on file  Intimate Partner Violence: Not on file     ROS- All systems are reviewed and negative except as per the HPI above.  Physical Exam: Vitals:   12/19/20 1425  BP: 124/86  Pulse: (!) 118  Weight: 54.4 kg  Height: 5\' 4"  (1.626 m)    GEN- The patient is a well appearing elderly female, alert and oriented x 3 today.   Head- normocephalic, atraumatic Eyes-  Sclera clear, conjunctiva pink Ears- hearing intact Oropharynx- clear Neck- supple  Lungs- Clear to ausculation bilaterally, normal work of breathing Heart- Regular rate and rhythm, tachycardia, no murmurs, rubs or gallops  GI- soft, NT, ND, + BS Extremities- no clubbing, cyanosis, or edema MS- no significant deformity  or atrophy Skin- no rash or lesion Psych- euthymic mood, full affect Neuro- strength and sensation are intact  Wt Readings from Last 3 Encounters:  12/19/20 54.4 kg  01/20/20 55.7 kg  01/04/20 54.4 kg    EKG today demonstrates  Atrial flutter with predominantly 2:1 block Vent. rate 118 BPM PR interval * ms QRS duration 76 ms QT/QTcB 368/515 ms  Echo 04/15/13 demonstrated  - Left ventricle: The cavity size was normal. Wall thickness    was normal. Systolic function was normal. The estimated    ejection fraction was in the range of 55% to 60%.  - Aortic valve: Mild regurgitation.  - Mitral valve: Mild  anterior leaflet prolapse Mild    regurgitation.  - Left atrium: The atrium was mildly dilated.  - Atrial septum: No defect or patent foramen ovale was    identified.  - Pulmonary arteries: PA peak pressure: 78mm Hg (S).   Epic records are reviewed at length today  CHA2DS2-VASc Score = 4  The patient's score is based upon: CHF History: 0 HTN History: 1 Diabetes History: 0 Stroke History: 0 Vascular Disease History: 0 Age Score: 2 Gender Score: 1      ASSESSMENT AND PLAN: 1. Atrial flutter The patient's CHA2DS2-VASc score is 4, indicating a 4.8% annual risk of stroke.   General education about atrial flutter provided and questions answered. We also discussed her stroke risk and the risks and benefits of anticoagulation. Will arrange for DCCV after 3 weeks of anticoagulation. Continue Eliquis 2.5 mg BID (age >4, weight < 60kg) Continue Toprol 50 mg daily Check echo once back in SR/better rate controlled.   2. Secondary Hypercoagulable State (ICD10:  D68.69) The patient is at significant risk for stroke/thromboembolism based upon her CHA2DS2-VASc Score of 4.  Continue Apixaban (Eliquis).   3. HTN Stable, no changes today.  4. MVP Mild MR on echo 2015. Recheck echo as above.   Follow up in the AF clinic post DCCV.    [facility] [address] [place], [place] [address] [phone] 12/19/2020 2:34 PM

## 2020-12-19 NOTE — Progress Notes (Signed)
Primary Care Physician: Ginger Organ., MD Primary Cardiologist: Dr Marlou Porch (remotely) Primary Electrophysiologist: none Referring Physician: Reginold Agent NP   Alison Rivera is a 87 y.o. female with a history of HTN, OCD, MVP, atrial fibrillation who presents for consultation in the [facility]. The patient was initially diagnosed with atrial flutter 12/15/20 after presenting to her PCP with symptoms of tachypalpitations and mild SOB which had been ongoing for 2 weeks. Patient check on an Alpine which showed rapid heart rates. Her metoprolol was increased and she was started on Eliquis for a CHADS2VASC score of 4. There were no specific triggers that she could identify. She does drink alcohol occasionally.   Today, she denies symptoms of chest pain, orthopnea, PND, lower extremity edema, dizziness, presyncope, syncope, snoring, daytime somnolence, bleeding, or neurologic sequela. The patient is tolerating medications without difficulties and is otherwise without complaint today.    Atrial Fibrillation Risk Factors:  she does not have symptoms or diagnosis of sleep apnea. she does not have a history of rheumatic fever. she does have a history of alcohol use. The patient does not have a history of early familial atrial fibrillation or other arrhythmias.  she has a BMI of Body mass index is 20.6 kg/m.Marland Kitchen Filed Weights   12/19/20 1425  Weight: 54.4 kg    Family History  Problem Relation Age of Onset   Other Mother        AD   Congestive Heart Failure Father    Syncope episode Father    Parkinson's disease Brother    Other Brother        Sports coach     Atrial Fibrillation Management history:  Previous antiarrhythmic drugs: none Previous cardioversions: none Previous ablations: none CHADS2VASC score: 4 Anticoagulation history: Eliquis   Past Medical History:  Diagnosis Date   Chest pain, atypical    Lichen planus     Mitral valve prolapse    Osteoporosis    Squamous cell skin cancer    Past Surgical History:  Procedure Laterality Date   im nail right hip Right 01/04/2020   INTRAMEDULLARY (IM) NAIL INTERTROCHANTERIC Right 01/04/2020   Procedure: INTRAMEDULLARY (IM) NAIL INTERTROCHANTRIC;  Surgeon: Leandrew Koyanagi, MD;  Location: [facility];  Service: Orthopedics;  Laterality: Right;   NOSE SURGERY      Current Outpatient Medications  Medication Sig Dispense Refill   acetaminophen (TYLENOL) 325 MG tablet Take 2 tablets (650 mg total) by mouth every 6 (six) hours as needed for moderate pain or headache.     alendronate (FOSAMAX) 70 MG tablet Take 70 mg by mouth once a week.     apixaban (ELIQUIS) 2.5 MG TABS tablet Take 2.5 mg by mouth 2 (two) times daily.     cholecalciferol (VITAMIN D) 25 MCG tablet Take 1 tablet (1,000 Units total) by mouth daily. 30 tablet 0   docusate sodium (COLACE) 100 MG capsule Take 1 capsule (100 mg total) by mouth 2 (two) times daily. 10 capsule 0   HYDROcodone-acetaminophen (NORCO/VICODIN) 5-325 MG tablet Take 1-2 tablets by mouth every 6 (six) hours as needed for moderate pain. 30 tablet 0   hydrocortisone 2.5 % cream Apply 1 application topically 2 (two) times daily as needed.     lidocaine (LIDODERM) 5 % Place 1 patch onto the skin daily. Remove & Discard patch within 12 hours or as directed by MD 30 patch 0   methocarbamol (ROBAXIN) 500 MG tablet Take 1  tablet (500 mg total) by mouth every 6 (six) hours as needed for muscle spasms. 60 tablet 0   metoprolol succinate (TOPROL-XL) 25 MG 24 hr tablet Take 1 tablet (25 mg total) by mouth daily. (Patient taking differently: Take 25 mg by mouth 2 (two) times daily.) 30 tablet 0   mirtazapine (REMERON) 15 MG tablet Take 1 tablet (15 mg total) by mouth at bedtime. 30 tablet 0   Multiple Vitamins-Minerals (CENTRUM SILVER) tablet Take 1 tablet by mouth daily.     polyethylene glycol (MIRALAX / GLYCOLAX) 17 g packet Take 17 g by mouth daily.  14 each 0   sertraline (ZOLOFT) 50 MG tablet Take 1 tablet (50 mg total) by mouth daily. 30 tablet 0   vitamin B-12 100 MCG tablet Take 1 tablet (100 mcg total) by mouth daily. 30 tablet 0   zolpidem (AMBIEN) 10 MG tablet Take 5 mg by mouth at bedtime as needed.     No current facility-administered medications for this encounter.    No Known Allergies  Social History   Socioeconomic History   Marital status: Married    Spouse name: Not on file   Number of children: Not on file   Years of education: Not on file   Highest education level: Not on file  Occupational History   Not on file  Tobacco Use   Smoking status: Never   Smokeless tobacco: Never  Vaping Use   Vaping Use: Never used  Substance and Sexual Activity   Alcohol use: Yes    Alcohol/week: 5.0 standard drinks    Types: 5 Glasses of wine per week    Comment: 1-2 glasses of wine 12/19/2020   Drug use: No   Sexual activity: Not on file  Other Topics Concern   Not on file  Social History Narrative   Not on file   Social Determinants of Health   Financial Resource Strain: Not on file  Food Insecurity: Not on file  Transportation Needs: Not on file  Physical Activity: Not on file  Stress: Not on file  Social Connections: Not on file  Intimate Partner Violence: Not on file     ROS- All systems are reviewed and negative except as per the HPI above.  Physical Exam: Vitals:   12/19/20 1425  BP: 124/86  Pulse: (!) 118  Weight: 54.4 kg  Height: 5\' 4"  (1.626 m)    GEN- The patient is a well appearing elderly female, alert and oriented x 3 today.   Head- normocephalic, atraumatic Eyes-  Sclera clear, conjunctiva pink Ears- hearing intact Oropharynx- clear Neck- supple  Lungs- Clear to ausculation bilaterally, normal work of breathing Heart- Regular rate and rhythm, tachycardia, no murmurs, rubs or gallops  GI- soft, NT, ND, + BS Extremities- no clubbing, cyanosis, or edema MS- no significant deformity  or atrophy Skin- no rash or lesion Psych- euthymic mood, full affect Neuro- strength and sensation are intact  Wt Readings from Last 3 Encounters:  12/19/20 54.4 kg  01/20/20 55.7 kg  01/04/20 54.4 kg    EKG today demonstrates  Atrial flutter with predominantly 2:1 block Vent. rate 118 BPM PR interval * ms QRS duration 76 ms QT/QTcB 368/515 ms  Echo 04/15/13 demonstrated  - Left ventricle: The cavity size was normal. Wall thickness    was normal. Systolic function was normal. The estimated    ejection fraction was in the range of 55% to 60%.  - Aortic valve: Mild regurgitation.  - Mitral valve: Mild  anterior leaflet prolapse Mild    regurgitation.  - Left atrium: The atrium was mildly dilated.  - Atrial septum: No defect or patent foramen ovale was    identified.  - Pulmonary arteries: PA peak pressure: 78mm Hg (S).   Epic records are reviewed at length today  CHA2DS2-VASc Score = 4  The patient's score is based upon: CHF History: 0 HTN History: 1 Diabetes History: 0 Stroke History: 0 Vascular Disease History: 0 Age Score: 2 Gender Score: 1      ASSESSMENT AND PLAN: 1. Atrial flutter The patient's CHA2DS2-VASc score is 4, indicating a 4.8% annual risk of stroke.   General education about atrial flutter provided and questions answered. We also discussed her stroke risk and the risks and benefits of anticoagulation. Will arrange for DCCV after 3 weeks of anticoagulation. Continue Eliquis 2.5 mg BID (age >4, weight < 60kg) Continue Toprol 50 mg daily Check echo once back in SR/better rate controlled.   2. Secondary Hypercoagulable State (ICD10:  D68.69) The patient is at significant risk for stroke/thromboembolism based upon her CHA2DS2-VASc Score of 4.  Continue Apixaban (Eliquis).   3. HTN Stable, no changes today.  4. MVP Mild MR on echo 2015. Recheck echo as above.   Follow up in the AF clinic post DCCV.    [facility] [address] [place], [place] [address] [phone] 12/19/2020 2:34 PM

## 2020-12-26 ENCOUNTER — Encounter (HOSPITAL_COMMUNITY): Payer: Self-pay | Admitting: Cardiovascular Disease

## 2020-12-27 NOTE — Progress Notes (Signed)
Attempted to obtain medical history via telephone, unable to reach at this time. Unable to voicemail to return pre surgical testing department's phone call.

## 2021-01-05 ENCOUNTER — Ambulatory Visit (HOSPITAL_COMMUNITY): Payer: Medicare Other | Admitting: Certified Registered Nurse Anesthetist

## 2021-01-05 ENCOUNTER — Encounter (HOSPITAL_COMMUNITY)
Admission: RE | Disposition: A | Discharge status: Home / Self Care | Payer: Self-pay | Source: Home / Self Care | Attending: Cardiovascular Disease

## 2021-01-05 ENCOUNTER — Encounter (HOSPITAL_COMMUNITY): Payer: Self-pay | Admitting: Cardiovascular Disease

## 2021-01-05 ENCOUNTER — Ambulatory Visit (HOSPITAL_COMMUNITY)
Admission: RE | Admit: 2021-01-05 | Discharge: 2021-01-05 | Disposition: A | Discharge status: Home / Self Care | Payer: Medicare Other | Attending: Cardiovascular Disease | Admitting: Cardiovascular Disease

## 2021-01-05 ENCOUNTER — Ambulatory Visit (HOSPITAL_COMMUNITY)
Admission: RE | Admit: 2021-01-05 | Discharge: 2021-01-05 | Disposition: A | Discharge status: Home / Self Care | Payer: Medicare Other | Source: Ambulatory Visit | Attending: Physician Assistant | Admitting: Physician Assistant

## 2021-01-05 ENCOUNTER — Other Ambulatory Visit: Payer: Self-pay

## 2021-01-05 DIAGNOSIS — I10 Essential (primary) hypertension: Secondary | ICD-10-CM | POA: Diagnosis not present

## 2021-01-05 DIAGNOSIS — I4891 Unspecified atrial fibrillation: Secondary | ICD-10-CM | POA: Diagnosis not present

## 2021-01-05 DIAGNOSIS — I491 Atrial premature depolarization: Secondary | ICD-10-CM | POA: Diagnosis not present

## 2021-01-05 DIAGNOSIS — I341 Nonrheumatic mitral (valve) prolapse: Secondary | ICD-10-CM | POA: Diagnosis not present

## 2021-01-05 DIAGNOSIS — I4892 Unspecified atrial flutter: Secondary | ICD-10-CM

## 2021-01-05 DIAGNOSIS — I440 Atrioventricular block, first degree: Secondary | ICD-10-CM | POA: Diagnosis not present

## 2021-01-05 HISTORY — PX: CARDIOVERSION: SHX1299

## 2021-01-05 LAB — CBC
HCT: 41.5 % (ref 36.0–46.0)
Hemoglobin: 13.4 g/dL (ref 12.0–15.0)
MCH: 31.7 pg (ref 26.0–34.0)
MCHC: 32.3 g/dL (ref 30.0–36.0)
MCV: 98.1 fL (ref 80.0–100.0)
Platelets: 176 10*3/uL (ref 150–400)
RBC: 4.23 MIL/uL (ref 3.87–5.11)
RDW: 14.9 % (ref 11.5–15.5)
WBC: 4.8 10*3/uL (ref 4.0–10.5)
nRBC: 0 % (ref 0.0–0.2)

## 2021-01-05 LAB — BASIC METABOLIC PANEL
Anion gap: 8 (ref 5–15)
BUN: 23 mg/dL (ref 8–23)
CO2: 25 mmol/L (ref 22–32)
Calcium: 9.7 mg/dL (ref 8.9–10.3)
Chloride: 105 mmol/L (ref 98–111)
Creatinine, Ser: 1.01 mg/dL — ABNORMAL HIGH (ref 0.44–1.00)
GFR, Estimated: 54 mL/min — ABNORMAL LOW (ref >60)
Glucose, Bld: 98 mg/dL (ref 70–99)
Potassium: 5 mmol/L (ref 3.5–5.1)
Sodium: 138 mmol/L (ref 135–145)

## 2021-01-05 SURGERY — CARDIOVERSION
Anesthesia: General

## 2021-01-05 MED ORDER — SODIUM CHLORIDE 0.9 % IV SOLN: INTRAVENOUS | Status: DC

## 2021-01-05 MED ORDER — PROPOFOL 10 MG/ML IV BOLUS
INTRAVENOUS | Status: DC | PRN
  Administered 2021-01-05: 20 mg via INTRAVENOUS
  Administered 2021-01-05: 40 mg via INTRAVENOUS

## 2021-01-05 MED ORDER — LIDOCAINE 2% (20 MG/ML) 5 ML SYRINGE
INTRAMUSCULAR | Status: DC | PRN
  Administered 2021-01-05: 60 mg via INTRAVENOUS

## 2021-01-05 NOTE — CV Procedure (Signed)
    Cardioversion Note  Alison Rivera [id number] [birth year]  Procedure: DC Cardioversion Indications: Atrial flutter / Atrial fibrillation   Procedure Details Consent: Obtained Time Out: Verified patient identification, verified procedure, site/side was marked, verified correct patient position, special equipment/implants available, Radiology Safety Procedures followed,  medications/allergies/relevent history reviewed, required imaging and test results available.  Performed  The patient has been on adequate anticoagulation.  The patient received IV Lidocaine 60 mg followed by Propofol 70 mg IV  for sedation.  Synchronous cardioversion was performed at 75/120/200  joules.  The cardioversion was successful     Complications: No apparent complications Patient did tolerate procedure well.   Thayer Headings, Brooke Bonito., MD, [facility] 01/05/2021, 10:46 AM

## 2021-01-05 NOTE — Transfer of Care (Signed)
Immediate Anesthesia Transfer of Care Note  Patient: Alison Rivera  Procedure(s) Performed: CARDIOVERSION  Patient Location: PACU and Endoscopy Unit  Anesthesia Type:General  Level of Consciousness: drowsy  Airway & Oxygen Therapy: Patient Spontanous Breathing and Patient connected to nasal cannula oxygen  Post-op Assessment: Report given to RN and Post -op Vital signs reviewed and stable  Post vital signs: Reviewed and stable  Last Vitals:  Vitals Value Taken Time  BP 98/62   Temp    Pulse 58   Resp 15   SpO2 96     Last Pain:  Vitals:   01/05/21 0953  TempSrc: Temporal  PainSc: 0-No pain         Complications: No notable events documented.

## 2021-01-05 NOTE — Interval H&P Note (Signed)
History and Physical Interval Note:  01/05/2021 10:31 AM  [name]  has presented today for surgery, with the diagnosis of AFIB.  The various methods of treatment have been discussed with the patient and family. After consideration of risks, benefits and other options for treatment, the patient has consented to  Procedure(s): CARDIOVERSION (N/A) as a surgical intervention.  The patient's history has been reviewed, patient examined, no change in status, stable for surgery.  I have reviewed the patient's chart and labs.  Questions were answered to the patient's satisfaction.     Alison Rivera

## 2021-01-05 NOTE — Discharge Instructions (Signed)

## 2021-01-05 NOTE — Anesthesia Preprocedure Evaluation (Signed)
Anesthesia Evaluation  Patient identified by MRN, date of birth, ID band Patient awake    Reviewed: Allergy & Precautions, H&P , NPO status , Patient's Chart, lab work & pertinent test results  Airway Mallampati: II   Neck ROM: full    Dental   Pulmonary neg pulmonary ROS,    breath sounds clear to auscultation       Cardiovascular hypertension, + Valvular Problems/Murmurs MVP  Rhythm:irregular Rate:Normal     Neuro/Psych  Headaches, PSYCHIATRIC DISORDERS Anxiety    GI/Hepatic   Endo/Other    Renal/GU      Musculoskeletal   Abdominal   Peds  Hematology   Anesthesia Other Findings   Reproductive/Obstetrics                             Anesthesia Physical Anesthesia Plan  ASA: 3  Anesthesia Plan: General   Post-op Pain Management:    Induction: Intravenous  PONV Risk Score and Plan: 3 and Propofol infusion and Treatment may vary due to age or medical condition  Airway Management Planned: Mask  Additional Equipment:   Intra-op Plan:   Post-operative Plan:   Informed Consent: I have reviewed the patients History and Physical, chart, labs and discussed the procedure including the risks, benefits and alternatives for the proposed anesthesia with the patient or authorized representative who has indicated his/her understanding and acceptance.     Dental advisory given  Plan Discussed with: CRNA, Anesthesiologist and Surgeon  Anesthesia Plan Comments:         Anesthesia Quick Evaluation

## 2021-01-06 NOTE — Anesthesia Postprocedure Evaluation (Signed)
Anesthesia Post Note  Patient: Alison Rivera  Procedure(s) Performed: CARDIOVERSION     Patient location during evaluation: Endoscopy Anesthesia Type: General Level of consciousness: awake and alert Pain management: pain level controlled Vital Signs Assessment: post-procedure vital signs reviewed and stable Respiratory status: spontaneous breathing, nonlabored ventilation, respiratory function stable and patient connected to nasal cannula oxygen Cardiovascular status: blood pressure returned to baseline and stable Postop Assessment: no apparent nausea or vomiting Anesthetic complications: no   No notable events documented.  Last Vitals:  Vitals:   01/05/21 1100 01/05/21 1110  BP: (!) 106/55 (!) 147/77  Pulse: (!) 58 67  Resp: 14 12  Temp:    SpO2: 100% 100%    Last Pain:  Vitals:   01/05/21 1110  TempSrc:   PainSc: 0-No pain   Pain Goal:                   Staci Carver S

## 2021-01-08 ENCOUNTER — Encounter (HOSPITAL_COMMUNITY): Payer: Self-pay | Admitting: Cardiovascular Disease

## 2021-01-17 ENCOUNTER — Other Ambulatory Visit: Payer: Self-pay

## 2021-01-17 ENCOUNTER — Encounter (HOSPITAL_COMMUNITY): Payer: Self-pay | Admitting: Physician Assistant

## 2021-01-17 ENCOUNTER — Ambulatory Visit (HOSPITAL_COMMUNITY)
Admission: RE | Admit: 2021-01-17 | Discharge: 2021-01-17 | Disposition: A | Discharge status: Home / Self Care | Payer: Medicare Other | Source: Ambulatory Visit | Attending: Physician Assistant | Admitting: Physician Assistant

## 2021-01-17 VITALS — BP 142/70 | HR 75 | Ht 64.0 in | Wt 113.2 lb

## 2021-01-17 DIAGNOSIS — F429 Obsessive-compulsive disorder, unspecified: Secondary | ICD-10-CM | POA: Insufficient documentation

## 2021-01-17 DIAGNOSIS — I10 Essential (primary) hypertension: Secondary | ICD-10-CM | POA: Diagnosis not present

## 2021-01-17 DIAGNOSIS — D6869 Other thrombophilia: Secondary | ICD-10-CM

## 2021-01-17 DIAGNOSIS — Z79899 Other long term (current) drug therapy: Secondary | ICD-10-CM | POA: Insufficient documentation

## 2021-01-17 DIAGNOSIS — I341 Nonrheumatic mitral (valve) prolapse: Secondary | ICD-10-CM | POA: Diagnosis not present

## 2021-01-17 DIAGNOSIS — I4510 Unspecified right bundle-branch block: Secondary | ICD-10-CM | POA: Insufficient documentation

## 2021-01-17 DIAGNOSIS — I440 Atrioventricular block, first degree: Secondary | ICD-10-CM | POA: Insufficient documentation

## 2021-01-17 DIAGNOSIS — I484 Atypical atrial flutter: Secondary | ICD-10-CM

## 2021-01-17 DIAGNOSIS — Z7901 Long term (current) use of anticoagulants: Secondary | ICD-10-CM | POA: Diagnosis not present

## 2021-01-17 DIAGNOSIS — I4892 Unspecified atrial flutter: Secondary | ICD-10-CM | POA: Diagnosis present

## 2021-01-17 MED ORDER — METOPROLOL SUCCINATE ER 25 MG PO TB24: 25.0000 mg | ORAL_TABLET | Freq: Two times a day (BID) | ORAL | 3 refills | Status: DC

## 2021-01-17 NOTE — Progress Notes (Signed)
Primary Care Physician: Ginger Organ., MD Primary Cardiologist: Dr Marlou Porch (remotely) Primary Electrophysiologist: none Referring Physician: Reginold Agent NP   Alison Rivera is a 87 y.o. female with a history of HTN, OCD, MVP, atrial fibrillation who presents for follow up in the [facility]. The patient was initially diagnosed with atrial flutter 12/15/20 after presenting to her PCP with symptoms of tachypalpitations and mild SOB which had been ongoing for 2 weeks. Patient check on an [place] which showed rapid heart rates. Her metoprolol was increased and she was started on Eliquis for a CHADS2VASC score of 4. There were no specific triggers that she could identify. She does drink alcohol occasionally.   On follow up today, patient is s/p DCCV on 01/05/21. She is in SR today with resolution of her palpitations and SOB with exertion. She denies any bleeding issues on anticoagulation.   Today, she denies symptoms of palpitations, chest pain, orthopnea, PND, lower extremity edema, dizziness, presyncope, syncope, snoring, daytime somnolence, bleeding, or neurologic sequela. The patient is tolerating medications without difficulties and is otherwise without complaint today.    Atrial Fibrillation Risk Factors:  she does not have symptoms or diagnosis of sleep apnea. she does not have a history of rheumatic fever. she does have a history of alcohol use. The patient does not have a history of early familial atrial fibrillation or other arrhythmias.  she has a BMI of Body mass index is 19.43 kg/m.Marland Kitchen Filed Weights   01/17/21 1445  Weight: 51.3 kg    Family History  Problem Relation Age of Onset   Other Mother        AD   Congestive Heart Failure Father    Syncope episode Father    Parkinson's disease Brother    Other Brother        Deep Brain Stimulator     Atrial Fibrillation Management history:  Previous antiarrhythmic drugs:  none Previous cardioversions: 01/05/21 Previous ablations: none CHADS2VASC score: 4 Anticoagulation history: Eliquis   Past Medical History:  Diagnosis Date   Chest pain, atypical    Lichen planus    Mitral valve prolapse    Osteoporosis    Squamous cell skin cancer    Past Surgical History:  Procedure Laterality Date   CARDIOVERSION N/A 01/05/2021   Procedure: CARDIOVERSION;  Surgeon: Nahser, Wonda Cheng, MD;  Location: [facility];  Service: Cardiovascular;  Laterality: N/A;   im nail right hip Right 01/04/2020   INTRAMEDULLARY (IM) NAIL INTERTROCHANTERIC Right 01/04/2020   Procedure: INTRAMEDULLARY (IM) NAIL INTERTROCHANTRIC;  Surgeon: Leandrew Koyanagi, MD;  Location: [facility];  Service: Orthopedics;  Laterality: Right;   NOSE SURGERY      Current Outpatient Medications  Medication Sig Dispense Refill   alendronate (FOSAMAX) 70 MG tablet Take 70 mg by mouth every Monday.     apixaban (ELIQUIS) 2.5 MG TABS tablet Take 1 tablet (2.5 mg total) by mouth 2 (two) times daily. 60 tablet 3   cholecalciferol (VITAMIN D) 25 MCG tablet Take 1 tablet (1,000 Units total) by mouth daily. 30 tablet 0   hydrocortisone 2.5 % cream Apply 1 application topically 2 (two) times daily as needed (irritation).     Multiple Vitamins-Minerals (CENTRUM SILVER) tablet Take 1 tablet by mouth in the morning.     sertraline (ZOLOFT) 50 MG tablet Take 1 tablet (50 mg total) by mouth daily. 30 tablet 0   zolpidem (AMBIEN) 10 MG tablet Take 5 mg by mouth  at bedtime.     metoprolol succinate (TOPROL-XL) 25 MG 24 hr tablet Take 1 tablet (25 mg total) by mouth 2 (two) times daily. 60 tablet 3   No current facility-administered medications for this encounter.    No Known Allergies  Social History   Socioeconomic History   Marital status: Married    Spouse name: Not on file   Number of children: Not on file   Years of education: Not on file   Highest education level: Not on file  Occupational History   Not on  file  Tobacco Use   Smoking status: Never   Smokeless tobacco: Never  Vaping Use   Vaping Use: Never used  Substance and Sexual Activity   Alcohol use: Yes    Alcohol/week: 5.0 standard drinks    Types: 5 Glasses of wine per week    Comment: 1-2 glasses of wine 12/19/2020   Drug use: No   Sexual activity: Not on file  Other Topics Concern   Not on file  Social History Narrative   Not on file   Social Determinants of Health   Financial Resource Strain: Not on file  Food Insecurity: Not on file  Transportation Needs: Not on file  Physical Activity: Not on file  Stress: Not on file  Social Connections: Not on file  Intimate Partner Violence: Not on file     ROS- All systems are reviewed and negative except as per the HPI above.  Physical Exam: Vitals:   01/17/21 1445  BP: (!) 142/70  Pulse: 75  Weight: 51.3 kg  Height: 5\' 4"  (1.626 m)    GEN- The patient is a well appearing elderly female, alert and oriented x 3 today.   HEENT-head normocephalic, atraumatic, sclera clear, conjunctiva pink, hearing intact, trachea midline. Lungs- Clear to ausculation bilaterally, normal work of breathing Heart- Regular rate and rhythm, no murmurs, rubs or gallops  GI- soft, NT, ND, + BS Extremities- no clubbing, cyanosis, or edema MS- no significant deformity or atrophy Skin- no rash or lesion Psych- euthymic mood, full affect Neuro- strength and sensation are intact   Wt Readings from Last 3 Encounters:  01/17/21 51.3 kg  01/05/21 54.4 kg  12/19/20 54.4 kg    EKG today demonstrates  SR, 1st degree AV block Vent. rate 75 BPM PR interval 254 ms QRS duration 92 ms QT/QTcB 398/444 ms  Echo 04/15/13 demonstrated  - Left ventricle: The cavity size was normal. Wall thickness    was normal. Systolic function was normal. The estimated    ejection fraction was in the range of 55% to 60%.  - Aortic valve: Mild regurgitation.  - Mitral valve: Mild anterior leaflet prolapse Mild     regurgitation.  - Left atrium: The atrium was mildly dilated.  - Atrial septum: No defect or patent foramen ovale was    identified.  - Pulmonary arteries: PA peak pressure: 66mm Hg (S).   Epic records are reviewed at length today  CHA2DS2-VASc Score = 4  The patient's score is based upon: CHF History: 0 HTN History: 1 Diabetes History: 0 Stroke History: 0 Vascular Disease History: 0 Age Score: 2 Gender Score: 1      ASSESSMENT AND PLAN: 1. Atrial flutter The patient's CHA2DS2-VASc score is 4, indicating a 4.8% annual risk of stroke.   S/p DCCV on 01/05/21 Patient back in SR. Continue Eliquis 2.5 mg BID (age >40, weight < 60kg) Continue Toprol 25 mg BID  2. Secondary Hypercoagulable State (ICD10:  D68.69) The patient is at significant risk for stroke/thromboembolism based upon her CHA2DS2-VASc Score of 4.  Continue Apixaban (Eliquis).   3. HTN Stable, no changes today.  4. MVP Mild MR on echo 2015. Echo scheduled.    Follow up in the AF clinic in 3 months.    [facility] [address] [place], [place] [address] [phone] 01/17/2021 4:04 PM

## 2021-01-24 ENCOUNTER — Other Ambulatory Visit: Payer: Self-pay

## 2021-01-24 ENCOUNTER — Ambulatory Visit (HOSPITAL_COMMUNITY)
Admission: RE | Admit: 2021-01-24 | Discharge: 2021-01-24 | Disposition: A | Discharge status: Home / Self Care | Payer: Medicare Other | Source: Ambulatory Visit | Attending: Physician Assistant | Admitting: Physician Assistant

## 2021-01-24 DIAGNOSIS — I083 Combined rheumatic disorders of mitral, aortic and tricuspid valves: Secondary | ICD-10-CM | POA: Diagnosis not present

## 2021-01-24 DIAGNOSIS — I4891 Unspecified atrial fibrillation: Secondary | ICD-10-CM | POA: Diagnosis not present

## 2021-01-24 DIAGNOSIS — I3139 Other pericardial effusion (noninflammatory): Secondary | ICD-10-CM | POA: Insufficient documentation

## 2021-01-24 DIAGNOSIS — I484 Atypical atrial flutter: Secondary | ICD-10-CM | POA: Diagnosis not present

## 2021-01-24 DIAGNOSIS — I119 Hypertensive heart disease without heart failure: Secondary | ICD-10-CM | POA: Diagnosis not present

## 2021-01-24 LAB — ECHOCARDIOGRAM COMPLETE
Area-P 1/2: 3.68 cm2
MV M vel: 4.14 m/s
MV Peak grad: 68.6 mmHg
P 1/2 time: 464 msec
S' Lateral: 2.7 cm

## 2021-01-24 NOTE — Progress Notes (Signed)
°  Echocardiogram 2D Echocardiogram has been performed.  Alison Rivera 01/24/2021, 4:03 PM

## 2021-04-15 ENCOUNTER — Other Ambulatory Visit (HOSPITAL_COMMUNITY): Payer: Self-pay | Admitting: Physician Assistant

## 2021-04-18 ENCOUNTER — Encounter (HOSPITAL_COMMUNITY): Payer: Self-pay | Admitting: Physician Assistant

## 2021-04-18 ENCOUNTER — Ambulatory Visit (HOSPITAL_COMMUNITY)
Admission: RE | Admit: 2021-04-18 | Discharge: 2021-04-18 | Disposition: A | Discharge status: Home / Self Care | Payer: Medicare Other | Source: Ambulatory Visit | Attending: Physician Assistant | Admitting: Physician Assistant

## 2021-04-18 ENCOUNTER — Other Ambulatory Visit: Payer: Self-pay

## 2021-04-18 VITALS — BP 118/68 | HR 59 | Ht 64.0 in | Wt 115.6 lb

## 2021-04-18 DIAGNOSIS — I341 Nonrheumatic mitral (valve) prolapse: Secondary | ICD-10-CM | POA: Insufficient documentation

## 2021-04-18 DIAGNOSIS — R001 Bradycardia, unspecified: Secondary | ICD-10-CM | POA: Diagnosis not present

## 2021-04-18 DIAGNOSIS — I4892 Unspecified atrial flutter: Secondary | ICD-10-CM | POA: Insufficient documentation

## 2021-04-18 DIAGNOSIS — I484 Atypical atrial flutter: Secondary | ICD-10-CM | POA: Diagnosis not present

## 2021-04-18 DIAGNOSIS — F429 Obsessive-compulsive disorder, unspecified: Secondary | ICD-10-CM | POA: Insufficient documentation

## 2021-04-18 DIAGNOSIS — R9431 Abnormal electrocardiogram [ECG] [EKG]: Secondary | ICD-10-CM | POA: Diagnosis not present

## 2021-04-18 DIAGNOSIS — Z8249 Family history of ischemic heart disease and other diseases of the circulatory system: Secondary | ICD-10-CM | POA: Insufficient documentation

## 2021-04-18 DIAGNOSIS — I440 Atrioventricular block, first degree: Secondary | ICD-10-CM | POA: Diagnosis not present

## 2021-04-18 DIAGNOSIS — Z79899 Other long term (current) drug therapy: Secondary | ICD-10-CM | POA: Insufficient documentation

## 2021-04-18 DIAGNOSIS — Z7901 Long term (current) use of anticoagulants: Secondary | ICD-10-CM | POA: Diagnosis not present

## 2021-04-18 DIAGNOSIS — D6869 Other thrombophilia: Secondary | ICD-10-CM | POA: Insufficient documentation

## 2021-04-18 DIAGNOSIS — I10 Essential (primary) hypertension: Secondary | ICD-10-CM | POA: Diagnosis not present

## 2021-04-18 DIAGNOSIS — I340 Nonrheumatic mitral (valve) insufficiency: Secondary | ICD-10-CM | POA: Diagnosis not present

## 2021-04-18 NOTE — Progress Notes (Signed)
? ? ?Primary Care Physician: Ginger Organ., MD ?Primary Cardiologist: Dr Marlou Porch (remotely) ?Primary Electrophysiologist: none ?Referring Physician: Reginold Agent NP ? ? ?Alison Rivera is a 88 y.o. female with a history of HTN, OCD, MVP, atrial fibrillation who presents for follow up in the [facility]. The patient was initially diagnosed with atrial flutter 12/15/20 after presenting to her PCP with symptoms of tachypalpitations and mild SOB which had been ongoing for 2 weeks. Patient check on an [place] which showed rapid heart rates. Her metoprolol was increased and she was started on Eliquis for a CHADS2VASC score of 4. There were no specific triggers that she could identify. She does drink alcohol occasionally. Patient is s/p DCCV on 01/05/21.  ? ?On follow up today, patient reports that she has done well since her last visit. She has had very brief palpitations when she lays down at night but these resolve within seconds. No bleeding issues on anticoagulation.   ? ?Today, she denies symptoms of chest pain, orthopnea, PND, lower extremity edema, dizziness, presyncope, syncope, snoring, daytime somnolence, bleeding, or neurologic sequela. The patient is tolerating medications without difficulties and is otherwise without complaint today.  ? ? ?Atrial Fibrillation Risk Factors: ? ?she does not have symptoms or diagnosis of sleep apnea. ?she does not have a history of rheumatic fever. ?she does have a history of alcohol use. ?The patient does not have a history of early familial atrial fibrillation or other arrhythmias. ? ?she has a BMI of Body mass index is 19.84 kg/m?Marland KitchenMarland Kitchen ?Filed Weights  ? 04/18/21 1441  ?Weight: 52.4 kg  ? ? ? ?Family History  ?Problem Relation Age of Onset  ? Other Mother   ?     AD  ? Congestive Heart Failure Father   ? Syncope episode Father   ? Parkinson's disease Brother   ? Other Brother   ?     Deep Brain Stimulator  ? ? ? ?Atrial Fibrillation  Management history: ? ?Previous antiarrhythmic drugs: none ?Previous cardioversions: 01/05/21 ?Previous ablations: none ?CHADS2VASC score: 4 ?Anticoagulation history: Eliquis ? ? ?Past Medical History:  ?Diagnosis Date  ? Chest pain, atypical   ? Lichen planus   ? Mitral valve prolapse   ? Osteoporosis   ? Squamous cell skin cancer   ? ?Past Surgical History:  ?Procedure Laterality Date  ? CARDIOVERSION N/A 01/05/2021  ? Procedure: CARDIOVERSION;  Surgeon: Thayer Headings, MD;  Location: [facility];  Service: Cardiovascular;  Laterality: N/A;  ? im nail right hip Right 01/04/2020  ? INTRAMEDULLARY (IM) NAIL INTERTROCHANTERIC Right 01/04/2020  ? Procedure: INTRAMEDULLARY (IM) NAIL INTERTROCHANTRIC;  Surgeon: Leandrew Koyanagi, MD;  Location: [facility];  Service: Orthopedics;  Laterality: Right;  ? NOSE SURGERY    ? ? ?Current Outpatient Medications  ?Medication Sig Dispense Refill  ? alendronate (FOSAMAX) 70 MG tablet Take 70 mg by mouth every Monday.    ? apixaban (ELIQUIS) 2.5 MG TABS tablet Take 1 tablet (2.5 mg total) by mouth 2 (two) times daily. 60 tablet 3  ? cholecalciferol (VITAMIN D) 25 MCG tablet Take 1 tablet (1,000 Units total) by mouth daily. 30 tablet 0  ? hydrocortisone 2.5 % cream Apply 1 application topically 2 (two) times daily as needed (irritation).    ? metoprolol succinate (TOPROL-XL) 25 MG 24 hr tablet TAKE 1 TABLET BY MOUTH TWICE A DAY 180 tablet 1  ? Multiple Vitamins-Minerals (CENTRUM SILVER) tablet Take 1 tablet by mouth in the  morning.    ? sertraline (ZOLOFT) 50 MG tablet Take 1 tablet (50 mg total) by mouth daily. 30 tablet 0  ? zolpidem (AMBIEN) 10 MG tablet Take 5 mg by mouth at bedtime.    ? ?No current facility-administered medications for this encounter.  ? ? ?No Known Allergies ? ?Social History  ? ?Socioeconomic History  ? Marital status: Married  ?  Spouse name: Not on file  ? Number of children: Not on file  ? Years of education: Not on file  ? Highest education level: Not on file   ?Occupational History  ? Not on file  ?Tobacco Use  ? Smoking status: Never  ? Smokeless tobacco: Never  ?Vaping Use  ? Vaping Use: Never used  ?Substance and Sexual Activity  ? Alcohol use: Yes  ?  Alcohol/week: 5.0 standard drinks  ?  Types: 5 Glasses of wine per week  ?  Comment: 1-2 glasses of wine 12/19/2020  ? Drug use: No  ? Sexual activity: Not on file  ?Other Topics Concern  ? Not on file  ?Social History Narrative  ? Not on file  ? ?Social Determinants of Health  ? ?Financial Resource Strain: Not on file  ?Food Insecurity: Not on file  ?Transportation Needs: Not on file  ?Physical Activity: Not on file  ?Stress: Not on file  ?Social Connections: Not on file  ?Intimate Partner Violence: Not on file  ? ? ? ?ROS- All systems are reviewed and negative except as per the HPI above. ? ?Physical Exam: ?Vitals:  ? 04/18/21 1441  ?BP: 118/68  ?Pulse: (!) 59  ?Weight: 52.4 kg  ?Height: '5\' 4"'$  (1.626 m)  ? ? ?GEN- The patient is a well appearing elderly female, alert and oriented x 3 today.   ?HEENT-head normocephalic, atraumatic, sclera clear, conjunctiva pink, hearing intact, trachea midline. ?Lungs- Clear to ausculation bilaterally, normal work of breathing ?Heart- Regular rate and rhythm, no murmurs, rubs or gallops  ?GI- soft, NT, ND, + BS ?Extremities- no clubbing, cyanosis, or edema ?MS- no significant deformity or atrophy ?Skin- no rash or lesion ?Psych- euthymic mood, full affect ?Neuro- strength and sensation are intact ? ? ?Wt Readings from Last 3 Encounters:  ?04/18/21 52.4 kg  ?01/17/21 51.3 kg  ?01/05/21 54.4 kg  ? ? ?EKG today demonstrates  ?SB, 1st degree AV block ?Vent. rate 59 BPM ?PR interval 252 ms ?QRS duration 86 ms ?QT/QTcB 414/409 ms ? ?Echo 01/24/21 demonstrated  ? 1. Left ventricular ejection fraction, by estimation, is 55 to 60%. The  ?left ventricle has normal function. The left ventricle has no regional  ?wall motion abnormalities. There is mild concentric left ventricular  ?hypertrophy.  Left ventricular diastolic parameters are indeterminate. Elevated left ventricular end-diastolic pressure.  ? 2. Right ventricular systolic function is normal. The right ventricular  ?size is normal. There is normal pulmonary artery systolic pressure.  ? 3. Left atrial size was mildly dilated.  ? 4. The pericardial effusion is posterior to the left ventricle.  ? 5. The mitral valve is abnormal. Mild to moderate mitral valve  ?regurgitation.  ? 6. The aortic valve is tricuspid. There is mild calcification of the  ?aortic valve. There is mild thickening of the aortic valve. Aortic valve  ?regurgitation is mild to moderate. Aortic valve sclerosis/calcification is  ?present, without any evidence of  ?aortic stenosis.  ? 7. The inferior vena cava is normal in size with greater than 50%  ?respiratory variability, suggesting right atrial pressure of  3 mmHg. ? ?Epic records are reviewed at length today ? ?CHA2DS2-VASc Score = 4  ?The patient's score is based upon: ?CHF History: 0 ?HTN History: 1 ?Diabetes History: 0 ?Stroke History: 0 ?Vascular Disease History: 0 ?Age Score: 2 ?Gender Score: 1 ?    ? ? ?ASSESSMENT AND PLAN: ?1. Atrial flutter ?The patient's CHA2DS2-VASc score is 4, indicating a 4.8% annual risk of stroke.   ?Patient appears to be maintaining SR. ?Continue Eliquis 2.5 mg BID (age >69, weight < 60kg) ?Continue Toprol 25 mg BID ? ?2. Secondary Hypercoagulable State (ICD10:  D68.69) ?The patient is at significant risk for stroke/thromboembolism based upon her CHA2DS2-VASc Score of 4.  Continue Apixaban (Eliquis).  ? ?3. HTN ?Stable, no changes today. ? ?4. MR ?Mild-moderate MR, no clear prolapse. ? ? ?Follow up in the AF clinic in one year.  ? ? ?Ricky Alfhild Partch PA-C ?[facility] ?[facility] ?[address] ?[place], [place] [address] ?[phone] ?04/18/2021 ?2:47 PM ? ?

## 2021-05-29 ENCOUNTER — Other Ambulatory Visit (HOSPITAL_COMMUNITY): Payer: Self-pay | Admitting: *Deleted

## 2021-05-29 MED ORDER — APIXABAN 2.5 MG PO TABS: 2.5000 mg | ORAL_TABLET | Freq: Two times a day (BID) | ORAL | 11 refills | Status: DC

## 2021-10-03 ENCOUNTER — Other Ambulatory Visit (HOSPITAL_COMMUNITY): Payer: Self-pay

## 2021-10-03 MED ORDER — METOPROLOL SUCCINATE ER 25 MG PO TB24: 25.0000 mg | ORAL_TABLET | Freq: Two times a day (BID) | ORAL | 1 refills | Status: DC

## 2021-10-06 ENCOUNTER — Other Ambulatory Visit (HOSPITAL_COMMUNITY): Payer: Self-pay | Admitting: Physician Assistant

## 2021-10-08 ENCOUNTER — Emergency Department (HOSPITAL_BASED_OUTPATIENT_CLINIC_OR_DEPARTMENT_OTHER): Payer: Medicare Other

## 2021-10-08 ENCOUNTER — Emergency Department (HOSPITAL_BASED_OUTPATIENT_CLINIC_OR_DEPARTMENT_OTHER): Payer: Medicare Other | Admitting: Radiology

## 2021-10-08 ENCOUNTER — Other Ambulatory Visit: Payer: Self-pay

## 2021-10-08 ENCOUNTER — Emergency Department (HOSPITAL_BASED_OUTPATIENT_CLINIC_OR_DEPARTMENT_OTHER)
Admission: EM | Admit: 2021-10-08 | Discharge: 2021-10-08 | Disposition: A | Discharge status: Home / Self Care | Payer: Medicare Other | Attending: Emergency Medicine | Admitting: Emergency Medicine

## 2021-10-08 ENCOUNTER — Encounter (HOSPITAL_BASED_OUTPATIENT_CLINIC_OR_DEPARTMENT_OTHER): Payer: Self-pay | Admitting: Emergency Medicine

## 2021-10-08 DIAGNOSIS — R339 Retention of urine, unspecified: Secondary | ICD-10-CM | POA: Diagnosis not present

## 2021-10-08 DIAGNOSIS — S32501A Unspecified fracture of right pubis, initial encounter for closed fracture: Secondary | ICD-10-CM | POA: Insufficient documentation

## 2021-10-08 DIAGNOSIS — S32511A Fracture of superior rim of right pubis, initial encounter for closed fracture: Secondary | ICD-10-CM

## 2021-10-08 DIAGNOSIS — Z7901 Long term (current) use of anticoagulants: Secondary | ICD-10-CM | POA: Insufficient documentation

## 2021-10-08 DIAGNOSIS — W010XXA Fall on same level from slipping, tripping and stumbling without subsequent striking against object, initial encounter: Secondary | ICD-10-CM | POA: Insufficient documentation

## 2021-10-08 DIAGNOSIS — S79911A Unspecified injury of right hip, initial encounter: Secondary | ICD-10-CM | POA: Diagnosis present

## 2021-10-08 DIAGNOSIS — Z79899 Other long term (current) drug therapy: Secondary | ICD-10-CM | POA: Insufficient documentation

## 2021-10-08 LAB — URINALYSIS, ROUTINE W REFLEX MICROSCOPIC
Bilirubin Urine: NEGATIVE
Glucose, UA: NEGATIVE mg/dL
Hgb urine dipstick: NEGATIVE
Ketones, ur: NEGATIVE mg/dL
Leukocytes,Ua: NEGATIVE
Nitrite: NEGATIVE
Protein, ur: NEGATIVE mg/dL
Specific Gravity, Urine: 1.007 (ref 1.005–1.030)
pH: 6.5 (ref 5.0–8.0)

## 2021-10-08 MED ORDER — ONDANSETRON 8 MG PO TBDP: 8.0000 mg | ORAL_TABLET | Freq: Three times a day (TID) | ORAL | 0 refills | Status: DC | PRN

## 2021-10-08 MED ORDER — OXYCODONE HCL 5 MG PO TABS: 5.0000 mg | ORAL_TABLET | ORAL | 0 refills | Status: DC | PRN

## 2021-10-08 MED ORDER — ONDANSETRON HCL 4 MG/2ML IJ SOLN: 4.0000 mg | Freq: Once | INTRAMUSCULAR | Status: DC

## 2021-10-08 NOTE — ED Notes (Signed)
Post void bladder scan reading 125 ml.

## 2021-10-08 NOTE — ED Provider Notes (Signed)
[facility] EMERGENCY DEPT Provider Note   CSN: [id number] Arrival date & time: 10/08/21  1125     History  Chief Complaint  Patient presents with   Alison Rivera is a 88 y.o. female.   Fall     Patient presents due to fall.  This happened yesterday, patient tripped over the rug.  She landed on her right hip which she previously fractured and is having pain there since.  She reports decreased urinary stream since the fall but denies any specific loss of bladder or bowel function, complete urinary retention, saddle anesthesia or bilateral lower extremity weakness or numbness.  Patient is on Eliquis, last dose this morning.  She denies hitting her head, losing consciousness.  Fall was witnessed by patient's husband who affirms patient did not hit her head.  She denies chest pain, dizziness, abdominal pain, nausea, vomiting, vision changes, neck pain, upper extremity pain, lower left extremity pain.  No recent changes in medication, no prodromal symptoms.  Home Medications Prior to Admission medications   Medication Sig Start Date End Date Taking? Authorizing Provider  ondansetron (ZOFRAN-ODT) 8 MG disintegrating tablet Take 1 tablet (8 mg total) by mouth every 8 (eight) hours as needed for nausea or vomiting. 10/08/21  Yes Sherrill Raring, PA-C  oxyCODONE (ROXICODONE) 5 MG immediate release tablet Take 1 tablet (5 mg total) by mouth every 4 (four) hours as needed for severe pain. 10/08/21  Yes Sherrill Raring, PA-C  alendronate (FOSAMAX) 70 MG tablet Take 70 mg by mouth every Monday. 12/13/20   [provider]  cholecalciferol (VITAMIN D) 25 MCG tablet Take 1 tablet (1,000 Units total) by mouth daily. 01/20/20   Angiulli, Lavon Paganini, PA-C  ELIQUIS 2.5 MG TABS tablet TAKE 1 TABLET BY MOUTH TWICE A DAY 10/06/21   Fenton, Clint R, PA  hydrocortisone 2.5 % cream Apply 1 application topically 2 (two) times daily as needed (irritation). 09/06/20   [provider]   metoprolol succinate (TOPROL-XL) 25 MG 24 hr tablet Take 1 tablet (25 mg total) by mouth 2 (two) times daily. 10/03/21   Fenton, Clint R, PA  Multiple Vitamins-Minerals (CENTRUM SILVER) tablet Take 1 tablet by mouth in the morning.    [provider]  sertraline (ZOLOFT) 50 MG tablet Take 1 tablet (50 mg total) by mouth daily. 01/20/20   Angiulli, Lavon Paganini, PA-C  zolpidem (AMBIEN) 10 MG tablet Take 5 mg by mouth at bedtime. 10/31/20   [provider]      Allergies    Patient has no known allergies.    Review of Systems   Review of Systems  Physical Exam Updated Vital Signs BP 116/60 (BP Location: Right Arm)   Pulse 75   Temp 98.1 F (36.7 C) (Oral)   Resp 14   SpO2 97%  Physical Exam Vitals and nursing note reviewed. Exam conducted with a chaperone present.  Constitutional:      Appearance: Normal appearance.  HENT:     Head: Normocephalic and atraumatic.  Eyes:     General: No scleral icterus.       Right eye: No discharge.        Left eye: No discharge.     Extraocular Movements: Extraocular movements intact.     Pupils: Pupils are equal, round, and reactive to light.  Cardiovascular:     Rate and Rhythm: Normal rate and regular rhythm.     Pulses: Normal pulses.     Heart sounds:  Normal heart sounds. No murmur heard.    No friction rub. No gallop.  Pulmonary:     Effort: Pulmonary effort is normal. No respiratory distress.     Breath sounds: Normal breath sounds.  Abdominal:     General: Abdomen is flat. Bowel sounds are normal. There is no distension.     Palpations: Abdomen is soft.     Tenderness: There is no abdominal tenderness.  Musculoskeletal:        General: Tenderness present.     Cervical back: Normal range of motion. No tenderness.     Comments: Tenderness to palpation of right hip.  Decreased ROM secondary to pain, is able to flex and extend roughly 40 degrees, pain is worse with extension.  Moves upper extremities out difficulty, left  lower extremity without difficulty.  Full ROM to right knee and ankle without deficits.  No midline thoracic or lumbar tenderness.  Skin:    General: Skin is warm and dry.     Capillary Refill: Capillary refill takes less than 2 seconds.     Coloration: Skin is not jaundiced.  Neurological:     Mental Status: She is alert. Mental status is at baseline.     Coordination: Coordination normal.     ED Results / Procedures / Treatments   Labs (all labs ordered are listed, but only abnormal results are displayed) Labs Reviewed  URINALYSIS, ROUTINE W REFLEX MICROSCOPIC    EKG None  Radiology CT Hip Right Wo Contrast  Result Date: 10/08/2021 CLINICAL DATA:  Hip trauma, fracture suspected, xray done EXAM: CT OF THE RIGHT HIP WITHOUT CONTRAST TECHNIQUE: Multidetector CT imaging of the right hip was performed according to the standard protocol. Multiplanar CT image reconstructions were also generated. RADIATION DOSE REDUCTION: This exam was performed according to the departmental dose-optimization program which includes automated exposure control, adjustment of the mA and/or kV according to patient size and/or use of iterative reconstruction technique. COMPARISON:  Same day radiograph FINDINGS: Bones/Joint/Cartilage Acute, minimally displaced fracture of the right pubic body near the pubic symphysis extending into the superior ramus. Chronically thinned appearance of the inferior pubic rami bilaterally. Postsurgical changes of prior right proximal femur fixation without evidence of hardware complication. There is moderate right hip osteoarthritis. No acute acetabular fracture or periprosthetic right proximal femur fracture. Ligaments Suboptimally assessed by CT. Muscles and Tendons No acute myotendinous abnormality by CT. Soft tissues No focal fluid collection. IMPRESSION: Acute, minimally displaced fracture of the right pubic body near the pubic symphysis extending into the superior pubic ramus. Prior  right proximal femur fixation without evidence of hardware complication. Electronically Signed   By: Maurine Simmering M.D.   On: 10/08/2021 14:15   DG Hip Unilat W or Wo Pelvis 2-3 Views Right  Result Date: 10/08/2021 CLINICAL DATA:  88 year old female status post fall yesterday with right hip pain. EXAM: DG HIP (WITH OR WITHOUT PELVIS) 2-3V RIGHT COMPARISON:  Pelvis radiograph 07/19/2020 and earlier. FINDINGS: Femoral heads remain normally located. Grossly stable and intact proximal left femur. Atretic appearance of the inferior pubic rami is unchanged. No acute pelvic fracture identified. SI joints appear stable. Negative visible bowel gas. Chronic right femur ORIF with intramedullary rod and proximal interlocking dynamic hip screws. The distal rod is not included. Visible hardware appears stable and intact. No acute osseous abnormality identified. IMPRESSION: Chronic right femur ORIF with no acute fracture or dislocation identified about the right hip or pelvis. Electronically Signed   By: Herminio Heads.D.  On: 10/08/2021 12:40    Procedures Procedures    Medications Ordered in ED Medications - No data to display  ED Course/ Medical Decision Making/ A&P                           Medical Decision Making Amount and/or Complexity of Data Reviewed Labs: ordered. Radiology: ordered.  Risk Prescription drug management.   Patient presents due to fall.  Differential includes not limited to hip fracture, dislocation, hemarthrosis, cauda equina.  History is better primarily by the patient.  Her husband and daughter also at bedside providing independent histories.  Patient is on Eliquis with no missed doses.    Patient has some tenderness to the right hip, she is neurovascular intact.  No focal deficits, no lower abdominal tenderness.  Bladder scan unremarkable.  I ordered and reviewed plain film of right hip pelvis which was negative.  Proceed with CT for better evaluation which is notable for acute,  minimally displaced fracture of the right pubic body near the pubic symphysis extending into the superior pubic rami.  This seems to be a stable fracture, patient is able to ambulate with the assistance of a walker.  Her pain is well controlled.  Considered admission but patient and would prefer to follow-up with orthopedic outpatient.  Her daughter and husband are able to help take care of her and she is able to ambulate with the assistance of a walker.  I think this is reasonable.  On reevaluation patient is urinating without difficulty, states the decree stream entirely resolved after the CT scan.  I do not think presentation is consistent with cauda equina.  Impression-fracture.pain medicine sent to pharmacy, patient will follow-up with Dr. Erlinda Hong who did her previous hip replacement.  Do not see indication to hold Eliquis at this time.  Return precaution discussed, discharge stable condition.         Final Clinical Impression(s) / ED Diagnoses Final diagnoses:  Closed fracture of superior ramus of right pubis, initial encounter ([place])    Rx / DC Orders ED Discharge Orders          Ordered    oxyCODONE (ROXICODONE) 5 MG immediate release tablet  Every 4 hours PRN        10/08/21 1453    ondansetron (ZOFRAN-ODT) 8 MG disintegrating tablet  Every 8 hours PRN        10/08/21 1453              Sherrill Raring, PA-C 10/08/21 2146    Fredia Sorrow, MD 10/09/21 [phone]

## 2021-10-08 NOTE — ED Triage Notes (Signed)
Pt fell yesterday, tripped on a blanket and fell on her right hip (this was "bad hip", broken 2 years ago.

## 2021-10-08 NOTE — ED Triage Notes (Signed)
Pt also states she seems to have hard time voiding since that fall as well.

## 2021-10-08 NOTE — Discharge Instructions (Addendum)
You have a fracture to the right side of your hip.  Use your walker to get around as needed, he can bear weight as needed.  Take the pain medicine with any Zofran to prevent nausea and vomiting as needed for severe pain.  Follow-up with Dr. Rigoberto Noel this week in office, information above.  Return to ED for new concerning symptoms.  Acute, minimally displaced fracture of the right pubic body near the  pubic symphysis extending into the superior pubic ramus.    Prior right proximal femur fixation without evidence of hardware  complication.

## 2021-10-08 NOTE — ED Triage Notes (Signed)
Pt is on eliquis for afib

## 2021-10-11 ENCOUNTER — Encounter: Payer: Self-pay | Admitting: Orthopaedic Surgery

## 2021-10-11 ENCOUNTER — Ambulatory Visit: Payer: Medicare Other | Admitting: Orthopaedic Surgery

## 2021-10-11 DIAGNOSIS — S32591A Other specified fracture of right pubis, initial encounter for closed fracture: Secondary | ICD-10-CM | POA: Diagnosis not present

## 2021-10-11 NOTE — Progress Notes (Signed)
Office Visit Note   Patient: Alison Rivera           Date of Birth: [birth year]           MRN: [id number] Visit Date: 10/11/2021              Requested by: Ginger Organ., MD [address] [place],  [place] [address] PCP: Ginger Organ., MD   Assessment & Plan: Visit Diagnoses:  1. Closed fracture of ramus of right pubis, initial encounter ([place])     Plan: Impression is right superior pubic ramus fracture.  At this point, we will treat this symptomatically.  She will rest as needed and take Tylenol as needed.  She will let me know if she needs something stronger.  I also discussed that she should likely discuss her urinary symptoms with her primary care provider if she may need to rule out urinary tract infection.  She will follow-up with [place] in 4 weeks for repeat evaluation and AP pelvis x-rays.  Call with concerns or questions.  Follow-Up Instructions: Return in about 4 weeks (around 11/08/2021).   Orders:  No orders of the defined types were placed in this encounter.  No orders of the defined types were placed in this encounter.     Procedures: No procedures performed   Clinical Data: No additional findings.   Subjective: Chief Complaint  Patient presents with   Right Hip - Injury    DOI 10/07/2021    HPI patient is a pleasant 88 year old female who comes in today following an injury to her right hip.  On 10/08/2021 she was in her room when she tripped over her bedspread falling forward.  She had pain to the right hip and was seen at [facility].  X-rays and subsequent CT scan were obtained which showed a right superior pubic ramus fracture.  She is here today for follow-up.  She does note pain more so to the lateral hip and into the groin.  Symptoms only occur with bearing weight to the right lower extremity.  She does not have any pain with sitting.  She has been taking Tylenol which does help.  She does mention that she has had occasional bladder incontinence  as well as an urge but unable to urinate since the injury.  She denies any bowel changes.  No saddle paresthesias.  Review of Systems as detailed in HPI.  All others reviewed and are negative.   Objective: Vital Signs: There were no vitals taken for this visit.  Physical Exam well-developed well-nourished female no acute distress.  Alert and oriented x3.  Ortho Exam right hip exam shows slight tenderness to the greater trochanter.  Mild pain with logroll.  She is neurovascular intact distally.  Specialty Comments:  No specialty comments available.  Imaging: No new imaging   PMFS History: Patient Active Problem List   Diagnosis Date Noted   Atypical atrial flutter ([place]) 12/19/2020   Secondary hypercoagulable state ([place]) 12/19/2020   History of open reduction and internal fixation (ORIF) procedure    Hyponatremia    Labile blood pressure    Hypoalbuminemia due to protein-calorie malnutrition (HCC)    Hyperglycemia    Sleep disturbance    Intertrochanteric fracture of right hip ([place]) 01/06/2020   Closed nondisplaced intertrochanteric fracture of right femur (HCC)    Acute blood loss anemia    Essential hypertension    Postoperative pain    Drug induced constipation  Closed fracture of femur, intertrochanteric, right, initial encounter ([place]) 01/04/2020   Anemia 52/84/1324   Metabolic acidosis 40/11/2723   Coagulation test abnormality 01/04/2020   Anxiety 03/30/2013   Obsessive-compulsive disorders 03/30/2013   Essential hypertension, benign 03/30/2013   Osteoporosis, unspecified 03/30/2013   Chest pain, atypical    Mitral valve prolapse    MIGRAINE HEADACHE 10/29/2006   HYPERTENSION 10/29/2006   ATRIAL ARRHYTHMIAS 10/29/2006   OSTEOPENIA 10/29/2006   INSOMNIA 10/29/2006   URINARY FREQUENCY 10/29/2006   Past Medical History:  Diagnosis Date   Chest pain, atypical    Lichen planus    Mitral valve prolapse    Osteoporosis    Squamous cell skin cancer     Family  History  Problem Relation Age of Onset   Other Mother        AD   Congestive Heart Failure Father    Syncope episode Father    Parkinson's disease Brother    Other Brother        Sports coach    Past Surgical History:  Procedure Laterality Date   CARDIOVERSION N/A 01/05/2021   Procedure: CARDIOVERSION;  Surgeon: Nahser, Wonda Cheng, MD;  [facility]: [facility] ENDOSCOPY;  Service: Cardiovascular;  Laterality: N/A;   im nail right hip Right 01/04/2020   INTRAMEDULLARY (IM) NAIL INTERTROCHANTERIC Right 01/04/2020   Procedure: INTRAMEDULLARY (IM) NAIL INTERTROCHANTRIC;  Surgeon: Leandrew Koyanagi, MD;  Location: [facility];  Service: Orthopedics;  Laterality: Right;   NOSE SURGERY     Social History   Occupational History   Not on file  Tobacco Use   Smoking status: Never   Smokeless tobacco: Never  Vaping Use   Vaping Use: Never used  Substance and Sexual Activity   Alcohol use: Yes    Alcohol/week: 5.0 standard drinks of alcohol    Types: 5 Glasses of wine per week   Drug use: No   Sexual activity: Not on file

## 2021-10-17 ENCOUNTER — Telehealth: Payer: Self-pay | Admitting: Orthopaedic Surgery

## 2021-10-17 NOTE — Telephone Encounter (Signed)
Pt states she need a call from the Dr. Erlinda Hong pt states she is in severe pain and not sure if this is normal. Pt is not asking for an appt but need medical advice. Please call pt at [phone].

## 2021-10-17 NOTE — Telephone Encounter (Signed)
See message.  Thanks.

## 2021-10-18 ENCOUNTER — Other Ambulatory Visit: Payer: Self-pay

## 2021-10-18 ENCOUNTER — Ambulatory Visit
Admission: RE | Admit: 2021-10-18 | Discharge: 2021-10-18 | Disposition: A | Discharge status: Home / Self Care | Payer: Medicare Other | Source: Ambulatory Visit | Attending: Orthopaedic Surgery | Admitting: Orthopaedic Surgery

## 2021-10-18 ENCOUNTER — Other Ambulatory Visit: Payer: Self-pay | Admitting: Physician Assistant

## 2021-10-18 DIAGNOSIS — M545 Low back pain, unspecified: Secondary | ICD-10-CM

## 2021-10-18 MED ORDER — PREDNISONE 5 MG (21) PO TBPK: ORAL_TABLET | ORAL | 0 refills | Status: DC

## 2021-10-18 MED ORDER — OXYCODONE HCL 5 MG PO TABS: 5.0000 mg | ORAL_TABLET | Freq: Four times a day (QID) | ORAL | 0 refills | Status: DC | PRN

## 2021-10-18 NOTE — Progress Notes (Signed)
Need pelvic CT to evaluate pelvic fractures please.  ASAP.  Thanks.

## 2021-10-18 NOTE — Telephone Encounter (Signed)
Spoke to patient.  Sent in oxycodone and a steroid.  Can we also get an urgent mri lumbar spine?

## 2021-10-19 ENCOUNTER — Other Ambulatory Visit: Payer: Self-pay

## 2021-10-19 DIAGNOSIS — S32591A Other specified fracture of right pubis, initial encounter for closed fracture: Secondary | ICD-10-CM

## 2021-10-20 ENCOUNTER — Ambulatory Visit
Admission: RE | Admit: 2021-10-20 | Discharge: 2021-10-20 | Disposition: A | Discharge status: Home / Self Care | Payer: Medicare Other | Source: Ambulatory Visit | Attending: Orthopaedic Surgery | Admitting: Orthopaedic Surgery

## 2021-10-20 DIAGNOSIS — S32591A Other specified fracture of right pubis, initial encounter for closed fracture: Secondary | ICD-10-CM

## 2021-10-26 ENCOUNTER — Encounter: Payer: Self-pay | Admitting: Orthopaedic Surgery

## 2021-10-26 ENCOUNTER — Ambulatory Visit: Payer: Medicare Other | Admitting: Orthopaedic Surgery

## 2021-10-26 DIAGNOSIS — S3282XA Multiple fractures of pelvis without disruption of pelvic ring, initial encounter for closed fracture: Secondary | ICD-10-CM

## 2021-10-26 NOTE — Progress Notes (Signed)
Office Visit Note   Patient: Alison Rivera           Date of Birth: October 12, 1931           MRN: [id number] Visit Date: 10/26/2021              Requested by: Ginger Organ., MD [address] [place],  [place] [address] PCP: Ginger Organ., MD   Assessment & Plan: Visit Diagnoses:  1. Multiple closed fractures of pelvis without disruption of pelvic ring, initial encounter [facility])     Plan: Domonique returns today to review CT pelvis and MRI lumbar spine.  Daughter is with her.  She is ambulating small distances with a rolling walker at home.  Takes Tylenol and oxycodone at night.  Examination of the pelvis is unchanged.  CT scan shows right superior pubic ramus fracture and healed right inferior pubic ramus fracture and nonunion of left inferior pubic ramus fracture.  No acute sacral fracture.  These findings were reviewed with the patient.  Likely has a bad sacral contusion and bony edema.  I explained that these fractures will not need surgical attention.  We will continue symptomatic treatment.  I will order home health PT for her.  We will see her back in 6 weeks for recheck.  Will need pelvis x-rays on return.  Follow-Up Instructions: Return in about 6 weeks (around 12/07/2021).   Orders:  No orders of the defined types were placed in this encounter.  No orders of the defined types were placed in this encounter.     Procedures: No procedures performed   Clinical Data: No additional findings.   Subjective: Chief Complaint  Patient presents with   Lower Back - Follow-up    CT and MRI review    HPI  Review of Systems   Objective: Vital Signs: There were no vitals taken for this visit.  Physical Exam  Ortho Exam  Specialty Comments:  No specialty comments available.  Imaging: No results found.   PMFS History: Patient Active Problem List   Diagnosis Date Noted   Atypical atrial flutter ([place]) 12/19/2020   Secondary hypercoagulable state ([place])  12/19/2020   History of open reduction and internal fixation (ORIF) procedure    Hyponatremia    Labile blood pressure    Hypoalbuminemia due to protein-calorie malnutrition (HCC)    Hyperglycemia    Sleep disturbance    Intertrochanteric fracture of right hip ([place]) 01/06/2020   Closed nondisplaced intertrochanteric fracture of right femur (HCC)    Acute blood loss anemia    Essential hypertension    Postoperative pain    Drug induced constipation    Closed fracture of femur, intertrochanteric, right, initial encounter ([place]) 01/04/2020   Anemia 57/02/7791   Metabolic acidosis 90/30/0923   Coagulation test abnormality 01/04/2020   Anxiety 03/30/2013   Obsessive-compulsive disorders 03/30/2013   Essential hypertension, benign 03/30/2013   Osteoporosis, unspecified 03/30/2013   Chest pain, atypical    Mitral valve prolapse    MIGRAINE HEADACHE 10/29/2006   HYPERTENSION 10/29/2006   ATRIAL ARRHYTHMIAS 10/29/2006   OSTEOPENIA 10/29/2006   INSOMNIA 10/29/2006   URINARY FREQUENCY 10/29/2006   Past Medical History:  Diagnosis Date   Chest pain, atypical    Lichen planus    Mitral valve prolapse    Osteoporosis    Squamous cell skin cancer     Family History  Problem Relation Age of Onset   Other Mother  AD   Congestive Heart Failure Father    Syncope episode Father    Parkinson's disease Brother    Other Brother        Sports coach    Past Surgical History:  Procedure Laterality Date   CARDIOVERSION N/A 01/05/2021   Procedure: CARDIOVERSION;  Surgeon: Nahser, Wonda Cheng, MD;  Location: [facility];  Service: Cardiovascular;  Laterality: N/A;   im nail right hip Right 01/04/2020   INTRAMEDULLARY (IM) NAIL INTERTROCHANTERIC Right 01/04/2020   Procedure: INTRAMEDULLARY (IM) NAIL INTERTROCHANTRIC;  Surgeon: Leandrew Koyanagi, MD;  Location: [facility];  Service: Orthopedics;  Laterality: Right;   NOSE SURGERY     Social History   Occupational History   Not on file   Tobacco Use   Smoking status: Never   Smokeless tobacco: Never  Vaping Use   Vaping Use: Never used  Substance and Sexual Activity   Alcohol use: Yes    Alcohol/week: 5.0 standard drinks of alcohol    Types: 5 Glasses of wine per week   Drug use: No   Sexual activity: Not on file

## 2021-10-31 ENCOUNTER — Telehealth: Payer: Self-pay | Admitting: Orthopaedic Surgery

## 2021-10-31 NOTE — Telephone Encounter (Signed)
Called and [facility] for [employer].

## 2021-10-31 NOTE — Telephone Encounter (Signed)
No restrictions.  WBAT

## 2021-10-31 NOTE — Telephone Encounter (Signed)
katheine [facility]eder [facility]  [phone] called to give verbal orders for [facility].  Also if there rescrtions for patient

## 2021-12-06 ENCOUNTER — Ambulatory Visit: Payer: Medicare Other | Admitting: Orthopaedic Surgery

## 2021-12-06 ENCOUNTER — Ambulatory Visit (INDEPENDENT_AMBULATORY_CARE_PROVIDER_SITE_OTHER): Payer: Medicare Other

## 2021-12-06 DIAGNOSIS — S3282XA Multiple fractures of pelvis without disruption of pelvic ring, initial encounter for closed fracture: Secondary | ICD-10-CM

## 2021-12-06 NOTE — Progress Notes (Signed)
Office Visit Note   Patient: Alison Rivera           Date of Birth: [birth year]           MRN: [id number] Visit Date: 12/06/2021              Requested by: Ginger Organ., MD [address] [place],   [address] PCP: Ginger Organ., MD   Assessment & Plan: Visit Diagnoses:  1. Multiple closed fractures of pelvis without disruption of pelvic ring, initial encounter ([place])     Plan: Impression is 8 weeks status post right superior inferior pubic rami fractures.  Patient is clinically and radiographically improving.  She will continue to treat this symptomatically.  Follow-up in 6 weeks for repeat evaluation and AP pelvis x-rays.  Call with concerns or questions.  Follow-Up Instructions: Return in about 6 weeks (around 01/17/2022).   Orders:  Orders Placed This Encounter  Procedures   XR Pelvis 1-2 Views   No orders of the defined types were placed in this encounter.     Procedures: No procedures performed   Clinical Data: No additional findings.   Subjective: Chief Complaint  Patient presents with   Right Hip - Pain    HPI patient is a pleasant 88 year old female who comes in today approximately 8 weeks status post right superior and inferior pubic rami fractures.  She has been doing well.  The only discomfort she notices is going from a seated to standing position after having been seated for a while.  She is primarily ambulating without assistance.     Objective: Vital Signs: There were no vitals taken for this visit.  Physical Exam well-developed well-nourished female no acute distress.  Alert and oriented x3.  Ortho Exam painless logroll and hip flexion.  She is neurovascular intact distally.  Specialty Comments:  No specialty comments available.  Imaging: XR Pelvis 1-2 Views  Result Date: 12/06/2021 X-rays demonstrate consolidation to the fracture site    PMFS History: Patient Active Problem List   Diagnosis Date Noted   Atypical  atrial flutter ([place]) 12/19/2020   Secondary hypercoagulable state ([place]) 12/19/2020   History of open reduction and internal fixation (ORIF) procedure    Hyponatremia    Labile blood pressure    Hypoalbuminemia due to protein-calorie malnutrition (HCC)    Hyperglycemia    Sleep disturbance    Intertrochanteric fracture of right hip ([place]) 01/06/2020   Closed nondisplaced intertrochanteric fracture of right femur (HCC)    Acute blood loss anemia    Essential hypertension    Postoperative pain    Drug induced constipation    Closed fracture of femur, intertrochanteric, right, initial encounter ([place]) 01/04/2020   Anemia 40/81/4481   Metabolic acidosis 85/63/1497   Coagulation test abnormality 01/04/2020   Anxiety 03/30/2013   Obsessive-compulsive disorders 03/30/2013   Essential hypertension, benign 03/30/2013   Osteoporosis, unspecified 03/30/2013   Chest pain, atypical    Mitral valve prolapse    MIGRAINE HEADACHE 10/29/2006   HYPERTENSION 10/29/2006   ATRIAL ARRHYTHMIAS 10/29/2006   OSTEOPENIA 10/29/2006   INSOMNIA 10/29/2006   URINARY FREQUENCY 10/29/2006   Past Medical History:  Diagnosis Date   Chest pain, atypical    Lichen planus    Mitral valve prolapse    Osteoporosis    Squamous cell skin cancer     Family History  Problem Relation Age of Onset   Other Mother        AD  Congestive Heart Failure Father    Syncope episode Father    Parkinson's disease Brother    Other Brother        Sports coach    Past Surgical History:  Procedure Laterality Date   CARDIOVERSION N/A 01/05/2021   Procedure: CARDIOVERSION;  Surgeon: Nahser, Wonda Cheng, MD;  Location: [facility];  Service: Cardiovascular;  Laterality: N/A;   im nail right hip Right 01/04/2020   INTRAMEDULLARY (IM) NAIL INTERTROCHANTERIC Right 01/04/2020   Procedure: INTRAMEDULLARY (IM) NAIL INTERTROCHANTRIC;  Surgeon: Leandrew Koyanagi, MD;  Location: [facility];  Service: Orthopedics;  Laterality: Right;    NOSE SURGERY     Social History   Occupational History   Not on file  Tobacco Use   Smoking status: Never   Smokeless tobacco: Never  Vaping Use   Vaping Use: Never used  Substance and Sexual Activity   Alcohol use: Yes    Alcohol/week: 5.0 standard drinks of alcohol    Types: 5 Glasses of wine per week   Drug use: No   Sexual activity: Not on file

## 2021-12-07 ENCOUNTER — Ambulatory Visit: Payer: Medicare Other | Admitting: Orthopaedic Surgery

## 2022-01-17 ENCOUNTER — Ambulatory Visit: Payer: Medicare Other | Admitting: Orthopaedic Surgery

## 2022-01-26 IMAGING — RF DG C-ARM 1-60 MIN
1 series · 2 of 2 positions shown · non-contrast
Comparison: Earlier same day.

CLINICAL DATA: Fixation right hip fracture.

EXAM:
OPERATIVE right HIP (WITH PELVIS IF PERFORMED) 2 VIEWS
TECHNIQUE: Fluoroscopic spot image(s) were submitted for interpretation
post-operatively.

[Series 1: run · 2 of 2 slices shown]
[im 1/2]
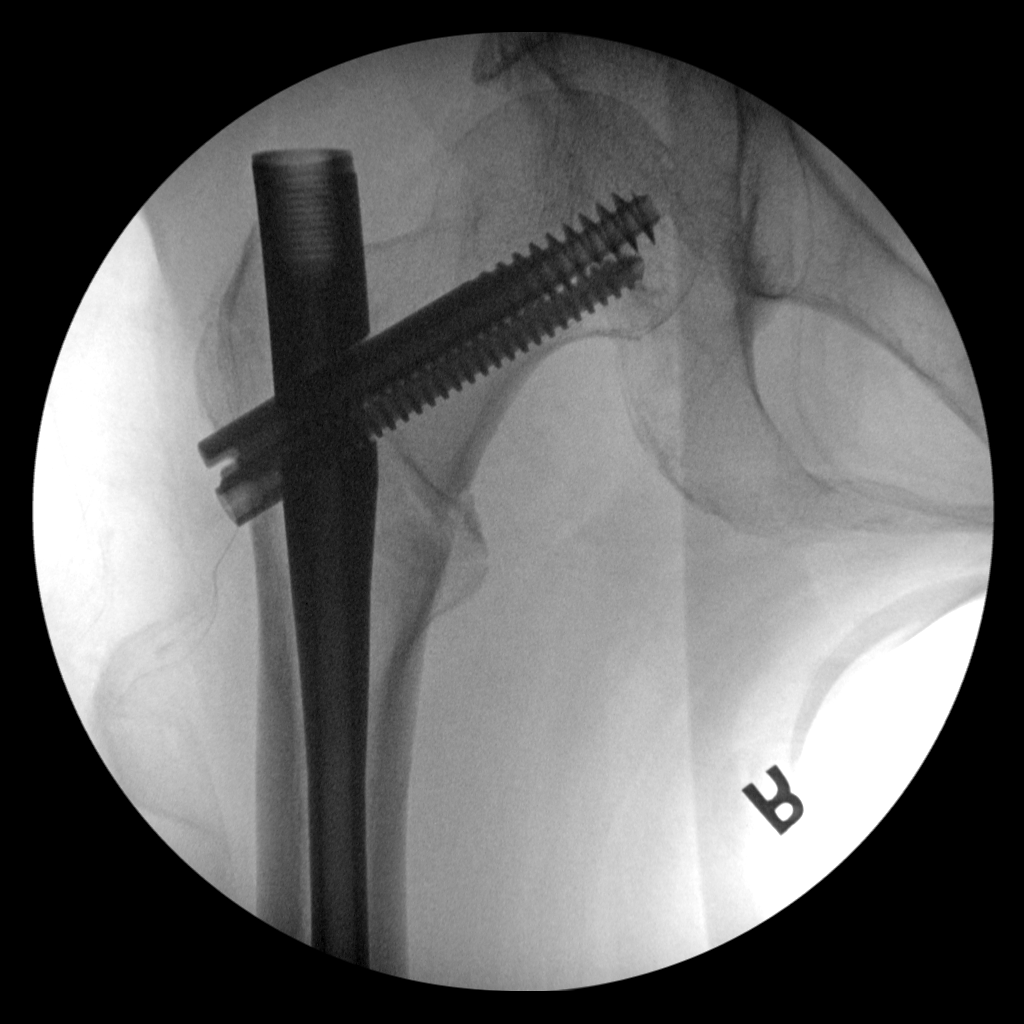
[im 2/2]
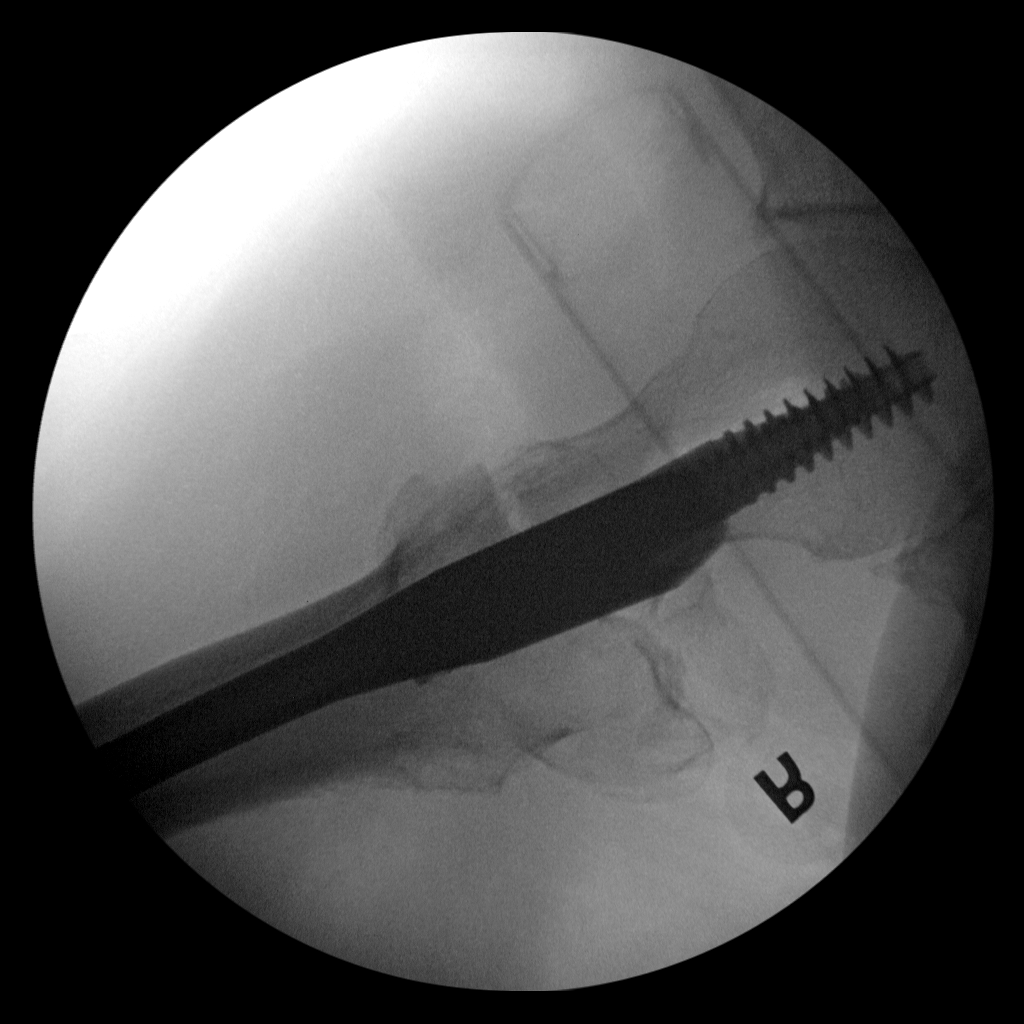

[2 of 2 positions shown; findings below may reference images not displayed]

FINDINGS: Examination demonstrates placement of right femoral intramedullary
nail with 2 associated screws bridging the femoral neck into the
femoral head fixating patient's intertrochanteric fracture. Hardware
is intact with anatomic alignment over the fracture site.
IMPRESSION: Expected changes post fixation of right intertrochanteric hip
fracture.

## 2022-01-26 IMAGING — DX DG PORTABLE PELVIS
1 series · 2 of 2 positions shown · non-contrast
Comparison: Intraoperative radiographs right hip January 04, 2020;
preoperative pelvis and right hip images January 04, 2020

CLINICAL DATA: Intramedullary nail placement for fracture

EXAM:
PORTABLE PELVIS 1-2 VIEWS

[Series 1: pelvis · 0.14mm/px · 2 of 2 slices shown]
[im 1/2]
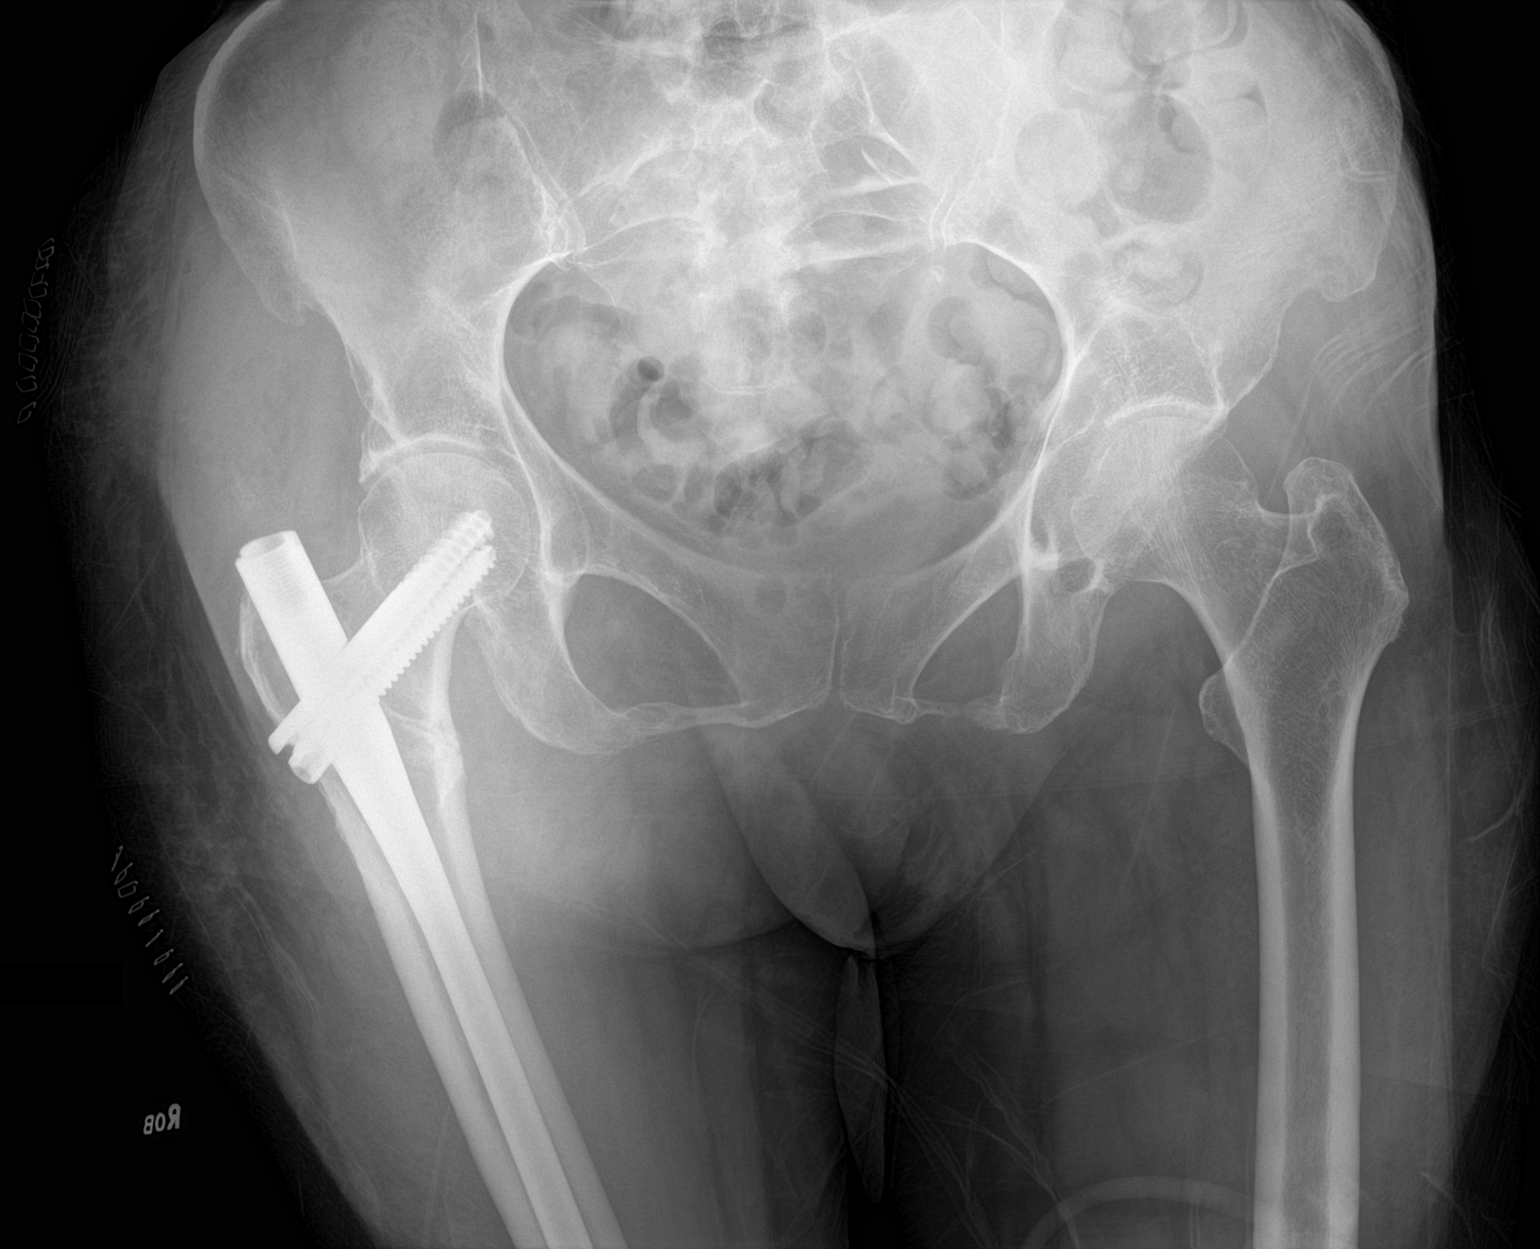
[im 2/2]
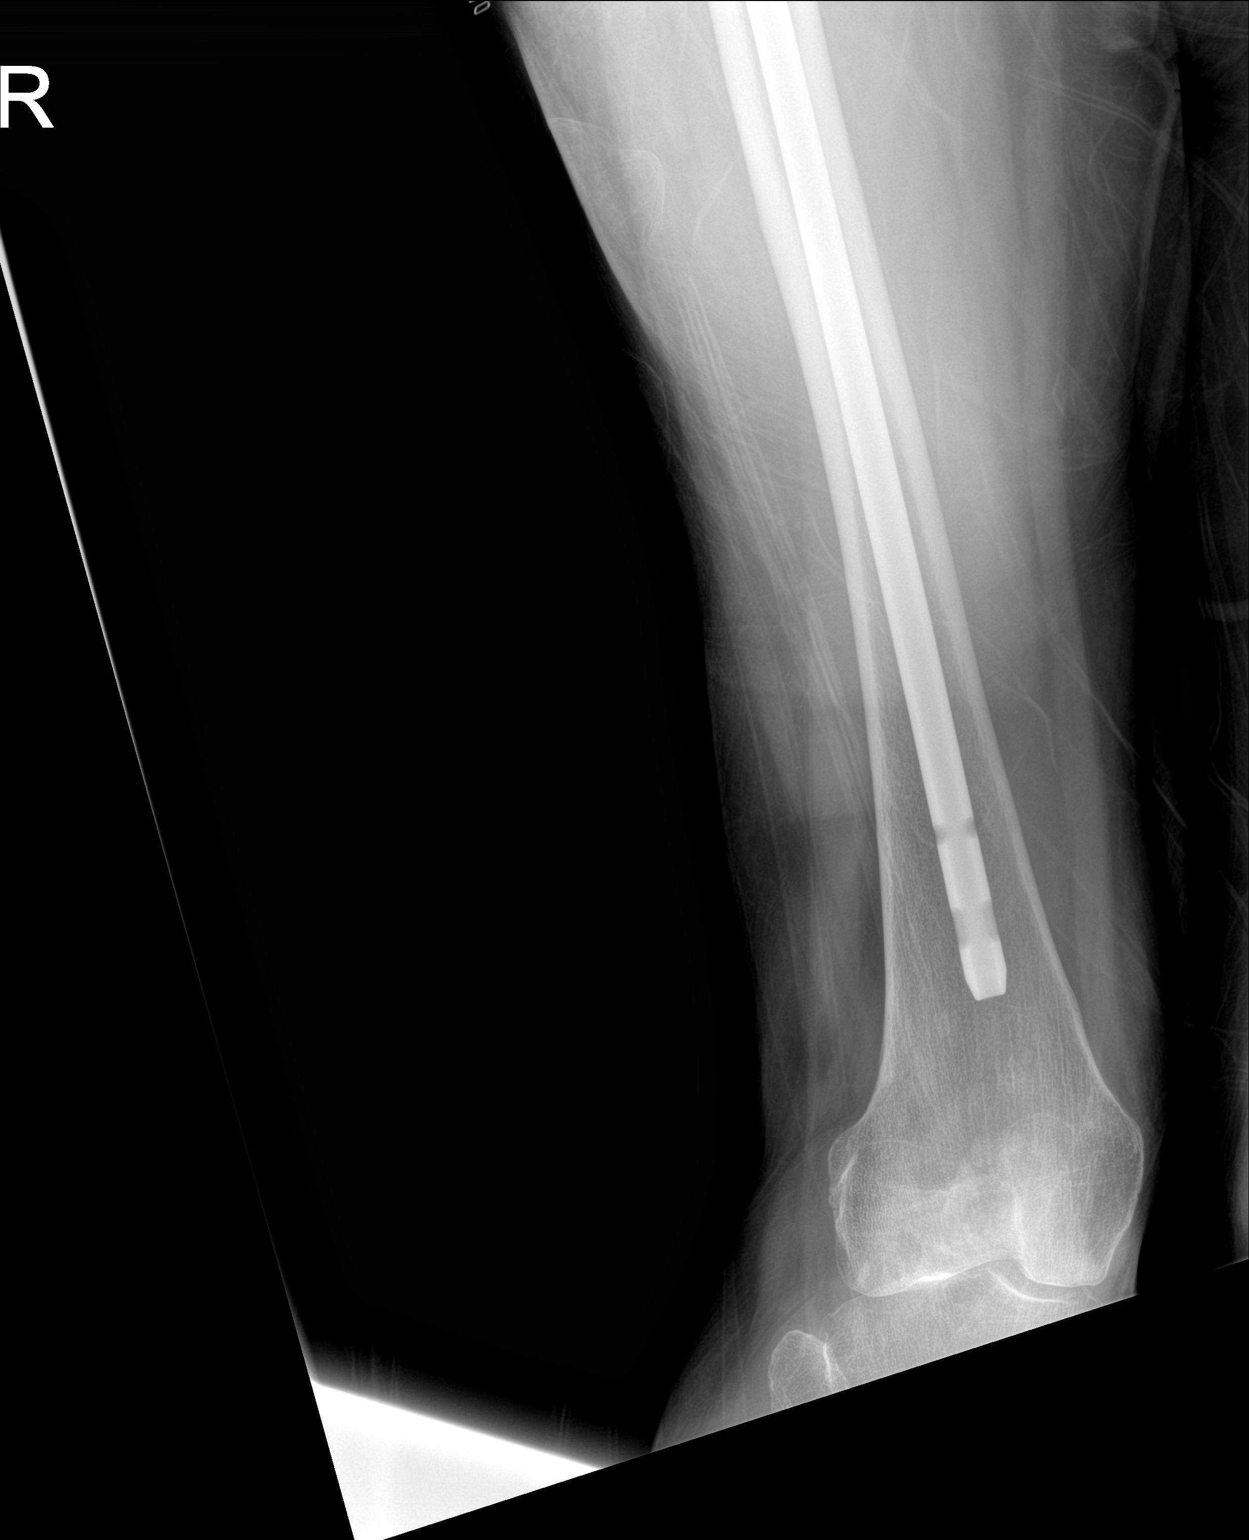

[2 of 2 positions shown; findings below may reference images not displayed]

FINDINGS: Frontal view obtained. There is screw and nail fixation through an
intertrochanteric femur fracture on the right with alignment
essentially anatomic at the fracture site. Screw tips in proximal
right femur. Nail remains within intramedullary portion of bone
throughout its course. No new fracture. No dislocation. Narrowing
right hip joint is stable. Bony overgrowth along the superolateral
aspect of the right acetabulum present.
IMPRESSION: Screw and nail fixation through fracture of the intertrochanteric
proximal right femur with alignment essentially anatomic in the area
of fracture on frontal view. Screw tips in proximal femur. Narrowing
right hip joint. Bony overgrowth along the superolateral aspect of
the right acetabulum potentially places patient at increased risk
for femoroacetabular impingement.

## 2022-02-09 IMAGING — DX DG HIP (WITH OR WITHOUT PELVIS) 2-3V*R*
4 series · 4 of 4 positions shown · non-contrast
Comparison: 01/06/2020

CLINICAL DATA: ORIF

EXAM:
DG HIP (WITH OR WITHOUT PELVIS) 2-3V RIGHT

[pelvis ap]
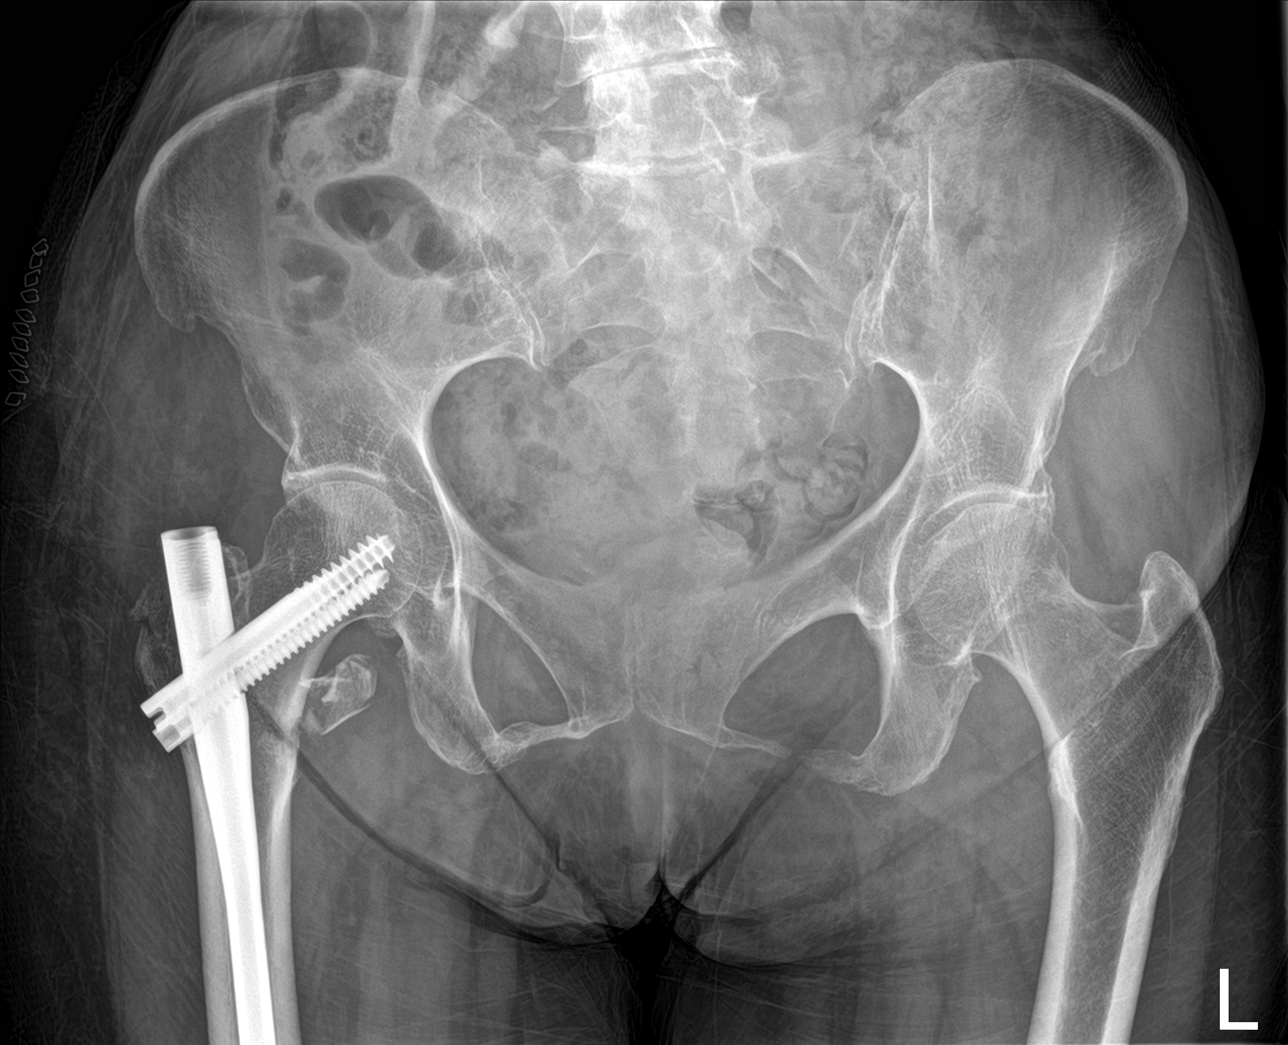

[hip ap (1 of 2)]
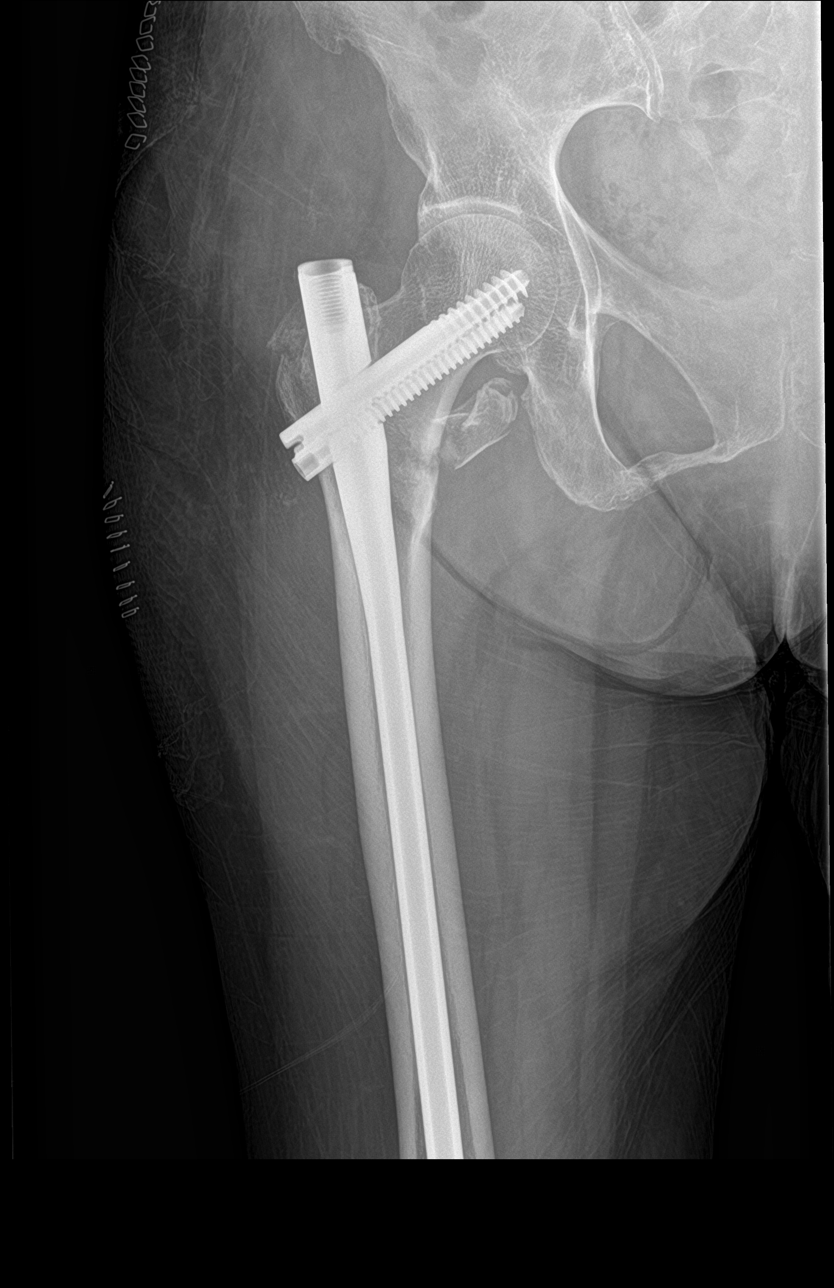

[hip lat]
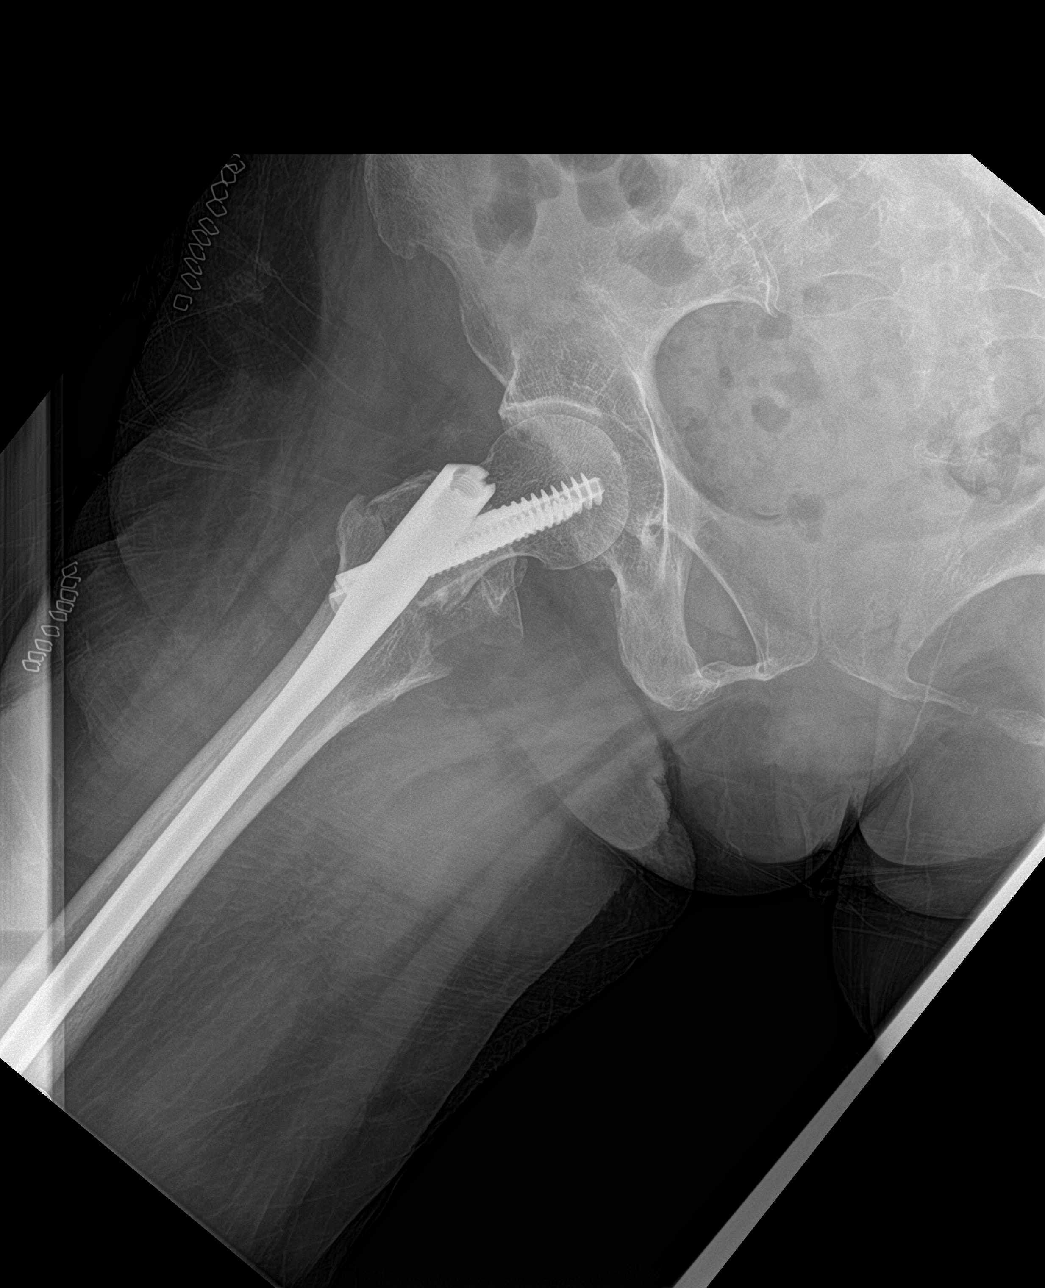

[hip ap (2 of 2)]
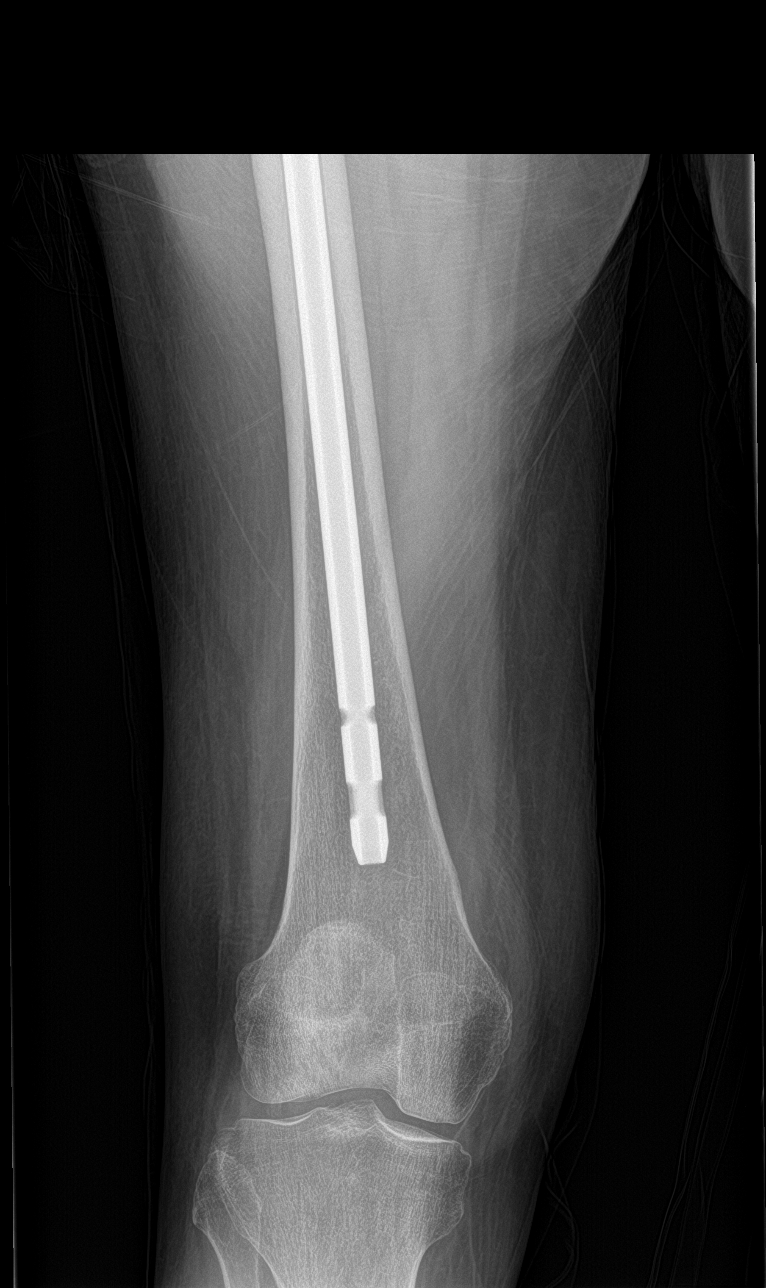

[4 of 4 positions shown; findings below may reference images not displayed]

FINDINGS: Changes of internal fixation across the proximal right femoral
fracture. No change in alignment. No hardware complicating feature.
IMPRESSION: No change in the postoperative appearance of the right femur.

## 2022-02-10 ENCOUNTER — Other Ambulatory Visit (HOSPITAL_COMMUNITY): Payer: Self-pay | Admitting: Physician Assistant

## 2022-04-17 ENCOUNTER — Ambulatory Visit (HOSPITAL_COMMUNITY): Payer: Medicare Other | Admitting: Physician Assistant

## 2022-06-11 ENCOUNTER — Ambulatory Visit (HOSPITAL_COMMUNITY)
Admission: RE | Admit: 2022-06-11 | Discharge: 2022-06-11 | Disposition: A | Discharge status: Home / Self Care | Payer: Medicare Other | Source: Ambulatory Visit | Attending: Physician Assistant | Admitting: Physician Assistant

## 2022-06-11 VITALS — BP 158/64 | HR 57 | Ht 64.0 in | Wt 120.4 lb

## 2022-06-11 DIAGNOSIS — R040 Epistaxis: Secondary | ICD-10-CM | POA: Insufficient documentation

## 2022-06-11 DIAGNOSIS — Z8249 Family history of ischemic heart disease and other diseases of the circulatory system: Secondary | ICD-10-CM | POA: Diagnosis not present

## 2022-06-11 DIAGNOSIS — Z79899 Other long term (current) drug therapy: Secondary | ICD-10-CM | POA: Diagnosis not present

## 2022-06-11 DIAGNOSIS — I341 Nonrheumatic mitral (valve) prolapse: Secondary | ICD-10-CM | POA: Insufficient documentation

## 2022-06-11 DIAGNOSIS — I484 Atypical atrial flutter: Secondary | ICD-10-CM

## 2022-06-11 DIAGNOSIS — I10 Essential (primary) hypertension: Secondary | ICD-10-CM | POA: Insufficient documentation

## 2022-06-11 DIAGNOSIS — Z7901 Long term (current) use of anticoagulants: Secondary | ICD-10-CM | POA: Diagnosis not present

## 2022-06-11 DIAGNOSIS — D6869 Other thrombophilia: Secondary | ICD-10-CM | POA: Diagnosis not present

## 2022-06-11 DIAGNOSIS — I4892 Unspecified atrial flutter: Secondary | ICD-10-CM | POA: Diagnosis present

## 2022-06-11 MED ORDER — METOPROLOL SUCCINATE ER 25 MG PO TB24: 25.0000 mg | ORAL_TABLET | Freq: Every day | ORAL | 1 refills | Status: DC

## 2022-06-11 NOTE — Progress Notes (Signed)
Primary Care Physician: Cleatis Polka., MD Primary Cardiologist: Dr Anne Fu (remotely) Primary Electrophysiologist: none Referring Physician: Ronney Lion NP   Alison Rivera is a 89 y.o. female with a history of HTN, OCD, MVP, atrial fibrillation who presents for follow up in the [facility]. The patient was initially diagnosed with atrial flutter 12/15/20 after presenting to her PCP with symptoms of tachypalpitations and mild SOB which had been ongoing for 2 weeks. Patient check on an Apple Watch which showed rapid heart rates. Her metoprolol was increased and she was started on Eliquis for a CHADS2VASC score of 4. There were no specific triggers that she could identify. She does drink alcohol occasionally. Patient is s/p DCCV on 01/05/21.   On follow up today, patient reports that she has been having frequent nosebleeds recently, as much as 2-3 times per week. They can last 10-15 minutes. She has been cutting her evening dose of Eliquis in half. Patient also reports that her "hair has been falling out" since increasing metoprolol. She denies any tachypalpitations since her last visit.   Today, she denies symptoms of palpitations, chest pain, orthopnea, PND, lower extremity edema, dizziness, presyncope, syncope, snoring, daytime somnolence, or neurologic sequela. The patient is tolerating medications without difficulties and is otherwise without complaint today.    Atrial Fibrillation Risk Factors:  she does not have symptoms or diagnosis of sleep apnea. she does not have a history of rheumatic fever. she does have a history of alcohol use. The patient does not have a history of early familial atrial fibrillation or other arrhythmias.  she has a BMI of Body mass index is 20.67 kg/m.Marland Kitchen Filed Weights   06/11/22 1409  Weight: 54.6 kg    Family History  Problem Relation Age of Onset   Other Mother        AD   Congestive Heart Failure Father     Syncope episode Father    Parkinson's disease Brother    Other Brother        Deep Brain Stimulator     Atrial Fibrillation Management history:  Previous antiarrhythmic drugs: none Previous cardioversions: 01/05/21 Previous ablations: none CHADS2VASC score: 4 Anticoagulation history: Eliquis   Past Medical History:  Diagnosis Date   Chest pain, atypical    Lichen planus    Mitral valve prolapse    Osteoporosis    Squamous cell skin cancer    Past Surgical History:  Procedure Laterality Date   CARDIOVERSION N/A 01/05/2021   Procedure: CARDIOVERSION;  Surgeon: Nahser, Deloris Ping, MD;  Location: [facility];  Service: Cardiovascular;  Laterality: N/A;   im nail right hip Right 01/04/2020   INTRAMEDULLARY (IM) NAIL INTERTROCHANTERIC Right 01/04/2020   Procedure: INTRAMEDULLARY (IM) NAIL INTERTROCHANTRIC;  Surgeon: Tarry Kos, MD;  Location: [facility] OR;  Service: Orthopedics;  Laterality: Right;   NOSE SURGERY      Current Outpatient Medications  Medication Sig Dispense Refill   cholecalciferol (VITAMIN D) 25 MCG tablet Take 1 tablet (1,000 Units total) by mouth daily. 30 tablet 0   ELIQUIS 2.5 MG TABS tablet TAKE 1 TABLET BY MOUTH TWICE A DAY 60 tablet 11   hydrocortisone 2.5 % cream Apply 1 application topically 2 (two) times daily as needed (irritation).     Multiple Vitamins-Minerals (CENTRUM SILVER) tablet Take 1 tablet by mouth in the morning.     sertraline (ZOLOFT) 50 MG tablet Take 1 tablet (50 mg total) by mouth daily. 30 tablet 0  zolpidem (AMBIEN) 10 MG tablet Take 5 mg by mouth at bedtime.     metoprolol succinate (TOPROL-XL) 25 MG 24 hr tablet Take 1 tablet (25 mg total) by mouth daily. 180 tablet 1   No current facility-administered medications for this encounter.    Allergies  Allergen Reactions   Alendronate Other (See Comments)    GI upset    Social History   Socioeconomic History   Marital status: Married    Spouse name: Not on file   Number of  children: Not on file   Years of education: Not on file   Highest education level: Not on file  Occupational History   Not on file  Tobacco Use   Smoking status: Never   Smokeless tobacco: Never  Vaping Use   Vaping Use: Never used  Substance and Sexual Activity   Alcohol use: Yes    Alcohol/week: 5.0 standard drinks of alcohol    Types: 5 Glasses of wine per week   Drug use: No   Sexual activity: Not on file  Other Topics Concern   Not on file  Social History Narrative   Not on file   Social Determinants of Health   Financial Resource Strain: Not on file  Food Insecurity: Not on file  Transportation Needs: Not on file  Physical Activity: Not on file  Stress: Not on file  Social Connections: Not on file  Intimate Partner Violence: Not on file     ROS- All systems are reviewed and negative except as per the HPI above.  Physical Exam: Vitals:   06/11/22 1409  BP: (!) 158/64  Pulse: (!) 57  Weight: 54.6 kg  Height: 5\' 4"  (1.626 m)     GEN- The patient is a well appearing elderly female, alert and oriented x 3 today.   HEENT-head normocephalic, atraumatic, sclera clear, conjunctiva pink, hearing intact, trachea midline. Lungs- Clear to ausculation bilaterally, normal work of breathing Heart- Regular rate and rhythm, no murmurs, rubs or gallops  GI- soft, NT, ND, + BS Extremities- no clubbing, cyanosis, or edema MS- no significant deformity or atrophy Skin- no rash or lesion Psych- euthymic mood, full affect Neuro- strength and sensation are intact   Wt Readings from Last 3 Encounters:  06/11/22 54.6 kg  04/18/21 52.4 kg  01/17/21 51.3 kg    EKG today demonstrates  SR, 1st degree AV block Vent. rate 57 BPM PR interval 274 ms QRS duration 88 ms QT/QTcB 426/414 ms  Echo 01/24/21 demonstrated   1. Left ventricular ejection fraction, by estimation, is 55 to 60%. The  left ventricle has normal function. The left ventricle has no regional  wall motion  abnormalities. There is mild concentric left ventricular  hypertrophy. Left ventricular diastolic parameters are indeterminate. Elevated left ventricular end-diastolic pressure.   2. Right ventricular systolic function is normal. The right ventricular  size is normal. There is normal pulmonary artery systolic pressure.   3. Left atrial size was mildly dilated.   4. The pericardial effusion is posterior to the left ventricle.   5. The mitral valve is abnormal. Mild to moderate mitral valve  regurgitation.   6. The aortic valve is tricuspid. There is mild calcification of the  aortic valve. There is mild thickening of the aortic valve. Aortic valve  regurgitation is mild to moderate. Aortic valve sclerosis/calcification is  present, without any evidence of  aortic stenosis.   7. The inferior vena cava is normal in size with greater than 50%  respiratory variability, suggesting right atrial pressure of 3 mmHg.  Epic records are reviewed at length today  CHA2DS2-VASc Score = 4  The patient's score is based upon: CHF History: 0 HTN History: 1 Diabetes History: 0 Stroke History: 0 Vascular Disease History: 0 Age Score: 2 Gender Score: 1        ASSESSMENT AND PLAN: 1. Atrial flutter The patient's CHA2DS2-VASc score is 4, indicating a 4.8% annual risk of stroke.   Patient appears to be maintaining SR.  Continue Eliquis 2.5 mg BID (age >74, weight < 60kg). Stressed importance of taking as prescribed. Will refer to ENT to discuss epistaxis.  Patient due for lab work with PCP on 5/10. Decrease Toprol to 25 mg daily Apple Watch for home monitoring.   2. Secondary Hypercoagulable State (ICD10:  D68.69) The patient is at significant risk for stroke/thromboembolism based upon her CHA2DS2-VASc Score of 4.  Continue Apixaban (Eliquis).   3. HTN Med changes as above. Patient will keep BP log for review with PCP next week.   4. VHD Mild-moderate MR, no clear prolapse. Mild-moderate  AR   Follow up in the AF clinic in one year.    Jorja Loa PA-C Af[facility] [address] [place], [place] [address] [phone] 06/11/2022 2:52 PM

## 2022-06-11 NOTE — Patient Instructions (Signed)
Decrease metoprolol to 25mg  once a day  Take Eliquis 2.5mg  twice a day  Follow up one year

## 2022-06-25 ENCOUNTER — Other Ambulatory Visit (HOSPITAL_COMMUNITY): Payer: Self-pay | Admitting: Physician Assistant

## 2022-10-29 ENCOUNTER — Other Ambulatory Visit (HOSPITAL_COMMUNITY): Payer: Self-pay | Admitting: Physician Assistant

## 2023-07-12 ENCOUNTER — Other Ambulatory Visit (HOSPITAL_COMMUNITY): Payer: Self-pay | Admitting: Physician Assistant

## 2023-08-06 ENCOUNTER — Other Ambulatory Visit (HOSPITAL_COMMUNITY): Payer: Self-pay | Admitting: Physician Assistant

## 2023-09-10 ENCOUNTER — Other Ambulatory Visit (HOSPITAL_COMMUNITY): Payer: Self-pay | Admitting: Physician Assistant

## 2023-10-17 ENCOUNTER — Other Ambulatory Visit (HOSPITAL_COMMUNITY): Payer: Self-pay | Admitting: Physician Assistant

## 2023-10-21 ENCOUNTER — Other Ambulatory Visit (HOSPITAL_COMMUNITY): Payer: Self-pay | Admitting: Physician Assistant

## 2023-10-28 ENCOUNTER — Other Ambulatory Visit (HOSPITAL_COMMUNITY): Payer: Self-pay | Admitting: *Deleted

## 2023-10-28 MED ORDER — METOPROLOL SUCCINATE ER 25 MG PO TB24: 25.0000 mg | ORAL_TABLET | Freq: Every day | ORAL | 0 refills | Status: DC

## 2023-11-06 ENCOUNTER — Ambulatory Visit (HOSPITAL_COMMUNITY)
Admission: RE | Admit: 2023-11-06 | Discharge: 2023-11-06 | Disposition: A | Discharge status: Home / Self Care | Source: Ambulatory Visit | Attending: Physician Assistant | Admitting: Physician Assistant

## 2023-11-06 VITALS — BP 140/64 | HR 62 | Ht 64.0 in | Wt 114.2 lb

## 2023-11-06 DIAGNOSIS — I484 Atypical atrial flutter: Secondary | ICD-10-CM

## 2023-11-06 DIAGNOSIS — I4891 Unspecified atrial fibrillation: Secondary | ICD-10-CM

## 2023-11-06 DIAGNOSIS — D6869 Other thrombophilia: Secondary | ICD-10-CM | POA: Diagnosis not present

## 2023-11-06 NOTE — Patient Instructions (Addendum)
 Dabigatran (Pradaxa)   Follow up 1 year

## 2023-11-06 NOTE — Progress Notes (Signed)
 Primary Care Physician: Loreli Elsie JONETTA Mickey., MD Primary Cardiologist: Dr Jeffrie (remotely) Primary Electrophysiologist: none Referring Physician: Corean Bohr NP   Alison Rivera is a 89 y.o. female with a history of HTN, OCD, MVP, atrial fibrillation who presents for follow up in the [facility]. The patient was initially diagnosed with atrial flutter 12/15/20 after presenting to her PCP with symptoms of tachypalpitations and mild SOB which had been ongoing for 2 weeks. Patient check on an Apple Watch which showed rapid heart rates. Her metoprolol  was increased and she was started on Eliquis  for stroke prevention. There were no specific triggers that she could identify. She does drink alcohol occasionally. Patient is s/p DCCV on 01/05/21.   Patient returns for follow up for atrial fibrillation. She reports that she has done well since her last visit with no interim symptoms of afib. Her nose bleeds resolved without intervention.   Today, she  denies symptoms of palpitations, chest pain, shortness of breath, orthopnea, PND, lower extremity edema, dizziness, presyncope, syncope, snoring, daytime somnolence, bleeding, or neurologic sequela. The patient is tolerating medications without difficulties and is otherwise without complaint today.    Atrial Fibrillation Risk Factors:  she does not have symptoms or diagnosis of sleep apnea. she does not have a history of rheumatic fever. she does have a history of alcohol use. The patient does not have a history of early familial atrial fibrillation or other arrhythmias.  Atrial Fibrillation Management history:  Previous antiarrhythmic drugs: none Previous cardioversions: 01/05/21 Previous ablations: none Anticoagulation history: Eliquis    Past Medical History:  Diagnosis Date   Chest pain, atypical    Lichen planus    Mitral valve prolapse    Osteoporosis    Squamous cell skin cancer     Current  Outpatient Medications  Medication Sig Dispense Refill   cholecalciferol  (VITAMIN D ) 25 MCG tablet Take 1 tablet (1,000 Units total) by mouth daily. 30 tablet 0   ELIQUIS  2.5 MG TABS tablet TAKE 1 TABLET BY MOUTH TWICE A DAY 60 tablet 11   hydrocortisone 2.5 % cream Apply 1 application topically 2 (two) times daily as needed (irritation).     metoprolol  succinate (TOPROL -XL) 25 MG 24 hr tablet Take 1 tablet (25 mg total) by mouth daily. Appointment Required For Further Refills [id number] 30 tablet 0   sertraline  (ZOLOFT ) 100 MG tablet Take 100 mg by mouth daily.     zolpidem  (AMBIEN ) 10 MG tablet Take 5 mg by mouth at bedtime.     No current facility-administered medications for this encounter.    ROS- All systems are reviewed and negative except as per the HPI above.  Physical Exam: Vitals:   11/06/23 1358  BP: (!) 140/64  Pulse: 62  Weight: 51.8 kg  Height: 5' 4 (1.626 m)     GEN: Well nourished, well developed in no acute distress CARDIAC: Regular rate and rhythm, no rubs, gallops. 1-2/6 systolic murmur  RESPIRATORY:  Clear to auscultation without rales, wheezing or rhonchi  ABDOMEN: Soft, non-tender, non-distended EXTREMITIES:  No edema; No deformity    Wt Readings from Last 3 Encounters:  11/06/23 51.8 kg  06/11/22 54.6 kg  04/18/21 52.4 kg    EKG today demonstrates  SR, 1st degree AV block, LAFB Vent. rate 62 BPM PR interval 262 ms QRS duration 88 ms QT/QTcB 430/436 ms   Echo 01/24/21 demonstrated   1. Left ventricular ejection fraction, by estimation, is 55 to 60%. The  left  ventricle has normal function. The left ventricle has no regional  wall motion abnormalities. There is mild concentric left ventricular  hypertrophy. Left ventricular diastolic parameters are indeterminate. Elevated left ventricular end-diastolic pressure.   2. Right ventricular systolic function is normal. The right ventricular  size is normal. There is normal pulmonary artery  systolic pressure.   3. Left atrial size was mildly dilated.   4. The pericardial effusion is posterior to the left ventricle.   5. The mitral valve is abnormal. Mild to moderate mitral valve  regurgitation.   6. The aortic valve is tricuspid. There is mild calcification of the  aortic valve. There is mild thickening of the aortic valve. Aortic valve  regurgitation is mild to moderate. Aortic valve sclerosis/calcification is  present, without any evidence of  aortic stenosis.   7. The inferior vena cava is normal in size with greater than 50%  respiratory variability, suggesting right atrial pressure of 3 mmHg.  Epic records are reviewed at length today   CHA2DS2-VASc Score = 4  The patient's score is based upon: CHF History: 0 HTN History: 1 Diabetes History: 0 Stroke History: 0 Vascular Disease History: 0 Age Score: 2 Gender Score: 1       ASSESSMENT AND PLAN: Atrial flutter The patient's CHA2DS2-VASc score is 4, indicating a 4.8% annual risk of stroke.   Patient appears to be maintaining SR Continue Eliquis  2.5 mg BID Continue Toprol  25 mg daily Apple Watch for home monitoring.   Secondary Hypercoagulable State (ICD10:  D68.69) The patient is at significant risk for stroke/thromboembolism based upon her CHA2DS2-VASc Score of 4.  Continue Apixaban  (Eliquis ). No bleeding issues. She asks about generic medications and we discussed warfarin and dabigatran. She will check with her insurance to see if dabigatran would be cheaper.   HTN Stable on current regimen  VHD Mild to moderate MR Mild to moderate AR   Follow up in the AF clinic in one year.    Daril Kicks PA-C Afib Clinic [facility] [address] [place], [place] [address] [phone] 11/06/2023 2:13 PM

## 2023-11-26 ENCOUNTER — Other Ambulatory Visit (HOSPITAL_COMMUNITY): Payer: Self-pay | Admitting: Physician Assistant
